# Patient Record
Sex: Male | Born: 1985 | Race: White | Hispanic: No | Marital: Married | Smoking: Never smoker
Health system: Southern US, Community
[De-identification: ages and names within clinical notes are randomized; demographics above are authoritative.]

## PROBLEM LIST (undated history)

## (undated) DIAGNOSIS — F32A Depression, unspecified: Secondary | ICD-10-CM

## (undated) DIAGNOSIS — B019 Varicella without complication: Secondary | ICD-10-CM

## (undated) DIAGNOSIS — E11319 Type 2 diabetes mellitus with unspecified diabetic retinopathy without macular edema: Secondary | ICD-10-CM

## (undated) DIAGNOSIS — E119 Type 2 diabetes mellitus without complications: Secondary | ICD-10-CM

## (undated) DIAGNOSIS — F419 Anxiety disorder, unspecified: Secondary | ICD-10-CM

## (undated) DIAGNOSIS — F329 Major depressive disorder, single episode, unspecified: Secondary | ICD-10-CM

## (undated) DIAGNOSIS — F509 Eating disorder, unspecified: Secondary | ICD-10-CM

## (undated) HISTORY — PX: LASIK: SHX215

## (undated) HISTORY — DX: Major depressive disorder, single episode, unspecified: F32.9

## (undated) HISTORY — DX: Varicella without complication: B01.9

## (undated) HISTORY — DX: Anxiety disorder, unspecified: F41.9

## (undated) HISTORY — PX: WISDOM TOOTH EXTRACTION: SHX21

## (undated) HISTORY — DX: Type 2 diabetes mellitus with unspecified diabetic retinopathy without macular edema: E11.319

## (undated) HISTORY — DX: Depression, unspecified: F32.A

## (undated) HISTORY — PX: APPENDECTOMY: SHX54

## (undated) HISTORY — DX: Eating disorder, unspecified: F50.9

---
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
## 2012-06-26 ENCOUNTER — Ambulatory Visit (INDEPENDENT_AMBULATORY_CARE_PROVIDER_SITE_OTHER): Payer: Self-pay | Admitting: Family Medicine

## 2012-06-26 VITALS — Wt 174.0 lb

## 2012-06-26 DIAGNOSIS — E119 Type 2 diabetes mellitus without complications: Secondary | ICD-10-CM

## 2012-06-26 NOTE — Progress Notes (Signed)
Patient presents today for 3 mo T1DM follow-up through the employer-sponsored [employer] program. Current DM regimen includes novolog via insulin pump. Patient continues using the Tandem T:slim pump, One Touch Verio meter, and Dexcom CGM. Patient recently began seeing a new endocrinologist, Dr. Sharl Ma. Initial visit was this past January, pt will return to endo today for follow-up. MD is requesting to see pt more often while making insulin adjustments and while getting acquainted with new office and staff. No new medications at this time. No med issues.   Diabetes Review: Highest CBG low 300s; Lowest CBG 60; Type of Diabetes: Type 1; Most recent A1c - 6.4 via Dr. Daune Perch office in mid January.   Other Diabetes History: Pt did not bring his meter in with him today, but reports the above highs and lows. Patient attributes his recent hyperglycemia to illness this past January. He also notes nighttime hypoglycemia in the 60s, which sometimes leads to spiking after correction. Patient continues testing 10-12 times daily, unless wearing the CGM, in this case he requires testing only 4-5 times daily. Dr. Sharl Ma is working with patient to adjust basal rates for tighter control and elimination of night time hypoglycemia. After adjusting basal rates, MD and pt will assess need for CHO ratio and ISF adjustments.   Patient does report one pump malfunction over the past 3 months. Pump motor discontinue working and had to be replaced. Approximately 5 days lapsed before patient was able to obtain a new pump. During this time pt transitioned to Lantus and Novolog injections.   Patient did mention that he will request to have his thyroid checked at todays at follow-up given his family hx of hypothyroidism (mother recently diagnosed) and increased risk due to being Type 1 diabetic.  Lifestyle Review: Exercise - no changes to exercise regimen, continues exercising ~5 days per week, biking and strength training.  Diet -  No changes, continues to maintain a healthy diet and continues carb counting.  Assessment:  Pt remains under great control with A1c of 6.4. Goal A1c per Dr. Sharl Ma is <7.0. Patient continues to manage his DM with tight control, maintain excellent exercise routine and diet. 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    Teaching Goals:   1) Continue testing regularly 2) Continue to maintain healthy diet and exercise 3)  Maintain follow-up with new endocrinologist 4) Follow-up in 3 months

## 2012-07-16 NOTE — Progress Notes (Signed)
ATTENDING PHYSICIAN NOTE: I have reviewed the chart and agree with the plan as detailed above. Sara Neal MD Pager [phone]  

## 2012-09-21 ENCOUNTER — Encounter (HOSPITAL_COMMUNITY): Payer: Self-pay

## 2012-09-21 ENCOUNTER — Emergency Department (HOSPITAL_COMMUNITY)
Admission: EM | Admit: 2012-09-21 | Discharge: 2012-09-21 | Disposition: A | Discharge status: Home / Self Care | Payer: 59 | Attending: Emergency Medicine | Admitting: Emergency Medicine

## 2012-09-21 DIAGNOSIS — R42 Dizziness and giddiness: Secondary | ICD-10-CM | POA: Insufficient documentation

## 2012-09-21 DIAGNOSIS — Z79899 Other long term (current) drug therapy: Secondary | ICD-10-CM | POA: Insufficient documentation

## 2012-09-21 DIAGNOSIS — R110 Nausea: Secondary | ICD-10-CM | POA: Insufficient documentation

## 2012-09-21 DIAGNOSIS — Z794 Long term (current) use of insulin: Secondary | ICD-10-CM | POA: Insufficient documentation

## 2012-09-21 DIAGNOSIS — R259 Unspecified abnormal involuntary movements: Secondary | ICD-10-CM | POA: Insufficient documentation

## 2012-09-21 DIAGNOSIS — E1069 Type 1 diabetes mellitus with other specified complication: Secondary | ICD-10-CM | POA: Insufficient documentation

## 2012-09-21 DIAGNOSIS — R61 Generalized hyperhidrosis: Secondary | ICD-10-CM | POA: Insufficient documentation

## 2012-09-21 DIAGNOSIS — R5381 Other malaise: Secondary | ICD-10-CM | POA: Insufficient documentation

## 2012-09-21 HISTORY — DX: Type 2 diabetes mellitus without complications: E11.9

## 2012-09-21 MED ORDER — DEXTROSE-NACL 5-0.9 % IV SOLN
Freq: Once | INTRAVENOUS | Status: AC
  Administered 2012-09-21: 14:00:00 via INTRAVENOUS

## 2012-09-21 NOTE — ED Provider Notes (Signed)
History     CSN: [id number]  Arrival date & time 09/21/12  1332   None     Chief Complaint  Patient presents with  . Hypoglycemia    (Consider location/radiation/quality/duration/timing/severity/associated sxs/prior treatment) HPI  27 year old male presents emergency department for hypoglycemia.  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  He has a history of type I insulin-dependent diabetes mellitus and uses an insulin pump.  The patient recently had his pump replaced after it was destroyed in radiology.  Patient states that he was reprogramming it to give him his basal dose this morning which is usually 0.9 units.  Patient states that his blood sugar kept dropping and when he took it off to go to radiology he noticed that it was set to 9 units per hour.  She did become diaphoretic and nauseated.  He was also very shaky.  Patient drinks several Coca-Cola as and came to the emergency department for monitoring.  He denies any other symptoms at this time.  No past medical history on file.  No past surgical history on file.  No family history on file.  History  Substance Use Topics  . Smoking status: Not on file  . Smokeless tobacco: Not on file  . Alcohol Use: Not on file      Review of Systems  Constitutional: Positive for diaphoresis. Negative for fever and chills.  Respiratory: Negative for cough and shortness of breath.   Cardiovascular: Negative for chest pain and palpitations.  Gastrointestinal: Positive for nausea. Negative for vomiting, abdominal pain, diarrhea and constipation.  Genitourinary: Negative for dysuria, urgency and frequency.  Musculoskeletal: Negative for myalgias and arthralgias.  Skin: Negative for rash.  Neurological: Positive for tremors, weakness and light-headedness. Negative for headaches.    Allergies  Review of patient's allergies indicates not on file.  Home Medications   Current Outpatient Rx  Name  Route  Sig   Dispense  Refill  . insulin aspart (NOVOLOG) 100 UNIT/ML injection   Subcutaneous   Inject into the skin. Use as directed via insulin pump         . sertraline (ZOLOFT) 50 MG tablet   Oral   Take 50 mg by mouth daily.           There were no vitals taken for this visit.  Physical Exam  Nursing note and vitals reviewed. Constitutional: He is oriented to person, place, and time. He appears well-developed and well-nourished. No distress.  Fit, well-appearing  HENT:  Head: Normocephalic and atraumatic.  Eyes: Conjunctivae are normal. No scleral icterus.  Neck: Normal range of motion. Neck supple.  Cardiovascular: Normal rate, regular rhythm and normal heart sounds.   Pulmonary/Chest: Effort normal and breath sounds normal. No respiratory distress.  Abdominal: Soft. There is no tenderness.  Musculoskeletal: He exhibits no edema.  Neurological: He is alert and oriented to person, place, and time.  Tremulous  Skin: Skin is warm and dry. He is not diaphoretic.  Psychiatric: His behavior is normal.    ED Course  Procedures (including critical care time)  Labs Reviewed - No data to display No results found.   No diagnosis found.    MDM  2:33 PM Filed Vitals:   09/21/12 1400  BP: 112/58  Pulse: 70  Temp:   Resp:    Patient here for hypoglycemia 2 to insulin overdose from Mis-programming of his insulin pump.  Patient has removed his insulin pump and is receiving D50 infusion.  He is currently eating his 1 chest well.  He is on blood glucose monitoring.  Last check was 79 mg per decaliter.  We'll continue to monitor the patient's blood sugar.   CBG (last 3)   Recent Labs  09/21/12 1343 09/21/12 1452 09/21/12 1626  GLUCAP 79 206* 294*    Patient blood glucose appears to be stabilizing.He has restarted his basal insulin. No tremors, N/V. Will d.c home and f/u with PCP. The patient appears reasonably screened and/or stabilized for discharge and I doubt any other  medical condition or other [facility] requiring further screening, evaluation, or treatment in the ED at this time prior to discharge.     Arthor Captain, PA-C 09/21/12 1643

## 2012-09-21 NOTE — ED Notes (Signed)
Pt states he set his insulin pump too high today by accident. C/o diaphoresis, general malaise. Pt drank several cokes today to keep up blood sugar.

## 2012-09-21 NOTE — ED Notes (Addendum)
Pt states his cbg is trending down on his pump. Resting comfortably, eating lunch. D5% set to bolus per abigail, PA

## 2012-09-22 NOTE — ED Provider Notes (Signed)
Medical screening examination/treatment/procedure(s) were performed by non-physician practitioner and as supervising physician I was immediately available for consultation/collaboration.   Staisha Winiarski L Trayshawn Durkin, MD 09/22/12 1247 

## 2012-10-10 ENCOUNTER — Ambulatory Visit (INDEPENDENT_AMBULATORY_CARE_PROVIDER_SITE_OTHER): Payer: Self-pay | Admitting: Family Medicine

## 2012-10-10 VITALS — BP 106/68 | Wt 164.0 lb

## 2012-10-10 DIAGNOSIS — E119 Type 2 diabetes mellitus without complications: Secondary | ICD-10-CM

## 2012-10-10 LAB — BASIC METABOLIC PANEL
BUN: 13 (ref 4–21)
Creatinine: 0.9 (ref 0.6–1.3)
GLUCOSE: 144
Sodium: 140 (ref 137–147)

## 2012-10-10 LAB — TSH: TSH: 1.98 (ref 0.41–5.90)

## 2012-10-10 NOTE — Progress Notes (Signed)
Patient presents today for 3 month T1DM follow-up as part of the employer-sponsored [employer] to Well[employer] program. Patient continues being followed by Dr. Sharl Ma, endo, and is pleased with his care thus far. He will follow-up with Dr. Sharl Ma today after this visit. No med changes at this time. Patient's thyroid function tests revealed hypothyroidism, but treatment has not yet been initiated. MD will continue to follow and will discuss treatment options with patient as this becomes necessary. Patient is asymptomatic at this time.   Diabetes Assessment: Diabetes: Year of diagnosis 3 yrs of age; checks feet daily; takes medications as prescribed; uses glucometer; does not take an aspirin a day; checks blood glucose more than 4 times a day 10-12 times per day. Checking less while wearing CGM.; hypoglycemia frequency infrequent; MD managing Diabetes Dr. Sharl Ma, endo; Type of Diabetes: Type 1; Highest CBG 260; Lowest CBG 60s; Sees Diabetes provider 3 times per year This varies, normally sees MD q4 mo, but sometimes more often, sometimes less often. Other Diabetes History: Current regimen includes novolog via pump. Patient has recently switched to Medtronic pump and is no longer using Tandem T:slim. Patient has experienced multiple malfunctions of the t-slim pump including motor failure, system shut-down causing loss of all basal rates, and most recently inadequate alerts for basal rate error. About 3 weeks ago, patient adjusted basal rate as instructed by MD to 0.9 units per hour, in error the rate was entered as 9 units per hour and pump did not provide an alert or auto-stop feature indicating this was an innaccurate rate. As a result patient began experiencing hypoglycemic episodes, disconnected pump for ~1 hour and began treating the hypoglycemia, glucose continued to drop after re-connecting pump, patient attempted to verify rate and at this time discovered the incorrect basal rate. He was excorted to the ED (patient is a  Insurance underwriter at [employer]) and was treated there with IV dextrose. Patient is not pleased with quality of t-slim pump and has been working with their reps and engineers to improve the build of this pump. As a result of this error, he has opted to switch to Medtronic which offers a pump with alerts and even auto-shut off for basal rates in excess of 2 units per hour. Thus far patient is pleased with this new pump. He also continues using Dexcom CGM. Glucose monitoring occurs 10-12 times daily when not using CGM and 4-6 times daily when wearing CGM. Patient did not bring meter in today but reports highest reading of 260 over previous month and lowest reading (other than due to pump error) of 60s. Patient is aware of how to appropriately correct hypoglycemia. MD, Dr. Sharl Ma continues working with patient to adjust basal rates, no changes to ISF or carb ratio at this time. Patient is due for yearly eye exam and will schedule an appt soon. He generally has eye exam yearly around his birthdate.   Lifestyle Factors: Exercise - no changes to exercise regimen, continues exercising ~5 days per week, biking and strength training.  Diet - No changes, continues to maintain a healthy diet and carb counting.  Assessment: Pt remains under great control and will have A1c checked today at follow-up with Dr. Sharl Ma. Goal A1c per Dr. Sharl Ma is <7.0. Patient continues to manage his DM with tight control, maintain excellent exercise routine and diet. 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   Plan: 1) Continue to maintain exercise regimen 2) Continue to maintain a healthy diet 3) Continue testing regularly  4) Schedule an eye exam 5) Follow-up in 3 months on Wednesday August 27th @ 9:00 am

## 2012-11-05 NOTE — Progress Notes (Signed)
Patient ID: Mark Barr, male   DOB: 12-11-1985, 27 y.o.   MRN: [id number] ATTENDING PHYSICIAN NOTE: I have reviewed the chart and agree with the plan as detailed above. Denny Levy MD Pager [phone]

## 2012-11-27 ENCOUNTER — Encounter: Payer: Self-pay | Admitting: *Deleted

## 2012-11-27 ENCOUNTER — Encounter: Payer: 59 | Attending: Internal Medicine | Admitting: *Deleted

## 2012-11-27 VITALS — Ht 69.0 in | Wt 166.8 lb

## 2012-11-27 DIAGNOSIS — E119 Type 2 diabetes mellitus without complications: Secondary | ICD-10-CM

## 2012-11-27 DIAGNOSIS — Z713 Dietary counseling and surveillance: Secondary | ICD-10-CM | POA: Insufficient documentation

## 2012-11-27 DIAGNOSIS — Z9641 Presence of insulin pump (external) (internal): Secondary | ICD-10-CM | POA: Insufficient documentation

## 2012-11-27 DIAGNOSIS — E109 Type 1 diabetes mellitus without complications: Secondary | ICD-10-CM | POA: Insufficient documentation

## 2012-11-27 DIAGNOSIS — F509 Eating disorder, unspecified: Secondary | ICD-10-CM

## 2012-11-27 NOTE — Progress Notes (Signed)
Patient was seen on 11/27/12 for nutrition counseling pertaining to disordered eating  1990 spencer was diagnosed with Type 1 diabetes.  Currently on Animus pump.  Most recent HbA1c is 6.7%.  Diabetes is well-controlled, although current A1c is higher than previous of 6.4%.    Primary care MD: none, Dr. Sharl Ma for endocrinology. 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  [name] went to [employer] for football.  His eating disorder began in college Previous treatments: Saw therapist regularly in college and saw RD around the same time.  These were positive experiences.  The RD initiated food challenges and intuitive eating.  [name] likes to eat more on a schedule, regardless of his hunger/satiety cues.  There has been about a 2 year gap since [place] received any type of treatment for his ED and lately things in his life have spiraled out of control. Since working with Noni Saupe, he has reduced his purging episodes and is open to receiving treatment for his ED.  He currently doesn't have an PCP and he is looking for one.  He also is thinking about switching endocrinologist to Dr. Elvera Lennox after he has paid for his new pump with Dr. Sharl Ma Goals for RD meetings: to fix all this.  He realizes it will take time to heal and restore weight.  He would like to know how much he needs to eat to provide fuel for his exercise  Weight history:  Highest weight: 200-205 lb when playing football  Lowest weight: 145 lb  Most consistent weight: 160-165    What would you like to weight:160 How has weight changed in the past year: gained muscle mass when he started weight training, then dropped 10 pounds in 3 months.  His lowest was 158.  His endocrinologist stated something to the effect that [name]'s weight of 175 (put on a lot of muscle mass from weight training)  was too much for his frame and his BMI was too high.  [name] then dropped 10 pound and Dr. Sharl Ma was pleased.  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  [name] states that he receives regular medical care from Dr. Sharl Ma.  He denies any adverse symptoms, but he has not had regular lab work.  He presents today with dry, cracked lips which could be indicative of malnutrition/dehydration Changes in hair, skin, nails since ED started: NA Sometimes has ulcer as result of purging and episodes of fatigue as a result of negative energy balance Chewing/swallowing difficulties: NA Relux or heartburn: acid reflux with heavy meal.  Not currently on medication.  nothing OTC Trouble with teeth: not due to ED, but does grind teeth.   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  With water.   2 pm: peanut butter sandwich or ham and cheese sandwich.  Cup of sunchips.  Apple or grapes.  Carrots and maybe fruit snacks Might have sprite  Or juice if hypoglycemic 8:30 pm: varies- roast beef with 1/2 cup rice;  Grilled chicken.  Maybe casserole or might grill meat  Might have pack of crackers if hypoglycemic  When not working: B: Bagel and eggs S: Some type of snacks: fruits, peanut butter cracker,  trail mix L: Sandwich and fruit S: Cliff bar or another bar while riding (3 hr on mountain bike 27 mi) D: Dinner varies S: fudgesicle   Dinner time is the hardest because it's a bigger meal and he might not have exercised.  He has less control over dinner.  He is more anxious when his wife cooks.  He will deliberatly undercook food so he can't eat much  Compensatory behaviors           Restricting (calories, fat, carbs)  SIV: 3-4 times in 2 weeks recent.  Used to purge BID  Diet pills: no  Laxatives: no  Diuretics: no  Alcohol or drugs: none  Exercise (what type):  Run, bike, lift weights  6-7 days.  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  As evidenced by dietary recall.  Intervention: Discussed outpatient ED treatment: regular visits to therapist, dietitian, and medical doctor.  Since [place] doesn't have a PCP currently, he will need to find one for monthly lab workup.  Discussed calorie needs vs what he is actually consuming and discussed needs for weight restoration.  Suggested adding calories in 150 kcal/day- 300 kcal/day intervals.  Gave food log for work days and off days to track binge/purge episodes.  Suggested protein snacks during the morning and afternoon.  Suggested cheese, nuts, bars, etc.  Nuri also agreed to cut back on his workout to walking in the morning, instead of running.    Monitoring and Evaluation: Patient will follow up in 1 weeks.

## 2012-11-27 NOTE — Patient Instructions (Addendum)
Ride with dad instead of group at slower pace. Walk for cardio on work days instead of running.  continue lifting as reported.    Continue snacking on almonds.  10-15 every couple of times Somehow try to get snack in around 4-5.  Include starch and protein  Keep food/exercise log for 2 work days and 2 off days

## 2012-12-05 ENCOUNTER — Encounter: Payer: 59 | Admitting: *Deleted

## 2012-12-05 DIAGNOSIS — F509 Eating disorder, unspecified: Secondary | ICD-10-CM

## 2012-12-05 NOTE — Patient Instructions (Addendum)
Add string cheese, sliced cheese, Baby Bell on crackers- your choice Kefir -beverage in the yogurt aisle Almonds Beef jerky Greek yogurt Peanut Butter crackers Keep riding with dad Keep weight lifting Consider cutting back on cardio on weight days Aim for 6-7 hours of sleep each night Continue to drink water throughout the day  Keep fruit as a snack, but add nuts or cheese.  Aim for 2-3 snacks.

## 2012-12-05 NOTE — Progress Notes (Signed)
Patient was seen on 12/05/12 for follow up nutrition counseling pertaining to disordered eating  1990 Mark Barr was diagnosed with Type 1 diabetes.  Currently on Animus pump.  Most recent HbA1c is 6.7%.  Diabetes is well-controlled, although current A1c is higher than previous of 6.4%.    Primary care MD: none, Dr. Sharl Ma for endocrinology. 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  Mark Barr went to [employer] for football.  His eating disorder began in college Previous treatments: Saw therapist regularly in college and saw RD around the same time.  These were positive experiences.  The RD initiated food challenges and intuitive eating.  Mark Barr likes to eat more on a schedule, regardless of his hunger/satiety cues.  There has been about a 2 year gap since Mark Barr received any type of treatment for his ED and lately things in his life have spiraled out of control. Since working with Noni Saupe, he has reduced his purging episodes and is open to receiving treatment for his ED.  He currently doesn't have an PCP and he is looking for one.  He also is thinking about switching endocrinologist to Dr. Elvera Lennox after he has paid for his new pump with Dr. Sharl Ma Goals for RD meetings: to fix all this.  He realizes it will take time to heal and restore weight.  He would like to know how much he needs to eat to provide fuel for his exercise  Weight history:  Highest weight: 200-205 lb when playing football  Lowest weight: 145 lb  Most consistent weight: 160-165    What would you like to weight:160 How has weight changed in the past year: gained muscle mass when he started weight training, then dropped 10 pounds in 3 months.  His lowest was 158.  His endocrinologist stated something to the effect that Mark Barr's weight of 175 (put on a lot of muscle mass from weight  training) was too much for his frame and his BMI was too high.  Mark Barr then dropped 10 pound and Dr. Sharl Ma was pleased.  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  Mark Barr states that he receives regular medical care from Dr. Sharl Ma.  He denies any adverse symptoms, but he has not had regular lab work.  He presents today with dry, cracked lips which could be indicative of malnutrition/dehydration Changes in hair, skin, nails since ED started: NA Sometimes has ulcer as result of purging and episodes of fatigue as a result of negative energy balance Chewing/swallowing difficulties: NA Relux or heartburn: acid reflux with heavy meal.  Not currently on medication.  nothing OTC Trouble with teeth: not due to ED, but does grind teeth.   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  With water.   2 pm: peanut butter sandwich or ham and cheese sandwich.  Cup of sunchips.  Apple or grapes.  Carrots and maybe fruit snacks Might have sprite  Or juice if hypoglycemic 8:30 pm: varies- roast beef with 1/2 cup rice;  Grilled chicken.  Maybe casserole or might grill meat  Might have pack of crackers if hypoglycemic  When not working: B: Bagel and eggs S: Some type of snacks: fruits, peanut butter  cracker, trail mix L: Sandwich and fruit S: Cliff bar or another bar while riding (3 hr on mountain bike 29 mi) D: Dinner varies S: fudgesicle   Dinner time is the hardest because it's a bigger meal and he might not have exercised.  He has less control over dinner.  He is more anxious when his wife cooks.  He will deliberatly undercook food so he can't eat much  Compensatory behaviors           Restricting (calories, fat, carbs)  SIV: 1 time this week.  Used to purge BID  Diet pills: no  Laxatives: no  Diuretics: no  Alcohol or drugs: none  Exercise (what type):  Run, bike, lift weights  6-7 days.  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  As evidenced by dietary recall.  Intervention: Praised Luigi for his progress.  He has cut back on his exercise and his purging.  He has made good progress.  He had several situations where he could have purged or restricted and he didn't.  He added the almonds as a snack like we discussed and he denies negative thoughts or ramifications from increasing his intake.  He's really come a long way in a week :-)  Since Mark Barr still doesn't have a PCP currently, he will need to find one soon for monthly lab workup.  I got labs from Dr. Sharl Ma, but there isn't a complete metabolic panel; Dr. Sharl Ma is understandably more interested in Mark Barr's diabetes and his endocrine-related labs.    Discussed calorie needs vs what he is actually consuming and discussed needs for weight restoration.  Suggested adding calories in 150 kcal/day- 300 kcal/day intervals.    Suggested protein snacks during the morning and afternoon.  Suggested cheese, nuts, bars, yogurt,  etc.  [name] also agreed to cut back on his workout to walking in the morning, instead of running.  Encouraged him to find time to relax; he likes to read.  He is also going on vacation next week.  Encouraged him to make the time to relax and enjoy himself and to limit running to 2 days  Monitoring and Evaluation: Patient will follow up in 2 weeks.

## 2012-12-18 ENCOUNTER — Ambulatory Visit: Payer: 59 | Admitting: *Deleted

## 2012-12-19 ENCOUNTER — Encounter: Payer: 59 | Attending: Internal Medicine | Admitting: *Deleted

## 2012-12-19 VITALS — Ht 69.0 in | Wt 170.0 lb

## 2012-12-19 DIAGNOSIS — Z9641 Presence of insulin pump (external) (internal): Secondary | ICD-10-CM | POA: Insufficient documentation

## 2012-12-19 DIAGNOSIS — F509 Eating disorder, unspecified: Secondary | ICD-10-CM

## 2012-12-19 DIAGNOSIS — E109 Type 1 diabetes mellitus without complications: Secondary | ICD-10-CM | POA: Insufficient documentation

## 2012-12-19 DIAGNOSIS — Z713 Dietary counseling and surveillance: Secondary | ICD-10-CM | POA: Insufficient documentation

## 2012-12-19 NOTE — Progress Notes (Signed)
Patient was seen on 12/19/12 for follow up nutrition counseling pertaining to disordered eating  1990 Mark Barr was diagnosed with Type 1 diabetes.  Currently on Animus pump.  Most recent HbA1c is 6.7%.  Diabetes is well-controlled, although current A1c is higher than previous of 6.4%.    Primary care MD: Denton Ar at [facility]- has appt to establish primary care on 01/07/13 Endocrinologist: Dr. Sharl Ma currently.  Plans to switch to Dr. Elvera Lennox Therapist: Noni Saupe  Any other medical team members: unknown psychiatrist   3 purges last week, calories moderation if not able to exercise.   Assessment  Eating history:  Length of time: disordered eating began in college.  [name] went to [employer] for football.  His eating disorder began in college Previous treatments: Saw therapist regularly in college and saw RD around the same time.  These were positive experiences.  The RD initiated food challenges and intuitive eating.  [name] likes to eat more on a schedule, regardless of his hunger/satiety cues.  There has been about a 2 year gap since [place] received any type of treatment for his ED and lately things in his life have spiraled out of control. Since working with Noni Saupe, he has reduced his purging episodes and is open to receiving treatment for his ED.  He currently doesn't have an PCP and he is looking for one.  He also is thinking about switching endocrinologist to Dr. Elvera Lennox after he has paid for his new pump with Dr. Sharl Ma Goals for RD meetings: to fix all this.  He realizes it will take time to heal and restore weight.  He would like to know how much he needs to eat to provide fuel for his exercise  Weight history:  Highest weight: 200-205 lb when playing football  Lowest weight: 145 lb  Most consistent weight: 160-165    What would you like to weigh:160 How has weight changed in the past year: gained muscle mass when he started weight training, then dropped 10 pounds in 3  months.  His lowest was 158.  His endocrinologist stated something to the effect that Mark Barr's weight of 175 (put on a lot of muscle mass from weight training) was too much for his frame and his BMI was too high.  Mark Barr then dropped 10 pound and Dr. Sharl Ma was pleased.  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  He has made an appointment to establish primary care.  [name] states that he receives regular medical care from Dr. Sharl Ma.  He denies any adverse symptoms, but he has not had regular lab work.  He presents today with dry, cracked lips which could be indicative of malnutrition/dehydration Changes in hair, skin, nails since ED started: NA Sometimes has ulcer as result of purging and episodes of fatigue as a result of negative energy balance Chewing/swallowing difficulties: NA Relux or heartburn: acid reflux with heavy meal.  Not currently on medication.  nothing OTC Trouble with teeth: not due to ED, but does grind teeth.   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  With water.   2 pm: peanut butter sandwich or ham and cheese sandwich.  Cup of sunchips.  Apple or grapes.  Carrots and maybe fruit snacks Might have sprite  Or juice if hypoglycemic 8:30 pm: varies- roast beef  with 1/2 cup rice;  Grilled chicken.  Maybe casserole or might grill meat  Might have pack of crackers if hypoglycemic  When not working: B: Bagel and eggs S: Some type of snacks: fruits, peanut butter cracker, trail mix L: Sandwich and fruit S: Cliff bar or another bar while riding (3 hr on mountain bike 8 mi) D: Dinner varies S: fudgesicle   Dinner time is the hardest because it's a bigger meal and he might not have exercised.  He has less control over dinner.  He is more anxious when his wife cooks.  He will deliberatly undercook food so he can't eat much  Compensatory behaviors           Restricting (calories, fat, carbs)  SIV: 3 time this week.  Used to purge BID, purging varies each week  Diet pills: no  Laxatives: no  Diuretics: no  Alcohol or drugs: none  Exercise (what type):  Run, bike, lift weights  6-7 days.  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  As evidenced by dietary recall.  Intervention: Praised [place] for his progress.  He has cut back on his exercise and his purging. He was not able to exericse as much thi spast week due to the weather, but he did not try and force an exercise.  He did eat less and admits to purging this past week.  He has made good progress.  He had 2 situations where he could have purged or restricted and he didn't.  He believes that it was sheer will-power that stopped him on those days.  He added the almonds as a snack like we discussed and he denies negative thoughts or ramifications from increasing his intake.  He has hypoglycemia maybe once a day, but he tries to snack often to prevent this.  He mainly restricts his intake at dinner if he feels  like he hasn't exercise enough or ate too much that day  Discussed calorie needs vs what he is actually consuming and discussed needs for weight restoration.  Assured Matan that he will not become obese by adding in the nutrition he needs to be healthy.  Discussed intuitive eating and what the ultimate goals are with his relationship with food.  This will take time and patience, but he will get there.  He is motivated and educated and is honestly trying to move towards health.    Discussed the difference between his own thoughts and his ED thoughts.  He has two voices fighting against each other.  We need to make his positive voice louder than his ED voice.  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  His homework for this week is to ask Lowella Bandy what she likes abotu his appearance and his personality.  If he has disparaging thoughts, he must think of 3 affirming things.  If he is unable to think of 3 things, he's not allowed to say anything disparaging. Instead, tell that ED voice to shut up.   Distinguish health thoughts from negative, wrong thoughts.  Make that distinction and make the positive voice as loud as the negative one   Monitoring and Evaluation: Patient will follow up in 1 weeks.

## 2012-12-20 ENCOUNTER — Encounter: Payer: Self-pay | Admitting: *Deleted

## 2012-12-26 ENCOUNTER — Ambulatory Visit: Payer: 59 | Admitting: *Deleted

## 2013-01-01 ENCOUNTER — Encounter: Payer: 59 | Admitting: *Deleted

## 2013-01-01 DIAGNOSIS — E119 Type 2 diabetes mellitus without complications: Secondary | ICD-10-CM

## 2013-01-01 DIAGNOSIS — F509 Eating disorder, unspecified: Secondary | ICD-10-CM

## 2013-01-01 NOTE — Progress Notes (Signed)
Patient was seen on 01/01/13 for follow up nutrition counseling pertaining to disordered eating  Mark Barr was diagnosed with Type 1 diabetes.  Currently on Animus pump.  Most recent HbA1c is 6.7%.  Diabetes is well-controlled, although current A1c is higher than previous of 6.4%.    Primary care MD: Denton Ar at [place] [place]- has appt to establish primary care on 01/07/13 Endocrinologist: Dr. Sharl Ma currently.  Plans to switch to Dr. Elvera Lennox Therapist: Noni Saupe  Any other medical team members: unknown psychiatrist   1-2 purges last week, calories moderation if not able to exercise.   Assessment  School all day Wednesdays starting tomorrow.  Has appointment with Noni Saupe on Thursday.  I hope he will be able to game-plan on coping mechanisms for the added stress of school.  He's been pretty busy lately and hasn't had as much time to exercise or purge!  There have been maybe  A few instances when he felt bad about his weight and wanted to do something, but he didn't.  He reports really trying to take control of his weight.  He understands the health ramifications and is trying to be healthy.  He has an appointment with Dr. Caryl Never next week.  He plans on making an appointment with Dr. Elvera Lennox, but he's been so busy with other appointments, he hasn't made the time.  His diabetes is well-controlled, and that's not a pressing issue right now.    Eating history:  Length of time: disordered eating began in college.  Mark Barr went to [employer] for football.  His eating disorder began in college Previous treatments: Saw therapist regularly in college and saw RD around the same time.  These were positive experiences.  The RD initiated food challenges and intuitive eating.  Mark Barr likes to eat more on a schedule, regardless of his hunger/satiety cues.  There has been about a 2 year gap since [place] received any type of treatment for his ED and lately things in his life have spiraled out of  control. Since working with Noni Saupe, he has reduced his purging episodes and is open to receiving treatment for his ED.  He currently doesn't have an PCP and he is looking for one.  He also is thinking about switching endocrinologist to Dr. Elvera Lennox after he has paid for his new pump with Dr. Sharl Ma Goals for RD meetings: to fix all this.  He realizes it will take time to heal and restore weight.  He would like to know how much he needs to eat to provide fuel for his exercise  Weight history:  Highest weight: 200-205 lb when playing football  Lowest weight: 145 lb  Most consistent weight: 160-165    What would you like to weigh:160 How has weight changed in the past year: gained muscle mass when he started weight training, then dropped 10 pounds in 3 months.  His lowest was 158.  His endocrinologist stated something to the effect that Barr's weight of 175 (put on a lot of muscle mass from weight training) was too much for his frame and his BMI was too high.  Barr then dropped 10 pound and Dr. Sharl Ma was pleased.  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  He has made an appointment to establish primary care.  Mark Barr  states that he receives regular medical care from Dr. Sharl Ma.  He denies any adverse symptoms, but he has not had regular lab work.  He presents today with dry, cracked lips which could be indicative of malnutrition/dehydration Changes in hair, skin, nails since ED started: NA Sometimes has ulcer as result of purging and episodes of fatigue as a result of negative energy balance Chewing/swallowing difficulties: NA Relux or heartburn: acid reflux with heavy meal.  Not currently on medication.  nothing OTC Trouble with teeth: not due to ED, but does grind teeth.   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  With water. Oatmeal cookie   2 pm: peanut butter and j sandwich .  Cup of sunchips.  Apple or grapes.  Carrots and maybe fruit snacks 8:30 pm: shrimp stir fry with rice and vegetables fudgesicle Coke with hypo  When not working: B: Bagel and eggs S: Some type of snacks: fruits, peanut butter cracker, trail mix L: Sandwich and fruit S: Cliff bar or another bar while riding (3 hr on mountain bike 35 mi) D: Dinner varies S: fudgesicle   Dinner time is the hardest because it's a bigger meal and he might not have exercised.  He has less control over dinner.  He is more anxious when his wife cooks.  He will deliberatly undercook food so he can't eat much  Compensatory behaviors           Restricting (calories, fat, carbs)  SIV: 0-2 time this week.  Used to purge BID, purging varies each week  Diet pills: no  Laxatives: no  Diuretics: no  Alcohol or drugs: none  Exercise (what type):  Run, bike, lift weights  6-7 days.  3 days run and lift (1.5 hr) 4 days rides for 3 hr and might lift another day;  Also  walks dog most days.  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  As evidenced by dietary recall.  Intervention: Praised [employer] for his progress.  He has cut back on  his exercise and his purging. He was not able to exericse as much this past week due to the weather and schedule, but he did not try and force an exercise.   He has made good progress.  He had 2 situations where he could have purged or restricted and he didn't.  He believes that it was sheer will-power that stopped him on those days.   He mainly restricts his intake at dinner if he feels like he hasn't exercise enough or ate too much that day.  Suggested taking dogs for walk after dinner to ease anxiety.  Do no restrict food intake, but rather go for leisurely walk.  Discussed calorie needs vs what he is actually consuming and discussed needs for weight restoration.  Assured Mark Barr that he will not become obese by adding in the nutrition he needs to be healthy.  Discussed intuitive eating and what the ultimate goals are with his relationship with food.  This will take time and patience, but he will get there.  He is motivated and educated and is honestly trying to move towards health.    Discussed the difference between his own thoughts and his ED thoughts.  He has two voices fighting against each other.  We need to make his positive voice louder than his ED voice.  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  His homework for this week is to ask Mark Barr what she likes about his appearance and his personality.  If he has disparaging thoughts, he must think of 3 affirming things.  If he is unable to think of 3 things, he's not allowed to say anything  disparaging. Instead, tell that ED voice to shut up.   Distinguish health thoughts from negative, wrong thoughts.  Make that distinction and make the positive voice as loud as the negative one.  Treat himself like one of his patients.  Fight against the disorder and take it seriously.  Look for volunteer opportunities for days off.  Reduce exercise time and add volunteer time for fulfillment.    Monitoring and Evaluation: Patient will follow up in 2 weeks.

## 2013-01-07 ENCOUNTER — Encounter: Payer: Self-pay | Admitting: Family Medicine

## 2013-01-07 ENCOUNTER — Ambulatory Visit (INDEPENDENT_AMBULATORY_CARE_PROVIDER_SITE_OTHER): Payer: 59 | Admitting: Family Medicine

## 2013-01-07 VITALS — BP 116/68 | HR 68 | Temp 97.7°F | Ht 69.0 in | Wt 171.0 lb

## 2013-01-07 DIAGNOSIS — F329 Major depressive disorder, single episode, unspecified: Secondary | ICD-10-CM

## 2013-01-07 DIAGNOSIS — F502 Bulimia nervosa, unspecified: Secondary | ICD-10-CM

## 2013-01-07 DIAGNOSIS — F32A Depression, unspecified: Secondary | ICD-10-CM | POA: Insufficient documentation

## 2013-01-07 DIAGNOSIS — F341 Dysthymic disorder: Secondary | ICD-10-CM

## 2013-01-07 DIAGNOSIS — E109 Type 1 diabetes mellitus without complications: Secondary | ICD-10-CM

## 2013-01-07 DIAGNOSIS — K219 Gastro-esophageal reflux disease without esophagitis: Secondary | ICD-10-CM

## 2013-01-07 MED ORDER — PANTOPRAZOLE SODIUM 40 MG PO TBEC: 40.0000 mg | DELAYED_RELEASE_TABLET | Freq: Every day | ORAL | Status: DC

## 2013-01-07 NOTE — Progress Notes (Signed)
  Subjective:    Patient ID: Mark Barr, male    DOB: 1985/11/06, 27 y.o.   MRN: [id number]  HPI Patient is new to establish care. He has history of type 1 diabetes which was diagnosed at age 89. He had exceptionally good control over several years. Followed by endocrinologist and has insulin pump. Most recent A1c 6.7%. Other medical problems include history of anxiety/depression, bulimia, reported Hashimoto thyroiditis reportedly euthyroid now, and history of GERD.    He is followed by nutritionist regarding his bulimia. He has about one episode per week. He had recent lab work per endocrinologist including electrolytes which were all normal. No history of anorexia nervosa. Exercises regularly sometimes cycling up to 3 hours per day. No recent hypoglycemia.  He's had some recent reflux-type symptoms. He has previously been protonix. Denies dysphagia or painful swallowing. Appetite and weight have been stable.  He is followed by psychiatry group regarding his depression and is currently on sertraline 100 mg daily and this has been stable  He had previous appendectomy in 2000. Family history significant for mother with hyperlipidemia ,otherwise negative    Review of Systems  Constitutional: Negative for fever, chills, appetite change and unexpected weight change.  HENT: Negative for sore throat, trouble swallowing and voice change.   Respiratory: Negative for cough and shortness of breath.   Cardiovascular: Negative for chest pain.  Gastrointestinal: Negative for abdominal pain.  Endocrine: Negative for polydipsia and polyuria.  Neurological: Negative for dizziness.       Objective:   Physical Exam  Constitutional: He appears well-developed and well-nourished.  HENT:  Mouth/Throat: Oropharynx is clear and moist.  Neck: Neck supple. No thyromegaly present.  Cardiovascular: Normal rate and regular rhythm.   Pulmonary/Chest: Effort normal and breath sounds normal. No  respiratory distress. He has no wheezes. He has no rales.  Abdominal: Soft. Bowel sounds are normal. He exhibits no distension and no mass. There is no tenderness. There is no rebound and no guarding.  Musculoskeletal: He exhibits no edema.  Skin: No rash noted.          Assessment & Plan:  #1 type 1 diabetes. Followed by endocrinology. History of excellent control #2 history of anxiety/depression. Currently stable on sertraline 100 mg daily. Followed by psychiatry #3 GERD. Recent increased symptomatology. Get back on Protonix 40 mg once daily and be in touch in 2 weeks if symptoms not improving #4 history of bulimia. He's had counseling in the past and again is followed by nutrition and psychiatry. Recent electrolytes were normal. No hx of hypokalemia or significant dental erosion.

## 2013-01-21 ENCOUNTER — Encounter: Payer: 59 | Attending: Internal Medicine | Admitting: *Deleted

## 2013-01-21 DIAGNOSIS — Z713 Dietary counseling and surveillance: Secondary | ICD-10-CM | POA: Insufficient documentation

## 2013-01-21 DIAGNOSIS — Z9641 Presence of insulin pump (external) (internal): Secondary | ICD-10-CM | POA: Insufficient documentation

## 2013-01-21 DIAGNOSIS — F502 Bulimia nervosa: Secondary | ICD-10-CM

## 2013-01-21 DIAGNOSIS — E109 Type 1 diabetes mellitus without complications: Secondary | ICD-10-CM | POA: Insufficient documentation

## 2013-01-21 NOTE — Progress Notes (Signed)
Patient was seen on 01/21/13 for follow up nutrition counseling pertaining to disordered eating  1990 spencer was diagnosed with Type 1 diabetes.  Currently on Animus pump.  Most recent HbA1c is 6.7%.  Diabetes is well-controlled, although current A1c is higher than previous of 6.4%.    Primary care MD: Denton Ar at [facility] Endocrinologist: Plans to switch to Dr. Elvera Lennox- has appointment scheduled Therapist: Noni Saupe  Any other medical team members: unknown psychiatrist    Assessment  School all day Wednesdays.  He had a bad week 2 weeks ago when school started back and his schedule didn't allow much time for exercise.  This past week has been better and he feels "back on track" with his eating/exercise.  He is concerned with his HbA1c and TSH levels.  His A1c will be checked at his appointment with Dr. Elvera Lennox.  I recommend he also have his thyroid hormes checked given his history of Hashimoto's Disease.  i'd also like his testosterone levels checked, as well as a complete metabolic panel, including electrolytes, and heart rhythm   Eating history:  Length of time: disordered eating began in college.  Schawn went to [employer] for football.  His eating disorder began in college Previous treatments: Saw therapist regularly in college and saw RD around the same time.  These were positive experiences.  The RD initiated food challenges and intuitive eating.  [name] likes to eat more on a schedule, regardless of his hunger/satiety cues.  There has been about a 2 year gap since Zemple received any type of treatment for his ED and lately things in his life have spiraled out of control. Since working with Noni Saupe, he has reduced his purging episodes and is open to receiving treatment for his ED.  He currently doesn't have an PCP and he is looking for one.  He also is thinking about switching endocrinologist to Dr. Elvera Lennox after he has paid for his new pump with Dr. Sharl Ma Goals for RD  meetings: to fix all this.  He realizes it will take time to heal and restore weight.  He would like to know how much he needs to eat to provide fuel for his exercise  Weight history:  Highest weight: 200-205 lb when playing football  Lowest weight: 145 lb  Most consistent weight: 160-165    What would you like to weigh:160 How has weight changed in the past year: gained muscle mass when he started weight training, then dropped 10 pounds in 3 months.  His lowest was 158.  His endocrinologist stated something to the effect that [name]'s weight of 175 (put on a lot of muscle mass from weight training) was too much for his frame and his BMI was too high.  [name] then dropped 10 pound and Dr. Sharl Ma was pleased.  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 Caryl Never.  No medical problems were identified at that visit, but lab data was evaulated.  [name] had electrolytes tested in May and Dr. Caryl Never found those normal and did not see a need for further data.  He presents today with dry, cracked lips which could be indicative of malnutrition/dehydration Changes in hair, skin, nails since ED started: NA Sometimes has ulcer as result of purging and episodes of fatigue as a result  of negative energy balance Chewing/swallowing difficulties: NA Relux or heartburn: acid reflux with heavy meal.  Not currently on medication.  nothing OTC Trouble with teeth: not due to ED, but does grind teeth.   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  With water. Oatmeal cookie   2 pm: peanut butter and j sandwich .  Cup of sunchips.  Apple or grapes.  Carrots and maybe fruit snacks 8:30 pm: shrimp stir fry with rice and vegetables fudgesicle Coke with hypo  When not working: B: 2 cup cereal S: fudgesicle L: ham and cheese S: cheese and crackers D: teryaki chicken with rice and salad.   S: fudgesicle  ride bike  Dinner time is the hardest because it's a bigger meal and he might not have exercised "enough".  He has less control over dinner.  He is more anxious when his wife cooks.    Compensatory behaviors           Restricting (calories, fat, carbs)  SIV: 0-2 time this week.  Used to purge BID, purging varies each week  Diet pills: no  Laxatives: no  Diuretics: no  Alcohol or drugs: none  Exercise (what type):  Run, bike, lift weights  6-7 days.  3 days run and lift (1.5 hr) 4 days rides for 3 hr and might lift another day;  Also  walks dog most days.  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  As evidenced by dietary recall.  Intervention:  Encouraged patient to reject traditional diet mentality of "good" vs "bad" foods.  There are no good and bad foods, but rather food is fuel that we needs for our bodies.  When we don't get enough fuel, our bodies suffer the metabolic consequences.  Encouraged patient to eat whatever foods will satisfy them, regardless of their nutritional value.  We will discuss nutritional values of foods at a subsequent appointment.  Encouraged patient to honor their  body's internal hunger and fullness cues.  Throughout the day, check in mentally and rate hunger.  Try not to eat when ravenous, but instead when slightly hungry.  Then choose food(s) that will be satisfying regardless of nutritional content.  Sit down to enjoy those foods.  Minimize distractions: turn off tv, put away books, work, Programmer, applications.  Make the meal last at least 20 minutes in order to give time to experience and register satiety.  Stop eating when full regardless of how much food is left on the plate.  Get more if still hungry.  The key is to honor fullness so throughout the meal, rate fullness factor and stop when comfortably full, but not stuffed.  Reminded patient that they can have any food they want, whenever they want, and however much they want.  Eventually the novelty  will wear out and each food will be equal in terms of its emotional appeal.  This will be a learning process and some days more food will be eaten, some days less.  The key is to honor hunger and fullness without any feelings of guilt.  Pay attention to what the internal cues are, rather than any external factors.  Keep up walking after dinner.  Keep log of eating/mood  Monitoring and Evaluation: Patient will follow up in 2 weeks.

## 2013-02-04 ENCOUNTER — Encounter: Payer: 59 | Admitting: *Deleted

## 2013-02-04 DIAGNOSIS — E119 Type 2 diabetes mellitus without complications: Secondary | ICD-10-CM

## 2013-02-04 DIAGNOSIS — F502 Bulimia nervosa: Secondary | ICD-10-CM

## 2013-02-04 NOTE — Progress Notes (Signed)
Patient was seen on 02/04/13 for follow up nutrition counseling pertaining to disordered eating  1990 Mark Barr was diagnosed with Type 1 diabetes.  Currently on Animus pump.  Most recent HbA1c is 6.7%.  Diabetes is well-controlled, although current A1c is higher than previous of 6.4%.    Primary care MD: Mark Barr at [facility] Endocrinologist: Plans to switch to Dr. Elvera Barr- has appointment scheduled Therapist: Noni Barr  Any other medical team members: unknown psychiatrist   Assessment  School all day Wednesdays.  Works 3-4 12 hour shifts and is very stressed.  He hasn't been able to exercise quite as much as we could like, but he hasn't purged quite as much either.  He purged 2 times last month.  His wife is acting as a barrier between him and purging.  He is eating 1400-1600 calories/day.  His goal is ultimately 2500-2500, depending on exercise level.  His weight continues to be stable around 170.  He is ok with Mark Barr acting as a barrier right now, but he predicts that he will get annoyed with her after a few weeks so we might need to find an alternative barrier in the near future.    Eating history:  Length of time: disordered eating began in college.  Mark Barr went to [employer] for football.  His eating disorder began in college Previous treatments: Saw therapist regularly in college and saw RD around the same time.  These were positive experiences.  The RD initiated food challenges and intuitive eating.  Mark Barr likes to eat more on a schedule, regardless of his hunger/satiety cues.  There has been about a 2 year gap since Mark Barr received any type of treatment for his ED and lately things in his life have spiraled out of control. Since working with Mark Barr, he has reduced his purging episodes and is open to receiving treatment for his ED.  He currently doesn't have an PCP and he is looking for one.  He also is thinking about switching endocrinologist to Dr. Elvera Barr after he has  paid for his new pump with Dr. Sharl Barr Goals for RD meetings: to fix all this.  He realizes it will take time to heal and restore weight.  He would like to know how much he needs to eat to provide fuel for his exercise  Weight history:  Highest weight: 200-205 lb when playing football  Lowest weight: 145 lb  Most consistent weight: 160-165    What would you like to weigh:160 How has weight changed in the past year: gained muscle mass when he started weight training, then dropped 10 pounds in 3 months.  His lowest was 158.  His endocrinologist stated something to the effect that Mark Barr's weight of 175 (put on a lot of muscle mass from weight training) was too much for his frame and his BMI was too high.  Mark Barr then dropped 10 pound and Dr. Sharl Barr was pleased.  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 Caryl Barr.  No medical problems were identified at that visit, but lab data was evaulated.  Mark Barr had electrolytes tested in May and Dr. Caryl Barr found those normal and did not see a need for further data.  He presents today with dry, cracked lips which could be indicative of malnutrition/dehydration Changes in hair, skin,  nails since ED started: NA Sometimes has ulcer as result of purging and episodes of fatigue as a result of negative energy balance Chewing/swallowing difficulties: NA Relux or heartburn: acid reflux with heavy meal.  Not currently on medication.  nothing OTC Trouble with teeth: not due to ED, but does grind teeth.   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, but he's not sure   Dietary assessment  A typical day consists of 3 meals and 2-3 snacks  Safe foods include: peanut butter and jelly; most fruits; eggs; jello;  popsicles; protein bars; bagel Avoided foods include:fast food; creamy, heavy foods; pizza was a challenge, but he's doing better  24 hour recall:   B: bagel with cream cheese S: cheese and ritz crackers L: ham and cheese sandwich, apple,grapes, goldfish S: almonds and pepsi (went low) D: roast with carrots  S: fudgesicle  Beverages: water, diet soda, and pepsi ride bike  Dinner time is the hardest because it's a bigger meal and he might not have exercised "enough".  He has less control over dinner.  He is more anxious when his wife cooks.    Compensatory behaviors           Restricting (calories, fat, carbs)  SIV: 0-2 time this week.  Used to purge BID, purging varies each week  Diet pills: no  Laxatives: no  Diuretics: no  Alcohol or drugs: none  Exercise (what type):  Run, bike, lift weights  6-7 days.  3 days run and lift (1.5 hr) 4 days rides for 3 hr and might lift another day;  Also  walks dog most days.  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  As evidenced by dietary recall.  Intervention:  Reminded Mark Barr of intuitive eating: Encouraged patient to reject traditional diet mentality of "good" vs "bad" foods.  There are no good and bad foods, but rather food is fuel that we needs for our bodies.  When we don't get enough fuel, our bodies suffer the metabolic consequences.  Encouraged patient to eat whatever foods will satisfy them, regardless of their nutritional value.  We will discuss nutritional values of foods at a subsequent appointment.  Encouraged patient to honor their body's internal hunger and fullness cues.  Throughout the day, check in mentally and rate hunger.  Try not to eat when ravenous, but instead when slightly hungry.   Then choose food(s) that will be satisfying regardless of nutritional content.  Sit down to enjoy those foods.  Minimize distractions: turn off tv, put away books, work, Programmer, applications.  Make the meal last at least 20 minutes in order to give time to experience and register satiety.  Stop eating when full regardless of how much food is left on the plate.  Get more if still hungry.  The key is to honor fullness so throughout the meal, rate fullness factor and stop when comfortably full, but not stuffed.  Reminded patient that they can have any food they want, whenever they want, and however much they want.  Eventually the novelty will wear out and each food will be equal in terms of its emotional appeal.  This will be a learning process and some days more  food will be eaten, some days less.  The key is to honor hunger and fullness without any feelings of guilt.  Pay attention to what the internal cues are, rather than any external factors.  Try walking after dinner to ease anxiety.  Try yoga for stress relief.  Add 50 calories to each meal/snack- try 15 almonds instead of 10; eat 8 crackers instead of 6; etc  Monitoring and Evaluation: Patient will follow up in 2 weeks.

## 2013-02-11 ENCOUNTER — Ambulatory Visit (INDEPENDENT_AMBULATORY_CARE_PROVIDER_SITE_OTHER): Payer: 59 | Admitting: Internal Medicine

## 2013-02-11 ENCOUNTER — Encounter: Payer: Self-pay | Admitting: Internal Medicine

## 2013-02-11 VITALS — BP 118/68 | HR 69 | Temp 98.2°F | Resp 10 | Ht 70.0 in | Wt 170.8 lb

## 2013-02-11 DIAGNOSIS — E109 Type 1 diabetes mellitus without complications: Secondary | ICD-10-CM

## 2013-02-11 DIAGNOSIS — F502 Bulimia nervosa: Secondary | ICD-10-CM

## 2013-02-11 LAB — CBC
HCT: 44.2 % (ref 39.0–52.0)
Hemoglobin: 14.9 g/dL (ref 13.0–17.0)
MCHC: 33.8 g/dL (ref 30.0–36.0)
MCV: 92.2 fl (ref 78.0–100.0)
Platelets: 213 10*3/uL (ref 150.0–400.0)
RDW: 12.5 % (ref 11.5–14.6)

## 2013-02-11 LAB — COMPREHENSIVE METABOLIC PANEL
ALT: 16 U/L (ref 0–53)
Albumin: 4.4 g/dL (ref 3.5–5.2)
CO2: 31 mEq/L (ref 19–32)
Calcium: 9.4 mg/dL (ref 8.4–10.5)
Chloride: 104 mEq/L (ref 96–112)
GFR: 99.74 mL/min (ref >60.00)
Glucose, Bld: 177 mg/dL — ABNORMAL HIGH (ref 70–99)
Potassium: 4.3 mEq/L (ref 3.5–5.1)
Sodium: 139 mEq/L (ref 135–145)
Total Bilirubin: 1.2 mg/dL (ref 0.3–1.2)
Total Protein: 7.1 g/dL (ref 6.0–8.3)

## 2013-02-11 LAB — LIPASE: Lipase: 16 U/L (ref 11.0–59.0)

## 2013-02-11 LAB — LIPID PANEL
LDL Cholesterol: 99 mg/dL (ref 0–99)
Total CHOL/HDL Ratio: 3

## 2013-02-11 NOTE — Patient Instructions (Addendum)
Please return in 3 months. One thing that you can change to avoid insulin stacking is increasing the insulin-on-board time to 5 hours. Also, increasing the target CBG to 115 or 120 will help. Please stop at the lab. Please join MyChart, I will send you the results through there. Keep up the great work!     Basic Rules for Patients with Type I Diabetes Mellitus  1. The American Diabetes Association (ADA) recommended targets: - fasting sugar <130 - after meal sugar <180 - HbA1C <7%  2. Engage in ?150 min moderate exercise per week  3. Make sure you have ?8h of sleep every night as this helps both blood sugars and your weight.  4. Always keep a sugar log (not only record in your meter) and bring it to all appointments with [place].  5. If you are on a pump, know how to access the settings and to modify the parameters.  6.  Remember, you can always call the number on the back of the pump for emergencies related to the pump.  7. "15-15 rule" for hypoglycemia: if sugars are low, take 15 g of carbs** ("fast sugar" - e.g. 4 glucose tablets, 4 oz orange juice), wait 15 min, then check sugars again. If still <80, repeat. Continue  until your sugars >80, then eat a normal meal.   8. Teach family members and coworkers to inject glucagon. Have a glucagon set at home and one at work. They should call 911 after using the set.  9. If you are on a pump, set "insulin on board" time for 5 hours (if your sugars tend to be higher, can use 4 hours).   10. If you are on a pump, use the "dual wave bolus" setting for high fat foods (e.g. pizza). Start with a setting of 50%-50% (50% instant bolus and 50% prolonged bolus over 3h, for e.g.).    11. If you are on a pump, make sure the basal daily insulin dose is approximately equal (not larger) to the daily insulin you get from boluses, otherwise you are at risk for hypoglycemia.  12. Check sugar before driving. If <100, correct, and only start driving if sugars  rise ?409. Check sugar every hour when on a long drive.  13. Check sugar before exercising. If <100, correct, and only start exercising if sugars rise ?100. Check sugar every hour when on a long exercise routine and 1h after you finished exercising.   If >250, check urine for ketones. If you have moderate-large ketones in urine, do not start exercise. Hydrate yourself with clear liquids and correct the high sugar. Recheck sugars and ketones before attempting to exercise.  Be aware that you might need less insulin when exercising.  *intense, short, exercise bursts can increase your sugars, but  *less intense, longer (>1h), exercise routines can decrease your sugars.  If you are on a pump, you might need to decrease your basal rate by 10% or more (or even disconnect your pump) while you exercise to prevent low sugars. Do not disconnect your pump by more than 3 hours at a time! You also might need to decrease your insulin bolus for the meal prior to your exercise time by 20% or more.  14. Make sure you have a MedAlert bracelet or pendant mentioning "Type I Diabetes Mellitus". If you have a prior episode of severe hypoglycemia or hypoglycemia unawareness, it should also mention this.  15. Please do not walk barefoot. Inspect your feet for sores/cuts and  let us know if you have them.  16. Please call [facility] with any questions and concerns [phone]).  **E.g. of "fast carbs":   first choice (15 g):  1 tube glucose gel, GlucoPouch 15, 2 oz glucose liquid   second choice (15-16 g):  3 or 4 glucose tablets (best taken  with water), 15 Dextrose Bits chewable   third choice (15-20 g):   cup fruit juice,  cup regular soda, 1 cup skim milk,  1 cup sports drink   fourth choice (15-20 g):  1 small tube [place] gel (not frosting), 2 tbsp raisins, 1 tbsp table sugar,  candy, jelly beans, gum drops - check package for carb amount   (adapted from: Juluis Rainier. "Insulin therapy  and hypoglycemia" Endocrinol Metab Clin N Am 2012, 41: 57-87)

## 2013-02-11 NOTE — Progress Notes (Signed)
Patient ID: Mark Barr, male   DOB: 30-May-1985, 27 y.o.   MRN: [id number]  HPI: Mark Barr is a 27 y.o.-year-old male, referred by Mark Barr, for management of DM1, controlled, without complications. He was previously seen by Mark. Debara Barr, and previously by Mark. Adrian Barr, previously Mark Barr. No DKA or hypoglycemia admissions (once with T-slim functioning).  Patient has been diagnosed with diabetes in 12 (at 28 y/o); he started on an insulin pump in 2000. He used several pumps in the past >> likes the Medtronic [place], now on a 530 G loaner. He had the [facility] for last 4 years, loves it. 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   Last hemoglobin A1c was: 6.7% (10/2012), previously 6.4%.  Pt checks his sugars 5-6 x when DexCom (80-180 alarms) on, 10-12 x when off, a day and they are: - 4:30 am: 80-130 - exercises - could disconnect the pump or decrease the rate to 20-305 - b'fast 5:30 am - 8:30 am: 100-140 - snack 10:30: 100-150 - lunchtime 1 pm: varies more 100-220 - 4 pm: lower: 80-100 - before dinner 7:30-8 pm: 100-150 - bedtime 9-10 pm: 120-130  He can change the ICR based on Dexcom trend.   Has lows, but not frequently. Lowest sugar was 60s, maybe once a week, if delays a meal; he has hypoglycemia awareness at 7s. Highest sugar was 320, very rarely.  Pump settings: - Basal rates:  12 am: 0.475 3 am: 0.825 1 pm: 0.85 8 pm: 0.8 - Insulin to carb ratio: 1:10 - Insulin sensitivity factor: 1:40 - target CBG: 110 - Active insulin time: 4h - bolus wizard: On - Changes infusion site: q3 days  Pt's meals are: - Breakfast:  2 eggs + bagel + coffee - Lunch: sandwich, fruit (apple), veggies (carrots) and, goldfish - Dinner: chicken, rice, and veggies - Snacks: 2 snacks: almonds, fruit, or cheese crackers  He has a significant past medical history of bulimia, for which he is seeing Mark Barr with nutrition. We did not discuss much about this  today, but per her notes, he is not binging/purging as much, and not exercising as much as before. In the past, he was exercising on 0.5-3 hours daily 6-7 out of 7 days.He is eating 1400-1600 calories a day. His weight is stable at approximately 170 pounds, however per his report, his target is 160. He confirms today that he is doing better from the [facility] point of view, and has no hypoglycemia from this. 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  - no CKD per labs in June 2014 - but not available for review - last set of lipids: normal in June 2014 - last eye exam was in 03/2013. Had Lasik last year. No Mark.  - no numbness and tingling in his feet.  I reviewed his chart and he also has a history of Hashimoto thyroiditis - not on Levothyroxine as TFTs have been normal, GERD, anxiety and depression.  No FH of DM.  ROS: Constitutional: no weight gain/loss, no fatigue, no subjective hyperthermia/hypothermia Eyes: no blurry vision, no xerophthalmia ENT: no sore throat, no nodules palpated in throat, no dysphagia/odynophagia, no hoarseness Cardiovascular: no CP/SOB/palpitations/leg swelling Respiratory: no cough/SOB Gastrointestinal: no N/V/D/C Musculoskeletal: no muscle/joint aches Skin: no rashes Neurological: no tremors/numbness/tingling/dizziness Psychiatric: no depression/+ anxiety  Past Medical History  Diagnosis Date  . Diabetes mellitus without  complication   . Chicken pox   . Depression   . Eating disorder    Past Surgical History  Procedure Laterality Date  . Appendectomy     History   Social History  . Marital Status: Married    Spouse Name: N/A    Number of Children: 0   Occupational History  . 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 Smoking status: Never Smoker   . Smokeless tobacco: Not on file  . Alcohol Use: No  . Drug Use:  No  . Sexual Activity: Yes   Social History Narrative   Regular exercise: yes, biking, run, weight lifting   Caffeine use: daily   Current Outpatient Prescriptions on File Prior to Visit  Medication Sig Dispense Refill  . Insulin Human (INSULIN PUMP) 100 unit/ml SOLN Inject into the skin continuous. Novolog      . MELATONIN PO Take 1 tablet by mouth at bedtime as needed (for sleep).      . pantoprazole (PROTONIX) 40 MG tablet Take 1 tablet (40 mg total) by mouth daily.  30 tablet  6  . sertraline (ZOLOFT) 50 MG tablet Take 50 mg by mouth daily.       No current facility-administered medications on file prior to visit.   Allergies  Allergen Reactions  . Penicillins Rash   Family History  Problem Relation Age of Onset  . Thyroid disease Mother   . Hyperlipidemia Mother   . Alcohol abuse Maternal Grandmother   . Cancer Maternal Grandfather     colon and prostate   PE: BP 118/68  Pulse 69  Temp(Src) 98.2 F (36.8 C) (Oral)  Resp 10  Ht 5\' 10"  (1.778 m)  Wt 170 lb 12.8 oz (77.474 kg)  BMI 24.51 kg/m2  SpO2 98% Wt Readings from Last 3 Encounters:  02/11/13 170 lb 12.8 oz (77.474 kg)  01/07/13 171 lb (77.565 kg)  12/20/12 170 lb (77.111 kg)   Constitutional: normal weight, well-built, in NAD Eyes: PERRLA, EOMI, no exophthalmos ENT: moist mucous membranes, no thyromegaly, no cervical lymphadenopathy Cardiovascular: RRR, No MRG Respiratory: CTA B Gastrointestinal: abdomen soft, NT, ND, BS+ Musculoskeletal: no deformities, strength intact in all 4 Skin: moist, warm, no rashes Neurological: mild tremor with outstretched hands, DTR normal in all 4  ASSESSMENT: 1. DM1, controlled, without complications  2. Bulimia nervosa (BN) - Appears better controlled lately - Stable weight  PLAN:  1. Patient with long-standing, controlled DM1 - patient is doing a great job managing his diabetes, he is very knowledgeable about type 1 diabetes, and I do not see any major problems in  his insulin regimen. The only problem is that he might back his insulin, from overbolusing when he sees his sugars trending up on his CGM, however he can see the trend of his decreasing sugars, and drink some juice to avoid hypoglycemia. This happens maybe once a week. I advised him that he can increase his activities and time from 4 to 5 hours and he can also increase his target CBG from 110 to 115 or even 120 to reduce the risk of insulin stacking and subsequent hypoglycemia. However, I let him be the judge of this, especially since he is making changes to his settings at regular intervals if these become inadequate. - he is doing a great job taking his sugars very frequently and also wearing his Dexcom CGM 100% of the time. The  alarms on the CGM are adequate (80 and 180). - he has a glucagon kit at home, he would check if this is still good, and if not, he will let me know if he needs a prescription - given foot care handout and explained the principles  - given instructions for hypoglycemia management "15-15 rule"  - advice about exercise and driving with type 1 diabetes - he is up-to-date with yearly eye exams - next coming up in 03/2013.  2. BN - doing better - not binging as much and exercising a little less - we'll check several labs today - some are to investigate his bulimia, per request from nutrition: Orders Placed This Encounter  Procedures  . Urine Microalbumin w/creat. ratio  . Hemoglobin A1c  . Comp Met (CMET)  . Lipase  . Amylase  . TSH  . T4, free  . CBC  . T3, free  . Lipid Profile  - Return to clinic in 3 months with sugar log

## 2013-02-12 ENCOUNTER — Encounter: Payer: Self-pay | Admitting: Internal Medicine

## 2013-02-12 ENCOUNTER — Ambulatory Visit (INDEPENDENT_AMBULATORY_CARE_PROVIDER_SITE_OTHER): Payer: Self-pay | Admitting: Family Medicine

## 2013-02-12 ENCOUNTER — Ambulatory Visit: Payer: 59 | Admitting: Nutrition

## 2013-02-12 VITALS — BP 108/60 | Wt 168.0 lb

## 2013-02-12 DIAGNOSIS — E119 Type 2 diabetes mellitus without complications: Secondary | ICD-10-CM

## 2013-02-12 NOTE — Progress Notes (Signed)
Office Visit on 02/11/2013  Component Date Value Range Status  . Hemoglobin A1C 02/11/2013 6.9* 4.6 - 6.5 % Final   Glycemic Control Guidelines for People with Diabetes:Non Diabetic:  <6%Goal of Therapy: <7%Additional Action Suggested:  >8%   . Sodium 02/11/2013 139  135 - 145 mEq/L Final  . Potassium 02/11/2013 4.3  3.5 - 5.1 mEq/L Final  . Chloride 02/11/2013 104  96 - 112 mEq/L Final  . CO2 02/11/2013 31  19 - 32 mEq/L Final  . Glucose, Bld 02/11/2013 177* 70 - 99 mg/dL Final  . BUN 16/02/9603 10  6 - 23 mg/dL Final  . Creatinine, Ser 02/11/2013 1.0  0.4 - 1.5 mg/dL Final  . Total Bilirubin 02/11/2013 1.2  0.3 - 1.2 mg/dL Final  . Alkaline Phosphatase 02/11/2013 62  39 - 117 U/L Final  . AST 02/11/2013 22  0 - 37 U/L Final  . ALT 02/11/2013 16  0 - 53 U/L Final  . Total Protein 02/11/2013 7.1  6.0 - 8.3 g/dL Final  . Albumin 54/01/8118 4.4  3.5 - 5.2 g/dL Final  . Calcium 14/78/2956 9.4  8.4 - 10.5 mg/dL Final  . GFR 21/30/8657 99.74  >60.00 mL/min Final  . Lipase 02/11/2013 16.0  11.0 - 59.0 U/L Final  . Amylase 02/11/2013 77  27 - 131 U/L Final  . TSH 02/11/2013 2.35  0.35 - 5.50 uIU/mL Final  . Free T4 02/11/2013 0.79  0.60 - 1.60 ng/dL Final  . WBC 84/69/6295 5.9  4.5 - 10.5 K/uL Final  . RBC 02/11/2013 4.79  4.22 - 5.81 Mil/uL Final  . Platelets 02/11/2013 213.0  150.0 - 400.0 K/uL Final  . Hemoglobin 02/11/2013 14.9  13.0 - 17.0 g/dL Final  . HCT 28/41/3244 44.2  39.0 - 52.0 % Final  . MCV 02/11/2013 92.2  78.0 - 100.0 fl Final  . MCHC 02/11/2013 33.8  30.0 - 36.0 g/dL Final  . RDW 05/18/7251 12.5  11.5 - 14.6 % Final  . T3, Free 02/11/2013 2.7  2.3 - 4.2 pg/mL Final  . Cholesterol 02/11/2013 173  0 - 200 mg/dL Final   ATP III Classification       Desirable:  < 200 mg/dL               Borderline High:  200 - 239 mg/dL          High:  > = 664 mg/dL  . Triglycerides 02/11/2013 65.0  0.0 - 149.0 mg/dL Final   Normal:  <403 mg/dLBorderline High:  150 - 199 mg/dL  . HDL  02/11/2013 60.70  >39.00 mg/dL Final  . VLDL 47/42/5956 13.0  0.0 - 40.0 mg/dL Final  . LDL Cholesterol 02/11/2013 99  0 - 99 mg/dL Final  . Total CHOL/HDL Ratio 02/11/2013 3   Final                  Men          Women1/2 Average Risk     3.4          3.3Average Risk          5.0          4.42X Average Risk          9.6          7.13X Average Risk          15.0          11.0  Labs at goal. Will let pt know.  I d/w Denny Levy about pt >> he is having lows every day per his report to her.  I will download his CGM/pump at next visit. Will refer to Cristy Folks (DM education). She has worked with him in the past.

## 2013-02-13 NOTE — Progress Notes (Signed)
Patient presents today for 3 month diabetes follow-up as part of the employer-sponsored Link to Wellness program. Current diabetes regimen includes Novolog via pump. Most recent MD follow-up was yesterday with new endocrinologist, Dr. Elvera Lennox. No med changes or major health changes at this time. A1c at yesterday's appt was 6.9.   Diabetes Assessment: Diabetes: Year of diagnosis 3 yrs of age; checks feet daily; takes medications as prescribed; uses glucometer; does not take an aspirin a day; checks blood glucose 10-12 times per day (checking less while wearing CGM); hypoglycemia frequency infrequent; MD managing Diabetes Dr. Elvera Lennox, endo; Type of Diabetes: Type 1; Lowest CBG 60s; Highest CBG 315; A1c 6.9, 02/11/13 Other Diabetes History:  Current regimen includes novolog via pump. Patient continues using Medtronic pump with no issues. Thus far patient is pleased with this new pump. He also continues using Dexcom CGM. Glucose monitoring occurs 10-12 times daily when not using CGM and 4-6 times daily when wearing CGM. Patient did not bring meter in today but reports highest reading of >300 over previous month and lowest reading (other than due to pump error) of 60s. Patient is aware of how to appropriately correct hypoglycemia. Most readings, accoridng to patient report, are 140-150s. He will send CGM records to MD for review and adjustment of basal rates if needed. Patient is now seeing Dr. Elvera Lennox endocrinologist. Patient is due for yearly eye exam in November and will schedule an appt soon.   Lifestyle Factors: Exercise - no changes to exercise regimen, continues exercising ~5 days per week, biking and strength training.  Diet - No changes, continues to attempt to maintain a healthy diet and continues carb counting. Patient is being seen at [facility] for eating disorder. This is not something pt has been open about in our visits as it is a very sensitive subject. Notes are available in Epic and patient is being  followed closely by a dietician.  Assessment: Pt remains under great control but has experienced an A1c increase from 6.0 to 6.9. He was very unhappy with this, but I have encouraged him that he is still under good control. Patient continues to manage his DM with tight control, maintain excellent exercise routine and attempts to maintain a healthy diet, and is now undergoing treatment for an eating disorder.  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 He does admit this is very stressful since he is also working full time as an Char[employer] fundraiser, but he is managing. Will follow-up in 3 months.

## 2013-02-16 ENCOUNTER — Encounter: Payer: Self-pay | Admitting: Family Medicine

## 2013-02-22 ENCOUNTER — Ambulatory Visit: Payer: 59 | Admitting: *Deleted

## 2013-03-01 ENCOUNTER — Encounter: Payer: 59 | Attending: Internal Medicine | Admitting: *Deleted

## 2013-03-01 VITALS — Ht 69.0 in | Wt 172.0 lb

## 2013-03-01 DIAGNOSIS — Z9641 Presence of insulin pump (external) (internal): Secondary | ICD-10-CM | POA: Insufficient documentation

## 2013-03-01 DIAGNOSIS — Z713 Dietary counseling and surveillance: Secondary | ICD-10-CM | POA: Insufficient documentation

## 2013-03-01 DIAGNOSIS — F502 Bulimia nervosa: Secondary | ICD-10-CM

## 2013-03-01 DIAGNOSIS — E109 Type 1 diabetes mellitus without complications: Secondary | ICD-10-CM | POA: Insufficient documentation

## 2013-03-01 LAB — MICROALBUMIN / CREATININE URINE RATIO
Microalb Creat Ratio: 0.3 mg/g (ref 0.0–30.0)
Microalb, Ur: 0.2 mg/dL (ref 0.0–1.9)

## 2013-03-01 NOTE — Progress Notes (Signed)
Patient was seen on 03/01/13 for follow up nutrition counseling pertaining to disordered eating  1990 Mark Barr was diagnosed with Type 1 diabetes.  Currently on Animus pump.  Most recent HbA1c is 6.9%.  Diabetes is well-controlled, although current A1c is higher than previous of 6.7%.    Primary care MD: Denton Ar at [facility] Endocrinologist:  Dr. Elvera Lennox Therapist: Noni Saupe  Any other medical team members: unknown psychiatrist   Assessment  His weight continues to be stable ~170lb  School all day Wednesdays.  Works 3-4 12 hour shifts and is very stressed.  He hasn't been able to exercise quite as much as we could like, He purged 2 times last week.  His wife is acting as a barrier between him and purging.  He is eating 1400-1600 calories/day.  His goal is ultimately 2500-2500, depending on exercise level.  His weight continues to be stable around 170.  He is ok with Lowella Bandy acting as a barrier right now, but he predicts that he will get annoyed with her after a few weeks so we might need to find an alternative barrier in the near future.    He is very stressed right now with school and work.  His most recent HbA1c was 6.9%, which is high for him.  His CGM reveals good control and we aren't sure why his A1c is so high.  I will review the data with Jenita Seashore, RD, CDE and/or Dr. Elvera Lennox.  I miself have done extensive research on the role of ED on glycemic control.  ED-DMT1 is a very real issue and disordered eating does affect blood glucose control.  I told Robel that most likely his glucose is high because of the mental and physical stress he's under right now.  He states he will adjust his insulin, not his food which is good news.  He hasn't been to see Noni Saupe recently but he will make an appointment ASAP  Eating history:  Length of time: disordered eating began in college.  Drystan went to [employer] for football.  His eating disorder began in college Previous treatments: Saw  therapist regularly in college and saw RD around the same time.  These were positive experiences.  The RD initiated food challenges and intuitive eating.  [name] likes to eat more on a schedule, regardless of his hunger/satiety cues.  There has been about a 2 year gap since [place] received any type of treatment for his ED and lately things in his life have spiraled out of control. Since working with Noni Saupe, he has reduced his purging episodes and is open to receiving treatment for his ED.  He currently doesn't have an PCP and he is looking for one.  He also is thinking about switching endocrinologist to Dr. Elvera Lennox after he has paid for his new pump with Dr. Sharl Ma Goals for RD meetings: to fix all this.  He realizes it will take time to heal and restore weight.  He would like to know how much he needs to eat to provide fuel for his exercise  Weight history:  Highest weight: 200-205 lb when playing football  Lowest weight: 145 lb  Most consistent weight: 160-165    What would you like to weigh:160 How has weight changed in the past year: gained muscle mass when he started weight training, then dropped 10 pounds in 3 months.  His lowest was 158.  His endocrinologist stated something to the effect that Mark Barr's weight of 175 (put on a lot of  muscle mass from weight training) was too much for his frame and his BMI was too high.  Mark Barr then dropped 10 pound and Dr. Sharl Ma was pleased.  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 Caryl Never.  No medical problems were identified at that visit, but lab data was evaulated.  [name] had electrolytes tested in May and Dr. Caryl Never found those normal and did not see a need for further data.  He presents today with dry, cracked lips which could be indicative of  malnutrition/dehydration Changes in hair, skin, nails since ED started: NA Sometimes has ulcer as result of purging and episodes of fatigue as a result of negative energy balance Chewing/swallowing difficulties: NA Relux or heartburn: acid reflux with heavy meal.  Not currently on medication.  nothing OTC Trouble with teeth: not due to ED, but does grind teeth.   Constipation, diarrhea:  none  Mental health diagnosis: [name] thinks Dr. Evlyn Kanner diagnosed him with BN, but he's not sure   Dietary assessment  A typical day consists of 3 meals and 2-3 snacks  Safe foods include: peanut butter and jelly; most fruits; eggs; jello; popsicles; protein bars; bagel Avoided foods include:fast food; creamy, heavy foods; pizza was a challenge, but he's doing better  24 hour recall:   B: bagel with cream cheese S: cheese and ritz crackers L: ham and cheese sandwich, apple,grapes, goldfish S: almonds and pepsi (went low) D: roast with carrots  S: fudgesicle  Beverages: water, diet soda, and pepsi ride bike  Dinner time is the hardest because it's a bigger meal and he might not have exercised "enough".  He has less control over dinner.  He is more anxious when his wife cooks.  Dinner with his parents is also tough and it often triggers him to binge.    Compensatory behaviors           Restricting (calories, fat, carbs)  SIV: 23 time this week.  Used to purge BID, purging varies each week  Diet pills: no  Laxatives: no  Diuretics: no  Alcohol or drugs: none  Exercise (what type):  Run, bike, lift weights  4 days.  3 days run and lift (1.5 hr) 4 days rides for 3 hr and might lift another day;  Also  walks dog most days.  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  As evidenced by dietary recall.  Intervention:  Arles agreed to leave his parents' house shortly after dinner to reduce his anxiety.  He does not want me talking with his mom about his ED because she worries.  He will lean on his wife for support after dinner for these situations.  He agreed to post picture of his kids form camp in his bathroom to act as a barrier to purging.  I have encouraged him to stay calorie-consistent, even on the days he doesn't exercise.  We had a discussed about me not notifying him of his A1c.  I wanted to tell him in person, and he got quite upset when  I wouldn't tell him.  He almost cancelled appointments with me.  Lowella Bandy convinced him to come back.  I asked him if he wanted to space out our appointments, but he said 2 weeks was good for him  Monitoring and Evaluation: Patient will follow up in 2 weeks.

## 2013-03-11 ENCOUNTER — Ambulatory Visit: Payer: 59 | Admitting: *Deleted

## 2013-03-21 ENCOUNTER — Other Ambulatory Visit: Payer: Self-pay

## 2013-04-04 ENCOUNTER — Encounter: Payer: Self-pay | Admitting: Internal Medicine

## 2013-05-13 ENCOUNTER — Encounter: Payer: Self-pay | Admitting: Internal Medicine

## 2013-05-13 ENCOUNTER — Ambulatory Visit (INDEPENDENT_AMBULATORY_CARE_PROVIDER_SITE_OTHER): Payer: 59 | Admitting: Internal Medicine

## 2013-05-13 VITALS — BP 118/60 | HR 65 | Temp 98.4°F | Ht 69.0 in | Wt 172.5 lb

## 2013-05-13 DIAGNOSIS — E109 Type 1 diabetes mellitus without complications: Secondary | ICD-10-CM

## 2013-05-13 LAB — HEMOGLOBIN A1C: Hgb A1c MFr Bld: 6.6 % — ABNORMAL HIGH (ref 4.6–6.5)

## 2013-05-13 NOTE — Patient Instructions (Signed)
Please stop at the lab. Continue current pump settings. Please return in 3 months.

## 2013-05-13 NOTE — Progress Notes (Signed)
Patient ID: Mark Barr, male   DOB: June 21, 1985, 27 y.o.   MRN: [id number]  HPI: Mark Barr is a 27 y.o.-year-old male, returning for f/u for DM1, dx 41 (at 27 y/o), controlled, w/o complications, on insulin pump since 2000. Last visit 3 mo ago.  Pt is now on Medtronic Revel 530 G loaner. He had the [facility] for last 4 years, loves it.   Last hemoglobin A1c was higher than previous:  Lab Results  Component Value Date   HGBA1C 6.9* 02/11/2013  Prev. 6.7% (10/2012), previously 6.4%.  Pt checks his sugars 5-6 x when DexCom (80-180 alarms) on, 10-12 x when off, a day and they are (per download of his CGM - 25-75 quartiles): - 4:30 am: 80-130 >> 68-217 (104-188) - exercises  - b'fast 5:30 am - 8:30 am: 100-140 >> 100-189 - snack 10:30: 100-150 >> 113-167 - lunchtime 1 pm: varies more 100-220 >> 93-139 - 4 pm: lower: 80-100 >> 103-126 - before dinner 7:30-8 pm: 100-150 >> 91-244 - bedtime 9-10 pm: 120-130 >> 147-167 He can change the ICR based on Dexcom trend.   Average sugars 145.  Has lows, but not frequently. Lowest sugar: 40s - seldom - usually after exercise, if delays a meal; he has hypoglycemia awareness at 70s. Highest sugar was HI x1 since last visit (pump site pb).  Pump settings: - Basal rates: - TDD from basal ~20 12 am: 0.475 >> 0.525 (changed it 3 days ago) 3 am: 0.825 8 am: 0.9 (changed after last visit) 1 pm: 0.85 8 pm: 0.8 - Insulin to carb ratio: 1:10 - TDD from boluses ~25  - Insulin sensitivity factor: 1:40 - target CBG: 110 - Active insulin time: 4h - bolus wizard: On - Changes infusion site: q3 days - CGM alarms: 80 and 180  He has a temporal basal rate starting 1h before exercising. 75% for 1h, then decrease to 50%. He also has a snack (carbs) before exercise.  Pt's meals are: - Breakfast:  2 eggs + bagel + coffee - Lunch: sandwich, fruit (apple), veggies (carrots) and, goldfish - Dinner: chicken, rice, and veggies - Snacks: 2 snacks:  almonds, fruit, or cheese crackers  He has a significant past medical history of bulimia, for which he is seeing Denny Levy with nutrition. We did not discuss much about this today.  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  - no CKD: Lab Results  Component Value Date   BUN 10 02/11/2013   CREATININE 1.0 02/11/2013   - last set of lipids:  Lab Results  Component Value Date   CHOL 173 02/11/2013   HDL 60.70 02/11/2013   LDLCALC 99 02/11/2013   TRIG 65.0 02/11/2013   CHOLHDL 3 02/11/2013   - last eye exam was in 03/2013. Had Lasik last year. No DR.  - no numbness and tingling in his feet.  He also has a history of Hashimoto thyroiditis - not on Levothyroxine as TFTs have been normal.  Lab Results  Component Value Date   TSH 2.35 02/11/2013   Also has GERD, anxiety and depression.  I reviewed pt's medications, allergies, PMH, social hx, family hx and no changes required, except as mentioned above.  ROS: Constitutional: no weight gain/loss, no fatigue, no subjective hyperthermia/hypothermia Eyes: no blurry vision, no xerophthalmia ENT: no sore throat, no nodules palpated in throat, no dysphagia/odynophagia, no hoarseness Cardiovascular: no CP/SOB/palpitations/leg swelling Respiratory: no  cough/SOB Gastrointestinal: no N/V/D/C Musculoskeletal: no muscle/joint aches Skin: no rashes Neurological: no tremors/numbness/tingling/dizziness  PE: BP 118/60  Pulse 65  Temp(Src) 98.4 F (36.9 C) (Oral)  Ht 5\' 9"  (1.753 m)  Wt 172 lb 8 oz (78.245 kg)  BMI 25.46 kg/m2  SpO2 97% Wt Readings from Last 3 Encounters:  05/13/13 172 lb 8 oz (78.245 kg)  03/01/13 172 lb (78.019 kg)  02/13/13 168 lb (76.204 kg)   Constitutional: normal weight, well-built, in NAD Eyes: PERRLA, EOMI, no exophthalmos ENT: moist mucous membranes, no thyromegaly, no cervical lymphadenopathy Cardiovascular: RRR, No MRG Respiratory: CTA  B Gastrointestinal: abdomen soft, NT, ND, BS+ Musculoskeletal: no deformities, strength intact in all 4 Skin: moist, warm, no rashes  ASSESSMENT: 1. DM1, controlled, without complications  2. Bulimia nervosa (BN) - Appears better controlled lately - Stable weight  PLAN:  1. Patient with long-standing, controlled DM1 - patient is doing a great job managing his diabetes, he is very knowledgeable about type 1 diabetes, and I do not see any major problems in his insulin regimen. The only problem is that he might back his insulin, from overbolusing when he sees his sugars trending up on his CGM. He developed a good system of preventing lows after exercise, decreasing the basal rate 1h before starting to exercise to 75%, then decreasing to 50% when starts exercising. - he is doing a great job taking his sugars very frequently and also wearing his Dexcom CGM 100% of the time. The alarms on the CGM are adequate (80 and 180). - No reason to change his pump settings at this visit - check HbA1c today - he has a glucagon kit at home - he is up-to-date with yearly eye exams - last 03/2013. - Return to clinic in 3 months  Office Visit on 05/13/2013  Component Date Value Range Status  . Hemoglobin A1C 05/13/2013 6.6* 4.6 - 6.5 % Final   Glycemic Control Guidelines for People with Diabetes:Non Diabetic:  <6%Goal of Therapy: <7%Additional Action Suggested:  >8%    Excellent!

## 2013-06-25 ENCOUNTER — Ambulatory Visit (INDEPENDENT_AMBULATORY_CARE_PROVIDER_SITE_OTHER): Payer: Self-pay | Admitting: Family Medicine

## 2013-06-25 VITALS — BP 98/60 | Wt 169.0 lb

## 2013-06-25 DIAGNOSIS — E119 Type 2 diabetes mellitus without complications: Secondary | ICD-10-CM

## 2013-06-25 NOTE — Progress Notes (Signed)
Patient presents today for 3 month diabetes follow-up as part of the employer-sponsored Link to Wellness program. Current diabetes regimen includes Novolog via pump. ASA, ACEi, and statin is not indicated at this time. Most recent MD follow-up was Dec 2014. Patient has a pending appt for end of april (every 4 months). No med changes or major health changes at this time.   Diabetes Assessment: Type of Diabetes: Type 1;Sees Diabetes provider 3 times per year; MD managing Diabetes Dr. Wyonia HoughGerghe, endo; checks blood glucose more than 4 times a day 10-12 times per day. Checking less while wearing CGM.; A1c 6.6 (prev 6.9) Other Diabetes History: Current regimen includes novolog via pump. Patient continues using Medtronic pump. He also continues using Dexcom CGM. Glucose monitoring occurs 10-12 times daily when not using CGM and 4-6 times daily when wearing CGM. Patient did not bring meter in today but reports readings of 140-150s. He has noticed more frequent hyperglycemia that he anticipates is stress-related due to school and hectic work schedule. No hypoglycemia, except that expected after exercise. Lowest reading of 45, patient is aware of the signs and symptoms and reports hypoglycemia is easily corrected. Patient is due for yearly eye exam and will schedule an appt soon. A1c 6.6 (prev 6.9) at most recent MD appt.   Lifestyle Factors: Exercise - Patient attempts to maintain a strong exercise regimen but has not been able to exercise as much recently due to lack of time. He attempts to exercise 5 days per week for 1 hour each time, but admits that some weeks he is unable to exercise at all. At this time he is working or going to school ~90 hours a week.  Diet - No changes, continues to attempt to maintain a healthy diet and continues carb counting. [facility]. This is not something pt has been open about in our visits as it is a very sensitive subject. Notes are available in  epic and patient is being followed closely by RD.   Assessment: Pt remains under great control and has experienced an A1c decrease from 6.9 to 6.6. Patient continues to manage his DM with tight control, maintaining exercise as able despite lack of time and attempts to maintain a healthy diet. 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 He does admit this is very stressful since he is also working full time but he is managing. Will follow-up in 3 months.  Plan: 1) Continue making healthy dietary choices 2) Continue exercising as often as able due to work/school schedule 3) Continue testing regularly 4) Follow-up in 3 months

## 2013-09-05 NOTE — Progress Notes (Signed)
Patient ID: Mark Barr, male   DOB: 05/24/1985, 27 y.o.   MRN: [id number] ATTENDING PHYSICIAN NOTE: I have reviewed the chart and agree with the plan as detailed above. Sara Neal MD Pager [phone]  

## 2013-09-09 NOTE — Progress Notes (Signed)
Patient ID: Mark Barr, male   DOB: 1986/02/21, 28 y.o.   MRN: [id number] ATTENDING PHYSICIAN NOTE: I have reviewed the chart and agree with the plan as detailed above. Denny LevySara Selestino Nila MD Pager [phone]

## 2013-09-16 ENCOUNTER — Ambulatory Visit (INDEPENDENT_AMBULATORY_CARE_PROVIDER_SITE_OTHER): Payer: 59 | Admitting: Internal Medicine

## 2013-09-16 ENCOUNTER — Encounter: Payer: Self-pay | Admitting: Internal Medicine

## 2013-09-16 VITALS — BP 98/64 | HR 80 | Temp 98.3°F | Resp 12 | Wt 169.0 lb

## 2013-09-16 DIAGNOSIS — E109 Type 1 diabetes mellitus without complications: Secondary | ICD-10-CM

## 2013-09-16 LAB — HEMOGLOBIN A1C: HEMOGLOBIN A1C: 6.1 % (ref 4.6–6.5)

## 2013-09-16 NOTE — Progress Notes (Signed)
Patient ID: Mark Barr, male   DOB: 1985/10/03, 28 y.o.   MRN: [id number]  HPI: Mark Barr is a 28 y.o.-year-old male, returning for f/u for DM1, dx 18 (at 28 y/o), controlled, w/o complications, on insulin pump since 2000. Last visit 4 mo ago.  Pt is now on Medtronic Revel 530 G loaner. He had the [facility] for last 4 years, loves it.   He had a period of ~ 1 mo in 07/2013 >> sugars higher >> he increased the basal rates >> now adjusting them back down.   Last hemoglobin A1c:  Lab Results  Component Value Date   HGBA1C 6.6* 05/13/2013   HGBA1C 6.9* 02/11/2013  Prev. 6.7% (10/2012), previously 6.4%.  Pt checks his sugars 5-6 x when DexCom (80-180 alarms) on, 10-12 x when off, a day and they are (per download of his CGM - 25-75 quartiles): - 4:30 am: 80-130 >> 68-217 (104-188)  - exercises  - b'fast 5:30 am - 8:30 am: 100-140 >> 100-189 - snack 10:30: 100-150 >> 113-167 - lunchtime 1 pm: varies more 100-220 >> 93-139 - 4 pm: lower: 80-100 >> 103-126 - before dinner 7:30-8 pm: 100-150 >> 91-244 - bedtime 9-10 pm: 120-130 >> 147-167 He can change the ICR based on Dexcom trend.   Has lows. Lowest sugar: 50-60s; he has hypoglycemia awareness at 70s. Highest sugar was 360s since last visit  Pump settings: - Basal rates: - TDD from basal ~20 12 am: 0.475 >> 0.525 >> 0.500 3 am: 0.825 >> 0.875 8 am: 0.9  1:30 pm: 0.85 >> 0.925 8 pm: 0.8 >> 0.850 - Insulin to carb ratio: 1:10  - Insulin sensitivity factor: 1:40 >> 1:45 - target CBG: 100-115 - Active insulin time: 4h - bolus wizard: On - Changes infusion site: q3 days - CGM alarms: 80 and 180  He has a temporal basal rate starting 1h before exercising. 75% for 1h, then decrease to 50%. He also has a snack (carbs) before exercise.  Pt's meals are: - Breakfast:  2 eggs + bagel + coffee, sometimes cereals >> sugars spike after this - Lunch: sandwich, fruit (apple), veggies (carrots) and, goldfish - Dinner: chicken, rice,  and veggies - Snacks: 2 snacks: almonds, fruit, or cheese crackers  He has a significant past medical history of bulimia, for which he is seeing Denny Levy with nutrition.  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  - no CKD: Lab Results  Component Value Date   BUN 10 02/11/2013   CREATININE 1.0 02/11/2013   - last set of lipids:  Lab Results  Component Value Date   CHOL 173 02/11/2013   HDL 60.70 02/11/2013   LDLCALC 99 02/11/2013   TRIG 65.0 02/11/2013   CHOLHDL 3 02/11/2013   - last eye exam was in 03/2013. Had Lasik last year. No DR.  - no numbness and tingling in his feet.  He also has a history of Hashimoto thyroiditis - not on Levothyroxine as TFTs have been normal.  Lab Results  Component Value Date   TSH 2.35 02/11/2013   Also has GERD, anxiety and depression.  I reviewed pt's medications, allergies, PMH, social hx, family hx and no changes required, except as mentioned above. He stopped Zoloft and started Prozac.  ROS: Constitutional: no weight gain/loss, no fatigue, no subjective hyperthermia/hypothermia Eyes: no blurry vision, no xerophthalmia ENT: no sore throat, no nodules palpated in throat, no dysphagia/odynophagia, no hoarseness  Cardiovascular: no CP/SOB/palpitations/leg swelling Respiratory: no cough/SOB Gastrointestinal: no N/V/D/C Musculoskeletal: no muscle/joint aches Skin: no rashes Neurological: no tremors/numbness/tingling/dizziness  PE: BP 98/64  Pulse 80  Temp(Src) 98.3 F (36.8 C) (Oral)  Resp 12  Wt 169 lb (76.658 kg)  SpO2 98% Wt Readings from Last 3 Encounters:  09/16/13 169 lb (76.658 kg)  06/25/13 169 lb (76.658 kg)  05/13/13 172 lb 8 oz (78.245 kg)   Constitutional: normal weight, well-built, in NAD Eyes: PERRLA, EOMI, no exophthalmos ENT: moist mucous membranes, no thyromegaly, no cervical lymphadenopathy Cardiovascular: RRR, No MRG Respiratory: CTA B Gastrointestinal: abdomen soft, NT, ND,  BS+ Musculoskeletal: no deformities, strength intact in all 4 Skin: moist, warm, no rashes  ASSESSMENT: 1. DM1, controlled, without complications - he is decreasing the basal rate 1h before starting to exercise to 75%, then decreasing to 50% when starts exercising. - he is doing a great job taking his sugars very frequently and also wearing his Dexcom CGM 100% of the time. The alarms on the CGM are adequate (80 and 180).  2. Bulimia nervosa (BN) - Appears better controlled  - Stable weight  PLAN:  1. Patient with long-standing, controlled DM1 - patient is doing a great job managing his diabetes, he is very knowledgeable about type 1 diabetes. He has higher sugars after b'fast especially with cereals. Also, some of his basal rates (the am one esppecially) are too high >> advised him to decrease the am basal rate by ~10% since he did a basal rate validation this am: skipped b'fast and sugars trending down.  Patient Instructions  Please continue current insulin regimen, except for the following changes: - Basal rates:  12 am: 0.475 >> 0.525 >> 0.500 3 am: 0.825 >> 0.875 8 am: 0.9 >> 0.825 1:30 pm: 0.85 >> 0.925 8 pm: 0.8 >> 0.850 - Insulin to carb ratio: 1:10  - Insulin sensitivity factor: 1:40 >> 1:45, except for 5-10 am: 1:40 (with cereals) - target CBG: 100-115 - Active insulin time: 4h Please come back for a follow-up appointment in 3 months. Please stop at the lab. - check HbA1c today - he has a glucagon kit at home - he is up-to-date with yearly eye exams - last 03/2013. - Return to clinic in 3 months  Office Visit on 09/16/2013  Component Date Value Ref Range Status  . Hemoglobin A1C 09/16/2013 6.1  4.6 - 6.5 % Final   Glycemic Control Guidelines for People with Diabetes:Non Diabetic:  <6%Goal of Therapy: <7%Additional Action Suggested:  >8%    Great HbA1c!

## 2013-09-16 NOTE — Patient Instructions (Addendum)
Please continue current insulin regimen, except for the following changes: - Basal rates:  12 am: 0.475 >> 0.525 >> 0.500 3 am: 0.825 >> 0.875 8 am: 0.9 >> 0.825 1:30 pm: 0.85 >> 0.925 8 pm: 0.8 >> 0.850 - Insulin to carb ratio: 1:10  - Insulin sensitivity factor: 1:40 >> 1:45, except for 5-10 am: 1:40 - target CBG: 100-115 - Active insulin time: 4h  Please come back for a follow-up appointment in 3 months.  Please stop at the lab.

## 2013-10-21 ENCOUNTER — Other Ambulatory Visit: Payer: Self-pay | Admitting: Family Medicine

## 2013-10-22 ENCOUNTER — Other Ambulatory Visit: Payer: Self-pay | Admitting: *Deleted

## 2013-10-22 MED ORDER — GLUCOSE BLOOD VI STRP: ORAL_STRIP | Status: DC

## 2013-10-22 MED ORDER — INSULIN ASPART 100 UNIT/ML ~~LOC~~ SOLN: 45.0000 [IU] | Freq: Every day | SUBCUTANEOUS | Status: DC

## 2013-11-06 ENCOUNTER — Ambulatory Visit (INDEPENDENT_AMBULATORY_CARE_PROVIDER_SITE_OTHER): Payer: Self-pay | Admitting: Family Medicine

## 2013-11-06 VITALS — BP 96/64 | Wt 169.0 lb

## 2013-11-06 DIAGNOSIS — E109 Type 1 diabetes mellitus without complications: Secondary | ICD-10-CM

## 2013-11-08 NOTE — Progress Notes (Signed)
Patient presents today for 3 month diabetes follow-up as part of the employer-sponsored Link to Wellness program. Current diabetes regimen includes Novolog via pump. ASA, ACEi, and statin is not indicated at this time. Most recent MD follow-up was May 2015. Patient has a pending appt for August (every 3 months). Patient d/c sertraline and started fluoxetine, and is tolerating this well. No major health changes at this time.  Diabetes Assessment: Diabetes: Type 1; Year of diagnosis 3 yrs of age; checks feet daily; takes medications as prescribed; uses glucometer; does not take an aspirin a day; hypoglycemia frequency infrequent; Type of Diabetes: Type 1; Lowest CBG 60s; Sees Diabetes provider 3 times per year This varies, normally sees MD q4 mo, but sometimes more often, sometimes less often.; Highest CBG 315; MD managing Diabetes Dr. Wyonia HoughGerghe, endo; checks blood glucose more than 4 times a day 10-12 times per day. Checking less while wearing CGM Other Diabetes History: Current regimen includes novolog via pump. Patient continues using Medtronic pump. He also continues using Dexcom CGM. Glucose monitoring occurs 10-12 times daily when not using CGM and 4-6 times daily when wearing CGM. Patient did not bring meter in today but reports readings of 140-150s. MD made recent basal rate change suggestions and patient is managing these on his own with her recommendations. No hypoglycemia, except that expected after exercise. Lowest reading of 45, patient is aware of the signs and symptoms and reports hypoglycemia is easily corrected. Patient is due for yearly eye exam and will schedule an appt. A1c 6.6 (prev 6.9) at most recent MD appt.   Lifestyle Factors: Exercise - continues exercising 5-6 days per week, but for shorter time intervals due to busy schedule. Exercises include mtn biking and weight training.  Diet - No changes, continues to attempt to maintain a healthy diet and continues carb counting. [facility]  [facility]. This is not something pt has been open about in our visits as it is a very sensitive subject. Notes are available in epic and patient is being followed closely by RD.   Assessment: Pt remains under great control with A1c remaining at 6.6. Patient continues to manage his DM with tight control, maintaining exercise as able despite lack of time and attempts to maintain a healthy diet. 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 He does admit this is very stressful since he is also working full time but he is managing. Will follow-up in 3 months.  Plan: 1) Continue making healthy dietary choices 2) Continue exercising regularly 3) Follow-up in 3 months

## 2013-12-20 ENCOUNTER — Ambulatory Visit: Payer: 59 | Admitting: Internal Medicine

## 2013-12-24 ENCOUNTER — Encounter: Payer: Self-pay | Admitting: Internal Medicine

## 2013-12-24 ENCOUNTER — Ambulatory Visit (INDEPENDENT_AMBULATORY_CARE_PROVIDER_SITE_OTHER): Payer: 59 | Admitting: Internal Medicine

## 2013-12-24 VITALS — BP 104/68 | HR 72 | Temp 98.1°F | Resp 12 | Wt 167.8 lb

## 2013-12-24 DIAGNOSIS — E109 Type 1 diabetes mellitus without complications: Secondary | ICD-10-CM

## 2013-12-24 LAB — COMPREHENSIVE METABOLIC PANEL
ALT: 15 U/L (ref 0–53)
AST: 24 U/L (ref 0–37)
Albumin: 4.5 g/dL (ref 3.5–5.2)
Alkaline Phosphatase: 62 U/L (ref 39–117)
BILIRUBIN TOTAL: 1.2 mg/dL (ref 0.2–1.2)
BUN: 14 mg/dL (ref 6–23)
CO2: 25 meq/L (ref 19–32)
Calcium: 9.3 mg/dL (ref 8.4–10.5)
Chloride: 99 mEq/L (ref 96–112)
Creatinine, Ser: 1.1 mg/dL (ref 0.4–1.5)
GFR: 84.7 mL/min (ref >60.00)
Glucose, Bld: 137 mg/dL — ABNORMAL HIGH (ref 70–99)
Potassium: 3.7 mEq/L (ref 3.5–5.1)
SODIUM: 137 meq/L (ref 135–145)
TOTAL PROTEIN: 7.4 g/dL (ref 6.0–8.3)

## 2013-12-24 LAB — HEMOGLOBIN A1C: Hgb A1c MFr Bld: 6.9 % — ABNORMAL HIGH (ref 4.6–6.5)

## 2013-12-24 LAB — LIPID PANEL
CHOL/HDL RATIO: 3
Cholesterol: 171 mg/dL (ref 0–200)
HDL: 63.2 mg/dL (ref >39.00)
LDL Cholesterol: 96 mg/dL (ref 0–99)
NonHDL: 107.8
TRIGLYCERIDES: 60 mg/dL (ref 0.0–149.0)
VLDL: 12 mg/dL (ref 0.0–40.0)

## 2013-12-24 LAB — MICROALBUMIN / CREATININE URINE RATIO
CREATININE, U: 233.1 mg/dL
MICROALB/CREAT RATIO: 0.5 mg/g (ref 0.0–30.0)
Microalb, Ur: 1.2 mg/dL (ref 0.0–1.9)

## 2013-12-24 LAB — TSH: TSH: 1.34 u[IU]/mL (ref 0.35–4.50)

## 2013-12-24 NOTE — Progress Notes (Signed)
Patient ID: Mark Barr, male   DOB: 1985-09-02, 28 y.o.   MRN: [id number]  HPI: Mark Barr is a 28 y.o.-year-old male, returning for f/u for DM1, dx 89 (at 28 y/o), controlled, w/o complications, on insulin pump since 2000. Last visit 3 mo ago.  Pt is now on [place]. He had the [facility] for last ~4 years, loves it.   Last hemoglobin A1c:  Lab Results  Component Value Date   HGBA1C 6.1 09/16/2013   HGBA1C 6.6* 05/13/2013   HGBA1C 6.9* 02/11/2013  Prev. 6.7% (10/2012), previously 6.4%.  Pt checks his sugars 5-6 x when DexCom (80-180 alarms) on, 10-12 x when off, a day and they are (per download of his CGM - 25-75 quartiles): - 4:30 am: 80-130 >> 68-217 (104-188) >> 61-187 - exercises  - b'fast 5:30 am: 60-345 - 8:30 am: 100-140 >> 100-189 >> 78-302 - snack 10:30: 100-150 >> 113-167 >> 93-248 - lunchtime 1 pm: varies more 100-220 >> 93-139 >> 67-231 - 4 pm: lower: 80-100 >> 103-126 >> 69-203 - before dinner 7:30-8 pm: 100-150 >> 91-244 >> 44-261 - bedtime 9-10 pm: 120-130 >> 147-167 >> 94-280 Has lows. Lowest sugar: 44; he has hypoglycemia awareness at 70s. Highest sugar was 350 since last visit  Pump settings:  Basal rates: TDD 43% 12 am: 0.475 >> 0.525 >> 0.500 >> 0.550 3 am: 0.825 >> 0.87 >> 0.925 8 am: 0.9 >> 0.825 >> 0.850 1:30 pm: 0.85 >> 0.925 >> 1pm: 0.950 8 pm: 0.8 >> 0.850 - Insulin to carb ratio: 1:10  - Insulin sensitivity factor: 1:40 >> 1:45, except for 5-10 am: 1:40 (with cereals) >> all 40 - target CBG: 100-115 TDD bolus 57% - Active insulin time: 4h - bolus wizard: On - Changes infusion site: q3 days - CGM alarms: 80 and 180  He has a temporal basal rate starting 1h before exercising. 75% for 1h, then decrease to 50%. He also has a snack (carbs) before exercise.  Pt's meals are: - Breakfast:  2 eggs + bagel + coffee, sometimes cereals >> sugars spike after this - Lunch: sandwich, fruit (apple), veggies (carrots) and,  goldfish - Dinner: chicken, rice, and veggies - Snacks: 2 snacks: almonds, fruit, or cheese crackers  He has a significant past medical history of bulimia, for which he is seeing Ozzie Hoyle with nutrition.  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  - no CKD: Lab Results  Component Value Date   BUN 10 02/11/2013   CREATININE 1.0 02/11/2013   - last set of lipids:  Lab Results  Component Value Date   CHOL 173 02/11/2013   HDL 60.70 02/11/2013   LDLCALC 99 02/11/2013   TRIG 65.0 02/11/2013   CHOLHDL 3 02/11/2013   - last eye exam was in 03/2013. No DR.  - no numbness and tingling in his feet.  He also has a history of Hashimoto thyroiditis - not on Levothyroxine as TFTs have been normal.  Lab Results  Component Value Date   TSH 2.35 02/11/2013   Also has GERD, anxiety and depression.  I reviewed pt's medications, allergies, PMH, social hx, family hx and no changes required, except as mentioned above.  ROS: Constitutional: no weight gain/loss, no fatigue, no subjective hyperthermia/hypothermia Eyes: no blurry vision, no xerophthalmia ENT: no sore throat, no nodules palpated in throat, no dysphagia/odynophagia, no hoarseness Cardiovascular: no CP/SOB/palpitations/leg swelling Respiratory: no cough/SOB  Gastrointestinal: no N/V/D/C Musculoskeletal: no muscle/joint aches Skin: no rashes Neurological: no tremors/numbness/tingling/dizziness  PE: BP 104/68  Pulse 72  Temp(Src) 98.1 F (36.7 C) (Oral)  Resp 12  Wt 167 lb 12.8 oz (76.114 kg)  SpO2 98% Wt Readings from Last 3 Encounters:  12/24/13 167 lb 12.8 oz (76.114 kg)  11/08/13 169 lb (76.658 kg)  09/16/13 169 lb (76.658 kg)   Constitutional: normal weight, well-built, in NAD Eyes: PERRLA, EOMI, no exophthalmos ENT: moist mucous membranes, no thyromegaly, no cervical lymphadenopathy Cardiovascular: RRR, No MRG Respiratory: CTA B Gastrointestinal: abdomen soft, NT, ND,  BS+ Musculoskeletal: no deformities, strength intact in all 4 Skin: moist, warm, no rashes  ASSESSMENT: 1. DM1, controlled, without complications - he is decreasing the basal rate 1h before starting to exercise to 75%, then decreasing to 50% when starts exercising. - he is doing a great job taking his sugars very frequently and also wearing his Dexcom CGM 100% of the time. The alarms on the CGM are adequate (80 and 180).  2. Bulimia nervosa (BN) - Appears better controlled  - Stable weight - not addressed at this visit  PLAN:  1. Patient with long-standing, controlled DM1 - reviewed both pump download and CGM download (will scan) with the pt - patient is doing a great job managing his diabetes, he is very knowledgeable about type 1 diabetes. He has higher sugars after b'fast especially with cereals. We will decrease the ICR with that meal. Also, some of his basal rates (the am one esppecially) are too high (low sugars b/w 12-3 am >> advised him to decrease the am basal rate.  Patient Instructions  Please change the pump settings as follows: - Basal rates:  12 am: 0.550 >> temporary basal rate 0.500 if you exercise before dinner (try to enter as pattern A) 3 am: 0.925  8 am: 0.850  1pm: 0.950  8 pm: 0.850  - Insulin to carb ratio:  12 am: 1:10 6 am: 1:8 11:30 am: 1:10 5 pm: 1:8 Try to use the extended bolus: 70%-30% or 60%-40% and extend the infusion interval to 2-3h (try it first during the day) - Insulin sensitivity factor: 40  - target CBG: 100-115  - Active insulin time: 4h Please come back for a follow-up appointment in 3 months Please stop at the lab. - check HbA1c, CMP, ACR, TSH, Lipids today - he has a glucagon kit at home - he is up-to-date with yearly eye exams - last 03/2013. - Return to clinic in 3 months  Office Visit on 12/24/2013  Component Date Value Ref Range Status  . Sodium 12/24/2013 137  135 - 145 mEq/L Final  . Potassium 12/24/2013 3.7  3.5 - 5.1  mEq/L Final  . Chloride 12/24/2013 99  96 - 112 mEq/L Final  . CO2 12/24/2013 25  19 - 32 mEq/L Final  . Glucose, Bld 12/24/2013 137* 70 - 99 mg/dL Final  . BUN 12/24/2013 14  6 - 23 mg/dL Final  . Creatinine, Ser 12/24/2013 1.1  0.4 - 1.5 mg/dL Final  . Total Bilirubin 12/24/2013 1.2  0.2 - 1.2 mg/dL Final  . Alkaline Phosphatase 12/24/2013 62  39 - 117 U/L Final  . AST 12/24/2013 24  0 - 37 U/L Final  . ALT 12/24/2013 15  0 - 53 U/L Final  . Total Protein 12/24/2013 7.4  6.0 - 8.3 g/dL Final  . Albumin 12/24/2013 4.5  3.5 - 5.2 g/dL Final  . Calcium 12/24/2013 9.3  8.4 -  10.5 mg/dL Final  . GFR 12/24/2013 84.70  >60.00 mL/min Final  . Hemoglobin A1C 12/24/2013 6.9* 4.6 - 6.5 % Final   Glycemic Control Guidelines for People with Diabetes:Non Diabetic:  <6%Goal of Therapy: <7%Additional Action Suggested:  >8%   . TSH 12/24/2013 1.34  0.35 - 4.50 uIU/mL Final  . Microalb, Ur 12/24/2013 1.2  0.0 - 1.9 mg/dL Final  . Creatinine,U 12/24/2013 233.1   Final  . Microalb Creat Ratio 12/24/2013 0.5  0.0 - 30.0 mg/g Final  . Cholesterol 12/24/2013 171  0 - 200 mg/dL Final   ATP III Classification       Desirable:  < 200 mg/dL               Borderline High:  200 - 239 mg/dL          High:  > = 240 mg/dL  . Triglycerides 12/24/2013 60.0  0.0 - 149.0 mg/dL Final   Normal:  <150 mg/dLBorderline High:  150 - 199 mg/dL  . HDL 12/24/2013 63.20  >39.00 mg/dL Final  . VLDL 12/24/2013 12.0  0.0 - 40.0 mg/dL Final  . LDL Cholesterol 12/24/2013 96  0 - 99 mg/dL Final  . Total CHOL/HDL Ratio 12/24/2013 3   Final                  Men          Women1/2 Average Risk     3.4          3.3Average Risk          5.0          4.42X Average Risk          9.6          7.13X Average Risk          15.0          11.0                      . NonHDL 12/24/2013 107.80   Final   NOTE:  Non-HDL goal should be 30 mg/dL higher than patient's LDL goal (i.e. LDL goal of < 70 mg/dL, would have non-HDL goal of < 100 mg/dL)   Msg  sent: Dear Hedda Slade, HbA1c is higher as we suspected. The rest of the labs are great! Sincerely, Philemon Kingdom MD  - time spent with the patient: 40 min, of which >50% was spent in reviewing pump and CGM download and troubleshooting his CBG readings, adjusting his pump settings and developing a plan to avoid future highs and low CBGs. We also discussed about dietary changes.

## 2013-12-24 NOTE — Patient Instructions (Signed)
Please change the pump settings as follows: - Basal rates:  12 am: 0.550 >> temporary basal rate 0.500 if you exercise before dinner (try to enter as pattern A) 3 am: 0.925  8 am: 0.850  1pm: 0.950  8 pm: 0.850  - Insulin to carb ratio:  12 am: 1:10 6 am: 1:8 11:30 am: 1:10 5 pm: 1:8 Try to use the extended bolus: 70%-30% or 60%-40% and extend the infusion interval to 2-3h (try it first during the day) - Insulin sensitivity factor: 40  - target CBG: 100-115  - Active insulin time: 4h  Please come back for a follow-up appointment in 3 months  Please stop at the lab.

## 2013-12-31 ENCOUNTER — Encounter: Payer: Self-pay | Admitting: Family Medicine

## 2013-12-31 NOTE — Progress Notes (Signed)
Patient ID: Mark ChimesSpenser R Cullens, male   DOB: 06-04-1985, 28 y.o.   MRN: [id number] Reviewed: Agree with the documentation and management of our pharmacologist.

## 2014-01-10 ENCOUNTER — Telehealth: Payer: Self-pay | Admitting: Internal Medicine

## 2014-01-10 NOTE — Telephone Encounter (Signed)
Sonya asked if you received paper work for patient Glucose monitor.  Please advise Call sonya [phone]

## 2014-01-10 NOTE — Telephone Encounter (Signed)
Returned call to [place] at [facility]. She had not received the paper work for pt's glucose monitor. Refaxed to [phone].

## 2014-01-22 ENCOUNTER — Other Ambulatory Visit: Payer: Self-pay | Admitting: *Deleted

## 2014-01-22 MED ORDER — INSULIN ASPART 100 UNIT/ML ~~LOC~~ SOLN: 45.0000 [IU] | Freq: Every day | SUBCUTANEOUS | Status: DC

## 2014-02-05 ENCOUNTER — Ambulatory Visit (INDEPENDENT_AMBULATORY_CARE_PROVIDER_SITE_OTHER): Payer: Self-pay | Admitting: Family Medicine

## 2014-02-05 VITALS — BP 116/69 | Wt 166.0 lb

## 2014-02-05 DIAGNOSIS — E109 Type 1 diabetes mellitus without complications: Secondary | ICD-10-CM

## 2014-02-05 NOTE — Progress Notes (Signed)
Patient presents today for 3 month diabetes follow-up as part of the employer-sponsored Link to Wellness program. Current diabetes regimen includes Novolog via pump. ASA, ACEi, and statin is not indicated at this time. Most recent MD follow-up was Aug 2015. Patient has a pending appt for November (every 3 months). No major health changes at this time.  Diabetes Assessment: Diabetes: Year of diagnosis 3 yrs of age; checks feet daily; takes medications as prescribed; uses glucometer; does not take an aspirin a day; hypoglycemia frequency infrequent; Type of Diabetes: Type 1; MD managing Diabetes Dr. Wyonia Hough, endo; checks blood glucose more than 4 times a day 10-12 times per day. Checking less while wearing CGM.; Highest CBG 250s; Lowest CBG 60s; Sees Diabetes provider 4 or more times per year This varies, normally sees MD q4 mo, but sometimes more often, sometimes less often.; A1c 6.9 (12/24/13) Other Diabetes History: Current regimen includes novolog via pump. Patient continues using Medtronic pump. He also continues using Dexcom CGM. Recently upgraded to G-force Share and will next upgrade to G-5 CGM which communicates readings to cell phone or other device. Glucose monitoring occurs 10-12 times daily when not using CGM and 4-6 times daily when wearing CGM. Patient did not bring meter in today but reports readings of 140-150s. Upper range reaches mid 200s if spikes after a meal and lower range drops to 60s on occasion. MD made recent basal rate change suggestions and patient is managing these on his own with her recommendations. MD recommended adjusting breakfast carb ratio to 1:8 (vs 1:10), pt did so and experienced hypoglycemia and is now back to using initial carb ratio of 1:10. No hypoglycemia, except that expected after exercise. Patient is due for yearly eye exam and will schedule an appt. A1c 6.9 (prev 6.6) at most recent MD appt.  Lifestyle Factors: Exercise - continues exercising 5-6 days per week, but  for shorter time intervals due to busy schedule. Exercises include mtn biking and weight training.  Diet - No changes, continues to attempt to maintain a healthy diet and continues carb counting. Patient is being seen at [facility] for eating disorder. This is not something pt has been open about in our visits as it is a very sensitive subject. Notes are available in epic and patient is being followed closely by RD.   Assessment: Patient remains under great DM control with slightly increased A1c of 6.9 (prev 6.6).  He continues with an appropriate diet and exercise regimen.  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  Given level of knowledge and level of control we will pursue 6 month follow-up going forward.    Plan: 1) Continue making healthy dietary choices 2) Continue exercising regularly 3) Follow-up in 6 months

## 2014-02-11 ENCOUNTER — Encounter: Payer: Self-pay | Admitting: Internal Medicine

## 2014-02-13 ENCOUNTER — Other Ambulatory Visit: Payer: Self-pay | Admitting: Internal Medicine

## 2014-02-13 MED ORDER — INSULIN DETEMIR 100 UNIT/ML FLEXPEN: 20.0000 [IU] | PEN_INJECTOR | Freq: Every day | SUBCUTANEOUS | Status: DC

## 2014-02-13 MED ORDER — INSULIN PEN NEEDLE 32G X 4 MM MISC: Status: AC

## 2014-02-13 MED ORDER — INSULIN ASPART 100 UNIT/ML FLEXPEN: PEN_INJECTOR | SUBCUTANEOUS | Status: DC

## 2014-03-11 ENCOUNTER — Other Ambulatory Visit: Payer: Self-pay

## 2014-03-11 MED ORDER — PANTOPRAZOLE SODIUM 40 MG PO TBEC: DELAYED_RELEASE_TABLET | ORAL | Status: DC

## 2014-03-11 NOTE — Telephone Encounter (Signed)
Rx sent to pharmacy. Pt aware.  

## 2014-03-26 ENCOUNTER — Ambulatory Visit: Payer: 59 | Admitting: Internal Medicine

## 2014-04-02 NOTE — Progress Notes (Signed)
Patient ID: Mark Barr, male   DOB: 07-22-1985, 28 y.o.   MRN: [id number] Reviewed: Agree with the documentation and management of our [facility].

## 2014-04-28 ENCOUNTER — Encounter: Payer: Self-pay | Admitting: Internal Medicine

## 2014-04-28 ENCOUNTER — Ambulatory Visit (INDEPENDENT_AMBULATORY_CARE_PROVIDER_SITE_OTHER): Payer: 59 | Admitting: Internal Medicine

## 2014-04-28 VITALS — BP 108/68 | HR 69 | Temp 98.0°F | Resp 12 | Wt 171.0 lb

## 2014-04-28 DIAGNOSIS — E109 Type 1 diabetes mellitus without complications: Secondary | ICD-10-CM

## 2014-04-28 LAB — HEMOGLOBIN A1C: Hgb A1c MFr Bld: 7.2 % — ABNORMAL HIGH (ref 4.6–6.5)

## 2014-04-28 MED ORDER — GLUCOSE BLOOD VI STRP: ORAL_STRIP | Status: DC

## 2014-04-28 NOTE — Patient Instructions (Signed)
Please continue the current pump settings: - Basal rates:  12 am: 0.40 3 am: 0.85 8 am: 0.850  1pm: 0.90 8 pm: 0.850  - Insulin to carb ratio: 1:10 - Insulin sensitivity factor: 40  - target CBG: 105-115  - Active insulin time: 4h  Please stop at the lab.  Please come back for a follow-up appointment in 3 months

## 2014-04-28 NOTE — Progress Notes (Signed)
Patient ID: Mark Barr, male   DOB: 1985-11-30, 28 y.o.   MRN: [id number]  HPI: Mark Barr is a 28 y.o.-year-old male, returning for f/u for DM1, dx 70 (at 28 y/o), controlled, w/o complications, on insulin pump since 2000. Last visit 4 mo ago.  Pt was on Medtronic Revel 530 G loaner >> now on a Omnipod since 04/19/2014. He loves it. He had the [facility] for last ~4 years, loves it.   His sugars were a little higher in 02/2014 >> URI.  Last hemoglobin A1c:  Lab Results  Component Value Date   HGBA1C 6.9* 12/24/2013   HGBA1C 6.1 09/16/2013   HGBA1C 6.6* 05/13/2013  Prev. 6.7% (10/2012), previously 6.4%.  Pt checks his sugars 5-8 x when DexCom (80-180 alarms) on, 10-12 x when off, a day and they are better: - 4:30 am: 80-130 >> 68-217 (104-188) >> 61-187 >> 87-140 - exercises  - b'fast 5:30 am: 60-345 >> 110 - 8:30 am: 100-140 >> 100-189 >> 78-302 >> 110-115 - snack 10:30: 100-150 >> 113-167 >> 93-248 >> n/c - lunchtime 1 pm: varies more 100-220 >> 93-139 >> 67-231 >> 107-139, 208 - 4 pm: lower: 80-100 >> 103-126 >> 69-203 >> 107 - before dinner 7:30-8 pm: 100-150 >> 91-244 >> 44-261 >> 62-150, 250x1 - bedtime 9-10 pm: 120-130 >> 147-167 >> 94-280 >> 106-243, 275 Has lows. Lowest sugar: 31!!! At night, after a whole day of moving; he has hypoglycemia awareness at 70s. Highest sugar was 300s since last visit  Pump settings: He had to decrease the insulin as he had lows after changing to the Omnipod:  - Basal rates:  12 am: 0.550 >> 0.40 3 am: 0.925 >> 0.85 8 am: 0.850  1pm: 0.950 >> 0.90 8 pm: 0.850  - Insulin to carb ratio:  12 am: 1:10 6 am: 1:8 >> 1:10 11:30 am: 1:10 5 pm: 1:8 >> 1:10 - Insulin sensitivity factor: 40  - target CBG: 105-115  - Active insulin time: 4h - bolus wizard: On - Changes infusion site: q3 days - CGM alarms: 80 and 180  Pt's meals are: - Breakfast:  2 eggs + bagel + coffee, sometimes cereals >> sugars spike after this - Lunch:  sandwich, fruit (apple), veggies (carrots) and, goldfish - Dinner: chicken, rice, and veggies - Snacks: 2 snacks: almonds, fruit, or cheese crackers  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  - no CKD: Lab Results  Component Value Date   BUN 14 12/24/2013   CREATININE 1.1 12/24/2013   - last set of lipids:  Lab Results  Component Value Date   CHOL 171 12/24/2013   HDL 63.20 12/24/2013   LDLCALC 96 12/24/2013   TRIG 60.0 12/24/2013   CHOLHDL 3 12/24/2013   - last eye exam was in 03/2013. No DR.  - no numbness and tingling in his feet.  He also has a history of Hashimoto thyroiditis - not on Levothyroxine as TFTs have been normal.  Lab Results  Component Value Date   TSH 1.34 12/24/2013   Also has GERD, anxiety and depression.  I reviewed pt's medications, allergies, PMH, social hx, family hx and no changes required, except as mentioned above.  ROS: Constitutional: no weight gain/loss, no fatigue, no subjective hyperthermia/hypothermia Eyes: no blurry vision, no xerophthalmia ENT: no sore throat, no nodules palpated in throat, no dysphagia/odynophagia, no hoarseness Cardiovascular: no CP/SOB/palpitations/leg swelling Respiratory: + cough/no SOB Gastrointestinal:  no N/V/D/C Musculoskeletal: no muscle/joint aches Skin: no rashes Neurological: no tremors/numbness/tingling/dizziness  PE: BP 108/68 mmHg  Pulse 69  Temp(Src) 98 F (36.7 C) (Oral)  Resp 12  Wt 171 lb (77.565 kg)  SpO2 96% Wt Readings from Last 3 Encounters:  04/28/14 171 lb (77.565 kg)  02/05/14 166 lb (75.297 kg)  12/24/13 167 lb 12.8 oz (76.114 kg)   Constitutional: normal weight, well-built, in NAD Eyes: PERRLA, EOMI, no exophthalmos ENT: moist mucous membranes, no thyromegaly, no cervical lymphadenopathy Cardiovascular: RRR, No MRG Respiratory: CTA B Gastrointestinal: abdomen soft, NT, ND, BS+ Musculoskeletal: no deformities, strength intact in  all 4 Skin: moist, warm, no rashes  ASSESSMENT: 1. DM1, controlled, without complications - he is decreasing the basal rate 1h before starting to exercise to 75%, then decreasing to 50% when starts exercising. - he is doing a great job taking his sugars very frequently and also wearing his Dexcom CGM 100% of the time. The alarms on the CGM are adequate (80 and 180).  2. Bulimia nervosa (BN) - Appears better controlled  - Stable weight - not addressed at this visit  PLAN:  1. Patient with long-standing, controlled DM1 - reviewed both pump download with the pt - he only got the pump attached 1 week ago. - patient is doing a great job managing his diabetes, he is very knowledgeable about type 1 diabetes. He had a severe low (31) at night after a day of increased activity >> he needs to start temp basal rates after days like this. Overall, sugars better, though!  Patient Instructions  Please continue the current pump settings: - Basal rates:  12 am: 0.40 3 am: 0.85 8 am: 0.850  1pm: 0.90 8 pm: 0.850  - Insulin to carb ratio: 1:10 - Insulin sensitivity factor: 40  - target CBG: 105-115  - Active insulin time: 4h  Please stop at the lab.  Please come back for a follow-up appointment in 3 months  - check HbA1c today - he has a glucagon kit at home - he is up-to-date with yearly eye exams - last 03/2013. - Return to clinic in 3 months  - time spent with the patient: 25 min, of which >50% was spent in reviewing pump and download and troubleshooting his CBG readings, reviewing his pump settings and developing a plan to avoid future highs and low CBGs.  Office Visit on 04/28/2014  Component Date Value Ref Range Status  . Hgb A1c MFr Bld 04/28/2014 7.2* 4.6 - 6.5 % Final   Glycemic Control Guidelines for People with Diabetes:Non Diabetic:  <6%Goal of Therapy: <7%Additional Action Suggested:  >8%   Hemoglobin A1c higher - he attributes this to multiple stressors including moving and  soon having a baby; will see how Omnipod works.

## 2014-05-30 ENCOUNTER — Ambulatory Visit (INDEPENDENT_AMBULATORY_CARE_PROVIDER_SITE_OTHER)
Admission: RE | Admit: 2014-05-30 | Discharge: 2014-05-30 | Disposition: A | Discharge status: Home / Self Care | Payer: 59 | Source: Ambulatory Visit | Attending: Family | Admitting: Family

## 2014-05-30 ENCOUNTER — Other Ambulatory Visit: Payer: Self-pay | Admitting: Family

## 2014-05-30 DIAGNOSIS — M25512 Pain in left shoulder: Secondary | ICD-10-CM

## 2014-07-02 ENCOUNTER — Other Ambulatory Visit: Payer: Self-pay | Admitting: *Deleted

## 2014-07-02 MED ORDER — INSULIN ASPART 100 UNIT/ML ~~LOC~~ SOLN: 45.0000 [IU] | Freq: Every day | SUBCUTANEOUS | Status: DC

## 2014-07-15 ENCOUNTER — Encounter: Payer: Self-pay | Admitting: Internal Medicine

## 2014-07-28 ENCOUNTER — Ambulatory Visit: Payer: 59 | Admitting: Internal Medicine

## 2014-08-05 ENCOUNTER — Other Ambulatory Visit: Payer: Self-pay | Admitting: *Deleted

## 2014-08-05 ENCOUNTER — Encounter: Payer: Self-pay | Admitting: Internal Medicine

## 2014-08-05 MED ORDER — INSULIN ASPART 100 UNIT/ML ~~LOC~~ SOLN: SUBCUTANEOUS | Status: DC

## 2014-08-06 ENCOUNTER — Other Ambulatory Visit: Payer: Self-pay | Admitting: *Deleted

## 2014-08-06 MED ORDER — INSULIN ASPART 100 UNIT/ML ~~LOC~~ SOLN: SUBCUTANEOUS | Status: DC

## 2014-08-06 NOTE — Telephone Encounter (Signed)
Pt called back with how many units he's using daily in his pump. Up to 75 units daily.

## 2014-08-07 ENCOUNTER — Ambulatory Visit: Payer: Self-pay | Admitting: Pharmacist

## 2014-08-08 ENCOUNTER — Ambulatory Visit: Payer: 59 | Admitting: Internal Medicine

## 2014-08-25 ENCOUNTER — Encounter: Payer: Self-pay | Admitting: Internal Medicine

## 2014-08-25 ENCOUNTER — Ambulatory Visit (INDEPENDENT_AMBULATORY_CARE_PROVIDER_SITE_OTHER): Payer: 59 | Admitting: Internal Medicine

## 2014-08-25 VITALS — BP 100/58 | HR 78 | Temp 98.0°F | Resp 14 | Wt 174.0 lb

## 2014-08-25 DIAGNOSIS — E109 Type 1 diabetes mellitus without complications: Secondary | ICD-10-CM

## 2014-08-25 LAB — HEMOGLOBIN A1C: Hgb A1c MFr Bld: 6.5 % (ref 4.6–6.5)

## 2014-08-25 NOTE — Patient Instructions (Signed)
Please change the current pump settings: - Basal rates:  12 am: 0.50 3 am: 0.850 >> 0.90 6 am: 0.850 >> 0.90 1 pm: 0.90 >> 0.95 3 pm: 0.850 8 pm: 0.850  - Insulin to carb ratio: 1:10 (increase to 12 for breakfast if increasing the ISF does not reduce the lows after meals) - Insulin sensitivity factor: 40 >> 45 - target CBG: 100-115  - Active insulin time: 4h  Please come back for a follow-up appointment in 3 months  Please stop at the lab.  Please come back for a follow-up appointment in 3 months.

## 2014-08-25 NOTE — Progress Notes (Signed)
Patient ID: Mark Barr, male   DOB: Dec 23, 1985, 29 y.o.   MRN: [id number]  HPI: Mark Barr is a 29 y.o.-year-old male, returning for f/u for DM1, dx 83 (at 29 y/o), controlled, w/o complications, on insulin pump since 2000. Last visit 4 mo ago.  Pt was on Medtronic Revel 530 G loaner >> now on a Omnipod since 04/19/2014. He loves it. He had the Dexcom for last ~4 years, loved it, however, he developed a blistering rash at the attaching site >> stopped >> switched back to Medtronic.  Continues Dexcom CGM. He boluses based on the CGM read! He calibrates the CGM twice a day  Last hemoglobin A1c:  Lab Results  Component Value Date   HGBA1C 7.2* 04/28/2014   HGBA1C 6.9* 12/24/2013   HGBA1C 6.1 09/16/2013  Prev. 6.7% (10/2012), previously 6.4%.  Pt checks his sugars 5-8 x when DexCom (80-180 alarms) on, 10-12 x when off, a day and they are   Better (however, he only enters his higher sugars in the pump, and I was only able to review this and the CGM download):    - 2-4:30 am: 80-130 >> 68-217 (104-188) >> 61-187 >> 87-140 >> 54 at 2 am x1, 208, 209 - exercises  - b'fast 5:30 am: 60-345 >> 110 >> 85-185 - 8:30 am: 100-140 >> 100-189 >> 78-302 >> 110-115 >> 98-152 - snack 10:30: 100-150 >> 113-167 >> 93-248 >> n/c - lunchtime 1 pm: varies more 100-220 >> 93-139 >> 67-231 >> 107-139, 208 >> 68-207 - 4 pm: lower: 80-100 >> 103-126 >> 69-203 >> 107 >> 88, 216 - before dinner 7:30-8 pm: 100-150 >> 91-244 >> 44-261 >> 62-150, 250x1 >> 71, 81 - bedtime 9-10 pm: 120-130 >> 147-167 >> 94-280 >> 106-243, 275 >> 81-221, 253 Has lows >> he does not always check them so they are not easily noticeable on his pump download ; he has hypoglycemia awareness at 70s. Highest sugar was 280x1 since last visit  Pump settings: He had to decrease the insulin as he had lows after changing to the Omnipod >> now adjusting back to Minimed:  - Basal rates:  12 am: 0.50 3 am: 0.850 6 am: 0.850  1pm:  0.90   8 pm: 0.850   - bolus wizard: On - Insulin to carb ratio: 1:10 - Insulin sensitivity factor: 40 - target CBG: 100-115  - Active insulin time: 4h - Changes infusion site: q3 days - CGM alarms: 80 and 180Pt's meals are: - Breakfast:  2 eggs + bagel + coffee, sometimes cereals >> sugars spike after this - Lunch: sandwich, fruit (apple), veggies (carrots) and, goldfish - Dinner: chicken, rice, and veggies - Snacks: 2 snacks: almonds, fruit, or cheese crackers  Total daily dose from basal: 42% (18.6 units)  Total daily dose from bolus: 58% (26 units)  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  He does crossfi, 1-1.5 weightlifting + biking. Snack before and after (~20g  carbs)  - no CKD: Lab Results  Component Value Date   BUN 14 12/24/2013   CREATININE 1.1 12/24/2013   - last set of lipids:  Lab Results  Component Value Date   CHOL 171 12/24/2013   HDL 63.20 12/24/2013   LDLCALC 96 12/24/2013   TRIG 60.0 12/24/2013   CHOLHDL 3 12/24/2013   - last eye exam was in 03/2013. No DR.  - no numbness and tingling in  his feet.  He also has a history of Hashimoto thyroiditis - not on Levothyroxine as TFTs have been normal.  Lab Results  Component Value Date   TSH 1.34 12/24/2013   Also has GERD, anxiety and depression.  I reviewed pt's medications, allergies, PMH, social hx, family hx, and changes were documented in the history of present illness. Otherwise, unchanged from my initial visit note.  ROS: Constitutional: no weight gain/loss, no fatigue, no subjective hyperthermia/hypothermia Eyes: no blurry vision, no xerophthalmia ENT: no sore throat, no nodules palpated in throat, no dysphagia/odynophagia, no hoarseness Cardiovascular: no CP/SOB/palpitations/leg swelling Respiratory: no cough/no SOB Gastrointestinal: no N/V/D/C Musculoskeletal: no muscle/joint aches Skin: no rashes Neurological: no  tremors/numbness/tingling/dizziness  PE: BP 100/58 mmHg  Pulse 78  Temp(Src) 98 F (36.7 C) (Oral)  Resp 14  Wt 174 lb (78.926 kg)  SpO2 98% Wt Readings from Last 3 Encounters:  08/25/14 174 lb (78.926 kg)  04/28/14 171 lb (77.565 kg)  02/05/14 166 lb (75.297 kg)   Constitutional: normal weight, well-built, in NAD Eyes: PERRLA, EOMI, no exophthalmos ENT: moist mucous membranes, no thyromegaly, no cervical lymphadenopathy Cardiovascular: RRR, No MRG Respiratory: CTA B Gastrointestinal: abdomen soft, NT, ND, BS+ Musculoskeletal: no deformities, strength intact in all 4 Skin: moist, warm, no rashes  ASSESSMENT: 1. DM1, controlled, without complications - he is doing a great job taking his sugars very frequently and also wearing his Dexcom CGM 100% of the time. The alarms on the CGM are adequate (80 and 180). - Was happy with the Omni pod, however he could not tolerate it due to skin rash. He is determined to retry using it in the near future - patient is doing a great job managing his diabetes, he is very knowledgeable about type 1 diabetes.   PLAN:  1. Patient with long-standing, controlled DM1 - reviewed both pump download with the pt - sugars appearing on the download are his highs (!) and sometimes his lowsas he does not enter his normal sugars in the pump. Advised him to try to enter all of them. - Per review of his CGM report, he has higher sugars from 3 AM to 3 PM after which he drops likely from a combination of exercise (he exercises in late afternoon) or possibly over correction with lunch. He also can drop his sugars after breakfast so he needs to get something sweet to correct, which causes him to have high sugars before lunch >> we will increase his basal rates from 3 AM to 3 PM and will also increase his insulin sensitivity factor to avoid postmeal hypoglycemia. If this does not prevent it, he was advised to increase his breakfast ICR to 1:12. - I advised him not to bolus  based on CGM reads, as the CGM systems are not yet approved for insulin dosage.   Patient Instructions  Please change the current pump settings: - Basal rates:  12 am: 0.50 3 am: 0.850 >> 0.90 6 am: 0.850 >> 0.90 1 pm: 0.90 >> 0.95 3 pm: 0.850  8 pm: 0.850  - Insulin to carb ratio: 1:10 (increase to 12 for breakfast if increasing the ISF does not reduce the lows after meals) - Insulin sensitivity factor: 40 >> 45 - target CBG: 100-115  - Active insulin time: 4h  Please come back for a follow-up appointment in 3 months  Please stop at the lab.  - check HbA1c today - he is due for yearly eye exams - last 03/2013 >> advised to schedule -  Return to clinic in 3 months  - time spent with the patient: 40 min, of which >50% was spent in reviewing pump and CGM downloads and troubleshooting his CBG readings, reviewing his pump settings and developing a plan to avoid future highs and low CBGs.   Office Visit on 08/25/2014  Component Date Value Ref Range Status  . Hgb A1c MFr Bld 08/25/2014 6.5  4.6 - 6.5 % Final   Glycemic Control Guidelines for People with Diabetes:Non Diabetic:  <6%Goal of Therapy: <7%Additional Action Suggested:  >8%   HbA1c is improved!

## 2014-09-02 ENCOUNTER — Ambulatory Visit (INDEPENDENT_AMBULATORY_CARE_PROVIDER_SITE_OTHER): Payer: Self-pay | Admitting: Family Medicine

## 2014-09-02 ENCOUNTER — Ambulatory Visit: Payer: 59 | Admitting: Pharmacist

## 2014-09-02 VITALS — BP 104/68 | Ht 69.0 in | Wt 170.0 lb

## 2014-09-02 DIAGNOSIS — E109 Type 1 diabetes mellitus without complications: Secondary | ICD-10-CM

## 2014-09-02 NOTE — Progress Notes (Signed)
Subjective:  Patient presents today for 6 month diabetes follow-up as part of the employer-sponsored Link to Wellness program.  Current diabetes regimen includes Novolog via pump.  ASA, ACE Inhibitor and statin are not indicated at this time.  Most recent MD follow-up was  April 2016.  Patient has a pending appt for July 2016.  No med changes or major health changes at this time.    Assessment/Plan:  Patient is a 29 yo male with DM type 1.  Most recent A1C was 6.5% which is at goal of less than 7%. Weight is stable since last visit.  Patient is a well managed diabetic with excellent self-monitoring habits, testing 3-4 times daily when using CGM and 10-12 times daily otherwise.  He is seen regularly by endo and makes adjustments to basal rates as needed.  He has no complaints today and is pleased with improved A1c as evidenced at last endo appt.  Eye and dental exams are up to date.  No signs of neuropathy.     Lifestyle:  Physical Activity- Biking, running, lifting wt, 5 days per week  Nutrition-  No changes at this time, patient does not wish to set any specific goals and feels diet remains under good control.  He continues carb counting prior to each meal.    Goals for Next Visit:  1)  Continue to make healthy dietary choices 2)  Continue to exercise at least 5 days per week 3)  Great job with continued glucose monitoring 4)  Follow-up in 6 months on Wednesday Oct 19th @ 8:30 am  Great to see you today!    Orlin HildingElizabeth Gerilynn Mccullars, PharmD Link to [facility]

## 2014-09-02 NOTE — Patient Instructions (Addendum)
1)  Continue to make healthy dietary choices 2)  Continue to exercise at least 5 days per week 3)  Great job with continued glucose monitoring 4)  Follow-up in 6 months on Wednesday Oct 19th @ 8:30 am  Great to see you today!

## 2014-09-09 NOTE — Progress Notes (Signed)
Patient ID: Mark Barr, male   DOB: 01-03-1986, 29 y.o.   MRN: [id number] ATTENDING PHYSICIAN NOTE: I have reviewed the chart and agree with the plan as detailed above. Denny LevySara Neal MD Pager [phone]

## 2014-10-21 ENCOUNTER — Encounter: Payer: Self-pay | Admitting: Pharmacist

## 2014-11-10 ENCOUNTER — Other Ambulatory Visit: Payer: Self-pay

## 2014-11-12 ENCOUNTER — Other Ambulatory Visit: Payer: Self-pay | Admitting: *Deleted

## 2014-11-12 MED ORDER — GLUCOSE BLOOD VI STRP: ORAL_STRIP | Status: DC

## 2014-11-25 ENCOUNTER — Ambulatory Visit: Payer: 59 | Admitting: Internal Medicine

## 2014-12-14 ENCOUNTER — Other Ambulatory Visit: Payer: Self-pay | Admitting: Family

## 2014-12-14 MED ORDER — AZITHROMYCIN 250 MG PO TABS: 250.0000 mg | ORAL_TABLET | Freq: Every day | ORAL | Status: DC

## 2015-01-23 ENCOUNTER — Other Ambulatory Visit: Payer: Self-pay | Admitting: *Deleted

## 2015-01-23 MED ORDER — INSULIN ASPART 100 UNIT/ML ~~LOC~~ SOLN: SUBCUTANEOUS | Status: DC

## 2015-02-19 ENCOUNTER — Encounter: Payer: Self-pay | Admitting: Internal Medicine

## 2015-02-19 ENCOUNTER — Ambulatory Visit (INDEPENDENT_AMBULATORY_CARE_PROVIDER_SITE_OTHER): Payer: 59 | Admitting: Internal Medicine

## 2015-02-19 VITALS — BP 122/64 | HR 77 | Temp 97.9°F | Resp 12 | Wt 175.6 lb

## 2015-02-19 DIAGNOSIS — E109 Type 1 diabetes mellitus without complications: Secondary | ICD-10-CM | POA: Diagnosis not present

## 2015-02-19 NOTE — Patient Instructions (Addendum)
Please continue: - Basal rates:  12 am: 0.60 3 am: 0.9 8 pm: 0.85 - Insulin to carb ratio: 1:10  - Insulin sensitivity factor: 40 - target CBG: 100-115  - Active insulin time: 4h  - CGM alarms: 80 and 180  Please come back for labs.  Please schedule a new eye appt.  Please come back for a follow-up appointment in 3 months.

## 2015-02-19 NOTE — Progress Notes (Signed)
Patient ID: Mark Barr, male   DOB: 1986-04-06, 29 y.o.   MRN: [id number]  HPI: Mark Barr is a 29 y.o.-year-old male, returning for f/u for DM1, dx 65 (at 29 y/o), controlled, w/o complications, on insulin pump since 2000. Last visit 6 mo ago.  Pt was on Medtronic Revel [id number] G loaner, then on a Omnipod since12/09/2013, loved it, however, he developed a blistering rash at the attaching site >> stopped >> switched back to Medtronic >> now on Omnipod again, and sprays Flonase before attaching the pump - no inflammation at the site.  He had the Dexcom CGM for last ~4.5 years. He boluses based on the CGM read. He calibrates the CGM twice a day. He tried the [facility] sensor and found that this was very inaccurate for him.  Last hemoglobin A1c:  Lab Results  Component Value Date   HGBA1C 6.5 08/25/2014   HGBA1C 7.2* 04/28/2014   HGBA1C 6.9* 12/24/2013  Prev. 6.7% (10/2012), previously 6.4%.  Pt checks his sugars 5-8 x when on DexCom (80-180 alarms) on, 10-12 x when off (however, he only enters his higher sugars in the pump, and I was only able to review this and the CGM traces: - 2-4:30 am: 80-130 >> 68-217 (104-188) >> 61-187 >> 87-140 >> 54 at 2 am x1, 208, 209 >> 39 x1, 100-120, 176 - exercises (crossfit) >> sugars in the upper 200s:95-196, 270 - b'fast 5:30 am: 60-345 >> 110 >> 85-185 >> 108-129, 297 - snack 10:30: 100-150 >> 113-167 >> 93-248 >> n/c - lunchtime 1 pm: varies more 100-220 >> 93-139 >> 67-231 >> 107-139, 208 >> 68-207 >> 90-144, 160 - 4 pm: lower: 80-100 >> 103-126 >> 69-203 >> 107 >> 88, 216 >> 86-148 - before dinner 7:30-8 pm: 100-150 >> 91-244 >> 44-261 >> 62-150, 250x1 >> 71, 81 >> 108-126 - bedtime 9-10 pm: 120-130 >> 147-167 >> 94-280 >> 106-243, 275 >> 81-221, 253 >> 129-200, 217 Has lows >> not entering them in the pump despite repeated advice to do so; he has hypoglycemia awareness at 70s. Highest sugar was 297x1 since last visit  Pump settings: He had to  decrease the insulin as he had lows after changing to the Omnipod >> now adjusting back to Minimed:  - Basal rates:  12 am: 0.50 >> 0.60 3 am: 0.9 8 pm: 0.850  - Insulin to carb ratio: 1:10  - Insulin sensitivity factor: 40 - target CBG: 100-115  - Active insulin time: 4h  - CGM alarms: 80 and 180  Pt's meals are: - Breakfast:  2 eggs + bagel + coffee, sometimes cereals >> sugars spike after this - Lunch: sandwich, fruit (apple), veggies (carrots) and, goldfish - Dinner: chicken, rice, and veggies - Snacks: 2 snacks: almonds, fruit, or cheese crackers Total daily dose from basal: 35-49% ave 44% Total daily dose from bolus: 29-61% ave 56% Up to 60 units a day.  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  He does crossfi, 1-1.5 weightlifting + biking. Snack before and after (~20g  carbs)  - no CKD: Lab Results  Component Value Date   BUN 14 12/24/2013   CREATININE 1.1 12/24/2013   - last set of lipids:  Lab Results  Component Value Date   CHOL 171 12/24/2013   HDL 63.20 12/24/2013   LDLCALC 96 12/24/2013   TRIG 60.0 12/24/2013   CHOLHDL  3 12/24/2013   - last eye exam was in 03/2013. No DR.  - no numbness and tingling in his feet.  He also has a history of Hashimoto thyroiditis - not on Levothyroxine as TFTs have been normal.  Lab Results  Component Value Date   TSH 1.34 12/24/2013   I reviewed pt's medications, allergies, PMH, social hx, family hx, and changes were documented in the history of present illness. Otherwise, unchanged from my initial visit note.  ROS: Constitutional: no weight gain/loss, no fatigue, no subjective hyperthermia/hypothermia Eyes: no blurry vision, no xerophthalmia ENT: no sore throat, no nodules palpated in throat, no dysphagia/odynophagia, no hoarseness Cardiovascular: no CP/SOB/palpitations/leg swelling Respiratory: no cough/no SOB Gastrointestinal: no  N/V/D/C Musculoskeletal: no muscle/joint aches Skin: no rashes Neurological: no tremors/numbness/tingling/dizziness  PE: BP 122/64 mmHg  Pulse 77  Temp(Src) 97.9 F (36.6 C) (Oral)  Resp 12  Wt 175 lb 9.6 oz (79.652 kg)  SpO2 98% Body mass index is 25.92 kg/(m^2). Wt Readings from Last 3 Encounters:  02/19/15 175 lb 9.6 oz (79.652 kg)  09/02/14 170 lb (77.111 kg)  08/25/14 174 lb (78.926 kg)   Constitutional: normal weight, well-built, in NAD Eyes: PERRLA, EOMI, no exophthalmos ENT: moist mucous membranes, no thyromegaly, no cervical lymphadenopathy Cardiovascular: RRR, No MRG Respiratory: CTA B Gastrointestinal: abdomen soft, NT, ND, BS+ Musculoskeletal: no deformities, strength intact in all 4 Skin: moist, warm, no rashes  ASSESSMENT: 1. DM1, controlled, without complications - he is doing a great job taking his sugars very frequently and also wearing his Dexcom CGM 100% of the time. The alarms on the CGM are adequate (80 and 180).   PLAN:  1. Patient with long-standing, controlled DM1 - reviewed both pump download and CGM traces with the pt - sugars appearing on the download are his highs (!) and sometimes his lows as he does not enter his normal sugars in the pump. Again dvised him to try to enter all of them. - Per review of his CGM report, he has higher sugars after exercise in a.m., and he is still adjusting his basal rates for this. Also, he gets slightly higher after lunch and dinner, but usually lower than 180. I did not recommend to decrease his insulin to carb ratio since he is at risk for lows especially in the days that he exercises. - patient is very knowledgeable about type 1 diabetes management, and he continues to adjust his pump settings depending on his readings - continue to bolus based on CGM reads, as the Dexcom CGM system is now approved for insulin dosage.   Patient Instructions   Please continue: - Basal rates:  12 am: 0.60 3 am: 0.9 8 pm: 0.85 -  Insulin to carb ratio: 1:10  - Insulin sensitivity factor: 40 - target CBG: 100-115  - Active insulin time: 4h  - CGM alarms: 80 and 180  Please come back for labs.  Please schedule a new eye appt.  Please come back for a follow-up appointment in 3 months.  - check HbA1c today >> 6.8% - We'll also check ACR, CMP, Lipids, TSH - he is due for yearly eye exams - last 03/2013 >> advised to schedule! - Return to clinic in 3 months  - time spent with the patient: 40 min, of which >50% was spent in reviewing pump and CGM downloads and troubleshooting his CBG readings, reviewing his pump settings and developing a plan to avoid future highs and low CBGs.  Component  Latest Ref Rng 02/23/2015  Sodium     135 - 145 mEq/L 137  Potassium     3.5 - 5.1 mEq/L 4.1  Chloride     96 - 112 mEq/L 100  CO2     19 - 32 mEq/L 30  Glucose     70 - 99 mg/dL 161 (H)  BUN     6 - 23 mg/dL 9  Creatinine     0.96 - 1.50 mg/dL 0.45  Total Bilirubin     0.2 - 1.2 mg/dL 0.7  Alkaline Phosphatase     39 - 117 U/L 84  AST     0 - 37 U/L 18  ALT     0 - 53 U/L 10  Total Protein     6.0 - 8.3 g/dL 6.9  Albumin     3.5 - 5.2 g/dL 4.1  Calcium     8.4 - 10.5 mg/dL 9.5  GFR     >40.98 mL/min 82.27  Cholesterol     0 - 200 mg/dL 119  Triglycerides     0.0 - 149.0 mg/dL 14.7  HDL Cholesterol     >39.00 mg/dL 82.95  VLDL     0.0 - 62.1 mg/dL 30.8  LDL (calc)     0 - 99 mg/dL 81  Total CHOL/HDL Ratio      3  NonHDL      93.93  Microalb, Ur     0.0 - 1.9 mg/dL 5.9 (H)  Creatinine,U      242.8  MICROALB/CREAT RATIO     0.0 - 30.0 mg/g 2.4  TSH     0.35 - 4.50 uIU/mL 4.50   Msg sent: Dear Ovidio Kin,  Labs are normal, execept a high Glucose. Also, the TSH is at the upper limit of normal, I think I will repeat this when you come back.  Sincerely,  Silvestre Mesi

## 2015-02-23 ENCOUNTER — Other Ambulatory Visit (INDEPENDENT_AMBULATORY_CARE_PROVIDER_SITE_OTHER): Payer: 59

## 2015-02-23 ENCOUNTER — Other Ambulatory Visit: Payer: Self-pay | Admitting: *Deleted

## 2015-02-23 DIAGNOSIS — E109 Type 1 diabetes mellitus without complications: Secondary | ICD-10-CM

## 2015-02-23 LAB — LIPID PANEL
CHOLESTEROL: 148 mg/dL (ref 0–200)
HDL: 53.6 mg/dL (ref >39.00)
LDL Cholesterol: 81 mg/dL (ref 0–99)
NONHDL: 93.93
Total CHOL/HDL Ratio: 3
Triglycerides: 66 mg/dL (ref 0.0–149.0)
VLDL: 13.2 mg/dL (ref 0.0–40.0)

## 2015-02-23 LAB — COMPREHENSIVE METABOLIC PANEL
ALBUMIN: 4.1 g/dL (ref 3.5–5.2)
ALK PHOS: 84 U/L (ref 39–117)
ALT: 10 U/L (ref 0–53)
AST: 18 U/L (ref 0–37)
BILIRUBIN TOTAL: 0.7 mg/dL (ref 0.2–1.2)
BUN: 9 mg/dL (ref 6–23)
CO2: 30 mEq/L (ref 19–32)
Calcium: 9.5 mg/dL (ref 8.4–10.5)
Chloride: 100 mEq/L (ref 96–112)
Creatinine, Ser: 1.12 mg/dL (ref 0.40–1.50)
GFR: 82.27 mL/min (ref >60.00)
GLUCOSE: 206 mg/dL — AB (ref 70–99)
POTASSIUM: 4.1 meq/L (ref 3.5–5.1)
Sodium: 137 mEq/L (ref 135–145)
TOTAL PROTEIN: 6.9 g/dL (ref 6.0–8.3)

## 2015-02-23 LAB — MICROALBUMIN / CREATININE URINE RATIO
Creatinine,U: 242.8 mg/dL
Microalb Creat Ratio: 2.4 mg/g (ref 0.0–30.0)
Microalb, Ur: 5.9 mg/dL — ABNORMAL HIGH (ref 0.0–1.9)

## 2015-02-23 LAB — TSH: TSH: 4.5 u[IU]/mL (ref 0.35–4.50)

## 2015-02-24 ENCOUNTER — Other Ambulatory Visit (INDEPENDENT_AMBULATORY_CARE_PROVIDER_SITE_OTHER): Payer: 59 | Admitting: *Deleted

## 2015-02-24 ENCOUNTER — Encounter: Payer: Self-pay | Admitting: Internal Medicine

## 2015-02-24 ENCOUNTER — Other Ambulatory Visit: Payer: Self-pay | Admitting: Internal Medicine

## 2015-02-24 DIAGNOSIS — E109 Type 1 diabetes mellitus without complications: Secondary | ICD-10-CM

## 2015-02-24 DIAGNOSIS — R7989 Other specified abnormal findings of blood chemistry: Secondary | ICD-10-CM

## 2015-02-24 LAB — POCT GLYCOSYLATED HEMOGLOBIN (HGB A1C): HEMOGLOBIN A1C: 6.8

## 2015-03-04 ENCOUNTER — Ambulatory Visit: Payer: 59 | Admitting: Pharmacist

## 2015-05-21 ENCOUNTER — Ambulatory Visit (INDEPENDENT_AMBULATORY_CARE_PROVIDER_SITE_OTHER): Payer: Self-pay | Admitting: Family Medicine

## 2015-05-21 DIAGNOSIS — E109 Type 1 diabetes mellitus without complications: Secondary | ICD-10-CM

## 2015-05-21 MED FILL — NovoLOG 100 UNIT/ML SOLN: 100 | 40 days supply | Qty: 30 | Fill #2

## 2015-05-21 NOTE — Patient Instructions (Signed)
1)  Continue to make healthy dietary choices 2)  Continue to exercise at least 5 days per week 3)  Great job with continued glucose monitoring 4)  Follow-up in 6 months on Thursday July 6th @ 2:00 pm  Great to see you today!

## 2015-05-21 NOTE — Progress Notes (Signed)
Subjective:  Patient presents today for 6 month diabetes follow-up as part of the employer-sponsored Link to Wellness program.  Current diabetes regimen includes Novolog via pump.  ASA, ACE Inhibitor and statin are not indicated at this time.  Most recent MD follow-up was October 2016.  Patient has a pending appt for Jan 2016.  No med changes or major health changes at this time.   Now using Omnipod pump with freestyle test strips. Uses flonase on surface of skin prior to attaching omnipod to reduce rash/irritation.   Assessment/Plan:  Patient is a 30 yo male with DM type 1.  Most recent A1C was 6.8% which is at goal of less than 7%, but slightly increased vs last reading of 6.5%. Weight is stable since last visit.  Patient is a well managed diabetic with excellent glucose self-monitoring, testing 6-8 times daily when using CGM and 10-12 times daily otherwise.  Dexcom CGM is now approved for use in bolusing which will hopefully further reduce need for testing in this patient.  He is seen regularly by endo and makes adjustments to basal rates as needed.  He has no complaints today.  Dental exam is up to date.  Patient is due for eye exam and will schedule appt soon.  No signs of neuropathy.      Lifestyle:  Physical Activity- 1-1.5 hours at least 5 days per week including crossfit, mountain biking, weights, etc.   Nutrition-  No changes at this time, patient does not wish to set any specific goals and feels diet remains under good control.  He continues carb counting prior to each meal.    Goals for Next Visit:  1)  Continue to make healthy dietary choices 2)  Continue to exercise at least 5 days per week 3)  Great job with continued glucose monitoring 4)  Follow-up in 6 months on Thursday July 6th @ 2:00 pm  Great to see you today!    Orlin HildingElizabeth Ellyanna Holton, PharmD Link to [facility]  [phone]

## 2015-05-28 ENCOUNTER — Ambulatory Visit: Payer: 59 | Admitting: Internal Medicine

## 2015-05-28 NOTE — Progress Notes (Signed)
I have reviewed this pharmacist's note and agree  

## 2015-06-03 MED FILL — FREESTYLE TEST STRIPS: 30 days supply | Qty: 300 | Fill #0

## 2015-06-03 MED FILL — CLINDAMYCIN HCL 300 MG CAPS: 300 | 7 days supply | Qty: 21 | Fill #0

## 2015-06-03 MED FILL — CHLORHEXIDINE 0.12% RINSE: 0.12 | 15 days supply | Qty: 473 | Fill #0

## 2015-06-03 MED FILL — HYDROCODON-APAP 10-325: 10-325 | 5 days supply | Qty: 30 | Fill #0

## 2015-06-05 MED FILL — PROMETHAZINE 12.5 MG TABLET: 12.5 | 3 days supply | Qty: 12 | Fill #0

## 2015-06-25 ENCOUNTER — Ambulatory Visit (INDEPENDENT_AMBULATORY_CARE_PROVIDER_SITE_OTHER): Payer: 59 | Admitting: Internal Medicine

## 2015-06-25 ENCOUNTER — Encounter: Payer: Self-pay | Admitting: Internal Medicine

## 2015-06-25 VITALS — BP 108/60 | HR 60 | Temp 98.3°F | Resp 12 | Wt 169.0 lb

## 2015-06-25 DIAGNOSIS — E109 Type 1 diabetes mellitus without complications: Secondary | ICD-10-CM

## 2015-06-25 DIAGNOSIS — E063 Autoimmune thyroiditis: Secondary | ICD-10-CM | POA: Diagnosis not present

## 2015-06-25 DIAGNOSIS — R7989 Other specified abnormal findings of blood chemistry: Secondary | ICD-10-CM

## 2015-06-25 DIAGNOSIS — R946 Abnormal results of thyroid function studies: Secondary | ICD-10-CM | POA: Diagnosis not present

## 2015-06-25 LAB — T3, FREE: T3 FREE: 3.5 pg/mL (ref 2.3–4.2)

## 2015-06-25 LAB — TSH: TSH: 2.27 u[IU]/mL (ref 0.35–4.50)

## 2015-06-25 LAB — HEMOGLOBIN A1C: Hgb A1c MFr Bld: 7.1 % — ABNORMAL HIGH (ref 4.6–6.5)

## 2015-06-25 LAB — T4, FREE: FREE T4: 0.86 ng/dL (ref 0.60–1.60)

## 2015-06-25 NOTE — Patient Instructions (Signed)
Please stop at the lab.  Please continue: - Basal rates:  12 am: 0.50  3 am: 0.875 5 pm: 0.825 - Insulin to carb ratio: 1:10  - Insulin sensitivity factor: 30 - target CBG: 100-115  - Active insulin time: 3h  - CGM alarms: 80 and 180

## 2015-06-25 NOTE — Progress Notes (Signed)
Patient ID: Mark Barr, male   DOB: 1985/10/11, 30 y.o.   MRN: [id number]  HPI: Mark Barr is a 30 y.o.-year-old male, returning for f/u for DM1, dx 55 (at 30 y/o), controlled, w/o complications, on insulin pump since 2000. Last visit 4 mo ago.  Pt was on Medtronic Revel [id number] G loaner, then on a Omnipod since12/09/2013, loved it, however, he developed a blistering rash at the attaching site (was using Flonase before attaching the pump >> helped) >> now back on Medtronic 723.   He had the Dexcom CGM for last ~5 years. He boluses based on the CGM read. He calibrates the CGM twice a day. He tried the [facility] sensor and found that this was very inaccurate for him.  Last hemoglobin A1c:  Lab Results  Component Value Date   HGBA1C 6.8 02/19/2015   HGBA1C 6.5 08/25/2014   HGBA1C 7.2* 04/28/2014  Prev. 6.7% (10/2012), previously 6.4%.  Pt checks his sugars 5-8 x when on DexCom (80-180 alarms) on, 10-12 x when off - ave 153 +/- 48: - 2-4:30 am: 61-187 >> 87-140 >> 54 at 2 am x1, 208, 209 >> 39 x1, 100-120, 176 >> 103-170, 211 - b'fast 5:30 am: 60-345 >> 110 >> 85-185 >> 108-129, 297 >> see above - 2h after b'fast: 113-259 - lunchtime 1 pm: 93-139 >> 67-231 >> 107-139, 208 >> 68-207 >> 90-144, 160 >> 94-154 - 4 pm: lower: 80-100 >> 103-126 >> 69-203 >> 107 >> 88, 216 >> 86-148 >> 176, 323 - before dinner 7:30-8 pm: 91-244 >> 44-261 >> 62-150, 250x1 >> 71, 81 >> 108-126 >> 108-148. 187 - bedtime 9-10 pm: 147-167 >> 94-280 >> 106-243, 275 >> 81-221, 253 >> 129-200, 217 >> 111-177, 204 Has lows >> 51, 54 after overcorrection for dinner; he has hypoglycemia awareness at 70s.  Highest sugar was 297x1>> 323 (after cereal) since last visit.  Pump settings:   - Basal rates:  12 am: 0.50  3 am: 0.875 5 pm: 0.825 - Insulin to carb ratio: 1:10  - Insulin sensitivity factor: 40 >> 30 - target CBG: 100-115  - Active insulin time: 4h >> 3h  - CGM alarms: 80 and 180 - occasional dual wave  boluses Total daily dose from basal: 46% (18 units) Total daily dose from bolus: 54% (21 units) Uses up to 60 units a day.  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  He does crossfit, weightlifting + biking. Snacks before and after exercise (~20g  Carbs).  - no CKD: Lab Results  Component Value Date   BUN 9 02/23/2015   CREATININE 1.12 02/23/2015  Latest ACR (02/2015): Microalb, Ur     0.0 - 1.9 mg/dL 5.9 (H)  Creatinine,U      242.8  MICROALB/CREAT RATIO     0.0 - 30.0 mg/g 2.4   - last set of lipids:  Lab Results  Component Value Date   CHOL 148 02/23/2015   HDL 53.60 02/23/2015   LDLCALC 81 02/23/2015   TRIG 66.0 02/23/2015   CHOLHDL 3 02/23/2015   - last eye exam was  in 03/2013. No DR. Will see [facility] - no numbness and tingling in his feet.  He also has a history of Hashimoto thyroiditis - not on Levothyroxine as TFTs have been normal.  Lab Results  Component Value Date   TSH 4.50 02/23/2015   I reviewed pt's medications, allergies, PMH, social hx, family hx, and changes were documented in the history of present illness. Otherwise, unchanged from my initial visit note.  ROS: Constitutional: no weight gain/loss, no fatigue, no subjective hyperthermia/hypothermia Eyes: no blurry vision, no xerophthalmia ENT: no sore throat, no nodules palpated in throat, no dysphagia/odynophagia, no hoarseness Cardiovascular: no CP/SOB/palpitations/leg swelling Respiratory: no cough/no SOB Gastrointestinal: no N/V/D/C Musculoskeletal: no muscle/joint aches Skin: no rashes Neurological:  no tremors/numbness/tingling/dizziness  PE: BP 108/60 mmHg  Pulse 60  Temp(Src) 98.3 F (36.8 C) (Oral)  Resp 12  Wt 169 lb (76.658 kg)  SpO2 98% Body mass index is 24.95 kg/(m^2). Wt Readings from Last 3 Encounters:  06/25/15 169 lb (76.658 kg)  02/19/15 175 lb 9.6 oz (79.652 kg)  09/02/14 170 lb (77.111 kg)   Constitutional: normal weight, well-built, in NAD Eyes: PERRLA, EOMI, no exophthalmos ENT: moist mucous membranes, no thyromegaly, no cervical lymphadenopathy Cardiovascular: RRR, No MRG Respiratory: CTA B Gastrointestinal: abdomen soft, NT, ND, BS+ Musculoskeletal: no deformities, strength intact in all 4 Skin: moist, warm, no rashes  ASSESSMENT: 1. DM1, controlled, without complications - he is doing a great job taking his sugars very frequently and also wearing his Dexcom CGM 100% of the time. The alarms on the CGM are adequate (80 and 180).  2. Elevated TSH (in the normal range)  PLAN:  1. Patient with long-standing, controlled DM1 - reviewed both pump download and CGM traces with the pt - sugars have very little variability in the last month. He had episodes of URI over last 2 months. He also has slightly higher sugars when he exercises (crossfit), however, he does not have as many lows anymore (only has 2, in the 50s in the last mo) and he is very happy about this!  - Per review of his CGM report, he has higher sugars after exercise in a.m., and he has recently decreased his ISF from 40>> 30. Will maintain this. He also decreased his active insulin time from 4 >> 3h. As he does not have lows >> will maintain this. - patient is very knowledgeable about type 1 diabetes management, and he continues to adjust his pump settings depending on his readings - continue to bolus based on CGM reads, as the Dexcom CGM system is now approved for insulin dosage.   Patient Instructions  Please stop at the lab.  Please continue: - Basal rates:  12 am: 0.50  3 am: 0.875 5 pm:  0.825 - Insulin to carb ratio: 1:10  - Insulin sensitivity factor: 30 - target CBG: 100-115  - Active insulin time: 3h  - CGM alarms: 80 and 180  - check HbA1c today  - he is due for yearly eye exams - last 03/2013 >> advised to schedule! - Return to clinic in 3 months  2. Slightly elevated TSH (in the normal range) - he had a TSH at the [facility] at last visit: 4.5 - will repeat today along with thyroid AB levels  - time spent with the patient: 40 min, of which >50% was spent in reviewing pump and CGM downloads and troubleshooting his CBG readings, reviewing his pump settings and developing a plan to avoid future highs  and low CBGs.   Office Visit on 06/25/2015  Component Date Value Ref Range Status  . Hgb A1c MFr Bld 06/25/2015 7.1* 4.6 - 6.5 % Final   Glycemic Control Guidelines for People with Diabetes:Non Diabetic:  <6%Goal of Therapy: <7%Additional Action Suggested:  >8%   . T3, Free 06/25/2015 3.5  2.3 - 4.2 pg/mL Final  . Free T4 06/25/2015 0.86  0.60 - 1.60 ng/dL Final  . TSH 54/01/8118 2.27  0.35 - 4.50 uIU/mL Final  . Thyroperoxidase Ab SerPl-aCnc 06/25/2015 75* <9 IU/mL Final  . Thyroglobulin Ab 06/25/2015 4* <2 IU/mL Final   HbA1c slightly higher than target. We did discuss at the time of the visit that this is preferable to a low HbA1c obtained in the setting of low CBGs.  The thyroid antibodies are positive, confirming Hashimoto's thyroiditis. Nothing to do for now, since the thyroid function tests are normal, but we have to keep a close eye on his thyroid tests.

## 2015-06-26 DIAGNOSIS — E063 Autoimmune thyroiditis: Secondary | ICD-10-CM | POA: Insufficient documentation

## 2015-06-26 LAB — THYROGLOBULIN ANTIBODY: THYROGLOBULIN AB: 4 [IU]/mL — AB (ref <2)

## 2015-06-26 LAB — THYROID PEROXIDASE ANTIBODY: Thyroperoxidase Ab SerPl-aCnc: 75 IU/mL — ABNORMAL HIGH (ref <9)

## 2015-07-08 DIAGNOSIS — E109 Type 1 diabetes mellitus without complications: Secondary | ICD-10-CM | POA: Diagnosis not present

## 2015-07-15 MED FILL — NovoLOG 100 UNIT/ML SOLN: 100 | 40 days supply | Qty: 30 | Fill #3

## 2015-07-15 MED FILL — CONTOUR NEXT STRIPS: 30 days supply | Qty: 300 | Fill #3

## 2015-08-06 ENCOUNTER — Other Ambulatory Visit: Payer: Self-pay | Admitting: Sports Medicine

## 2015-08-06 NOTE — Progress Notes (Signed)
  Mark Barr - 30 y.o. male MRN [id number]  Date of birth: 1985-11-04  SUBJECTIVE: CC: Cough & Congestion     HPI: 2 weeks of worsening cough congestion, and increased work of breathing. Pt seen outside of office. NAD however somewhat ill appearing. Non toxic. No increased work of breathing     ROS:    HISTORY:  Past Medical, Surgical, Social, and Family History reviewed & updated per EMR.  Pertinent Historical Findings include: Type 1 Diabetes   ASSESSMENT & PLAN: See problem based charting & AVS for additional documentation Problem List Items Addressed This Visit    None     Acute URI. [facility]

## 2015-08-07 MED FILL — AZITHROMYCIN 250 MG TABLET: 250 | 5 days supply | Qty: 6 | Fill #0

## 2015-08-13 DIAGNOSIS — F411 Generalized anxiety disorder: Secondary | ICD-10-CM | POA: Diagnosis not present

## 2015-08-18 ENCOUNTER — Other Ambulatory Visit: Payer: Self-pay | Admitting: Internal Medicine

## 2015-08-18 MED FILL — FLUoxetine HCL 40 MG CAPS: 40 | 90 days supply | Qty: 90 | Fill #0

## 2015-08-18 MED FILL — NovoLOG 100 UNIT/ML SOLN: 100 | 53 days supply | Qty: 40 | Fill #0

## 2015-08-31 MED FILL — CONTOUR NEXT STRIPS: 30 days supply | Qty: 300 | Fill #4

## 2015-09-01 DIAGNOSIS — E109 Type 1 diabetes mellitus without complications: Secondary | ICD-10-CM | POA: Diagnosis not present

## 2015-09-11 ENCOUNTER — Encounter: Payer: Self-pay | Admitting: Internal Medicine

## 2015-09-14 DIAGNOSIS — E109 Type 1 diabetes mellitus without complications: Secondary | ICD-10-CM | POA: Diagnosis not present

## 2015-10-01 ENCOUNTER — Encounter: Payer: Self-pay | Admitting: Internal Medicine

## 2015-10-01 ENCOUNTER — Ambulatory Visit (INDEPENDENT_AMBULATORY_CARE_PROVIDER_SITE_OTHER): Payer: 59 | Admitting: Internal Medicine

## 2015-10-01 VITALS — BP 122/80 | HR 62 | Wt 174.0 lb

## 2015-10-01 DIAGNOSIS — E109 Type 1 diabetes mellitus without complications: Secondary | ICD-10-CM | POA: Diagnosis not present

## 2015-10-01 DIAGNOSIS — E063 Autoimmune thyroiditis: Secondary | ICD-10-CM

## 2015-10-01 LAB — POCT GLYCOSYLATED HEMOGLOBIN (HGB A1C): HEMOGLOBIN A1C: 6.5

## 2015-10-01 NOTE — Patient Instructions (Signed)
Please continue: - Basal rates:  12 am: 0.50  3 am: 0.875 5:30: 0.900 5 pm: 0.825 7 pm: 0.800 - Insulin to carb ratio: 1:10  - Insulin sensitivity factor: 30 - target CBG: 100-115  - Active insulin time: 3h  - CGM alarms: 80 and 180  Please return in 3 months with your sugar log.

## 2015-10-01 NOTE — Progress Notes (Signed)
Patient ID: Mark Barr, male   DOB: 06-23-1985, 30 y.o.   MRN: [id number]  HPI: Mark Barr is a 30 y.o.-year-old male, returning for f/u for DM1, dx 30 (at 30 y/o), controlled, w/o complications, on insulin pump since 2000. Last visit 4 mo ago.  Pt was on Medtronic Revel [id number] G loaner, then on a Omnipod since12/09/2013, loved it, however, he developed a blistering rash at the attaching site (was using Flonase before attaching the pump >> helped) >> now back on Medtronic 723.   He has a Dexcom CGM for last ~5.5 years. He boluses based on the CGM read. He calibrates the CGM twice a day. He tried the [facility] sensor and found that this was very inaccurate for him.  He recently tried a new Medtronic pump, 670 G and he loves it. He only had it for 1 week and he is trying to obtaining through his insurance, however, this has proven to be very difficult.  Last hemoglobin A1c:  Lab Results  Component Value Date   HGBA1C 6.5 10/01/2015   HGBA1C 7.1* 06/25/2015   HGBA1C 6.8 02/19/2015  Prev. 6.7% (10/2012), previously 6.4%.  Pt checks his sugars 5-8 x when on DexCom (80-180 alarms) on, 10-12 x when off - ave 153 +/- 48 >> 157 +/- 60: - 2-4:30 am: 61-187 >> 87-140 >> 54 at 2 am x1, 208, 209 >> 39 x1, 100-120, 176 >> 103-170, 211 >> 80-191 - b'fast 5:30 am: 60-345 >> 110 >> 85-185 >> 108-129, 297 >> see above - 2h after b'fast: 113-259 >> 100-151, 201 - lunchtime 1 pm: 93-139 >> 67-231 >> 107-139, 208 >> 68-207 >> 90-144, 160 >> 94-154 >> 100-124, 186 - 4 pm: lower: 80-100 >> 103-126 >> 69-203 >> 107 >> 88, 216 >> 86-148 >> 176, 323 >> 210, 220 - before dinner 7:30-8 pm: 91-244 >> 44-261 >> 62-150, 250x1 >> 71, 81 >> 108-126 >> 108-148. 187 >> 78-125 - bedtime 9-10 pm: 94-280 >> 106-243, 275 >> 81-221, 253 >> 129-200, 217 >> 111-177, 204 >> 107-193, 232 Has lows >> 51, 54 >> 40s at night; no hypoglycemia awareness at 70s.  Highest sugar was 297x1>> 323 (after cereal) >> 309x1 since last  visit.  Pump settings:   - Basal rates:  12 am: 0.50  3 am: 0.875 5:30: 0.900 5 pm: 0.825 7 pm: 0.800 - Insulin to carb ratio: 1:10  - Insulin sensitivity factor: 30 - target CBG: 100-115  - Active insulin time: 3h  - CGM alarms: 80 and 180 - occasional dual wave boluses Total daily dose from basal: 43% (18.5 units) Total daily dose from bolus: 57% (24.6 units) Uses up to 60 units a day.  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  He does crossfit, weightlifting + biking. Snacks before and after exercise (~20g  Carbs).  - no CKD: Lab Results  Component Value Date   BUN 9 02/23/2015   CREATININE 1.12 02/23/2015  Latest ACR (02/2015): Microalb, Ur     0.0 - 1.9 mg/dL 5.9 (H)  Creatinine,U      242.8  MICROALB/CREAT RATIO     0.0 - 30.0 mg/g 2.4   - last set of lipids:  Lab Results  Component Value Date   CHOL 148 02/23/2015   HDL 53.60 02/23/2015   LDLCALC 81 02/23/2015   TRIG 66.0 02/23/2015   CHOLHDL 3 02/23/2015   - last eye exam was in 03/2013. No DR. Will see [facility] - no numbness and tingling in his feet.  He also has a history of Hashimoto thyroiditis - not on Levothyroxine as TFTs have been normal.  Lab Results  Component Value Date   TSH 2.27 06/25/2015   I reviewed pt's medications, allergies, PMH, social hx, family hx, and changes were documented in the history of present illness. Otherwise, unchanged from my initial visit note.  His wife is pregnant and they would have baby #2 in November.  ROS: Constitutional: no weight gain/loss, no fatigue, no subjective  hyperthermia/hypothermia Eyes: no blurry vision, no xerophthalmia ENT: no sore throat, no nodules palpated in throat, no dysphagia/odynophagia, no hoarseness Cardiovascular: no CP/SOB/palpitations/leg swelling Respiratory: no cough/no SOB Gastrointestinal: no N/V/D/C Musculoskeletal: no muscle/joint aches Skin: no rashes Neurological: no tremors/numbness/tingling/dizziness  PE: BP 122/80 mmHg  Pulse 62  Wt 174 lb (78.926 kg)  SpO2 98% Body mass index is 25.68 kg/(m^2). Wt Readings from Last 3 Encounters:  10/01/15 174 lb (78.926 kg)  06/25/15 169 lb (76.658 kg)  02/19/15 175 lb 9.6 oz (79.652 kg)   Constitutional: normal weight, well-built, in NAD Eyes: PERRLA, EOMI, no exophthalmos ENT: moist mucous membranes, no thyromegaly, no cervical lymphadenopathy Cardiovascular: RRR, No MRG Respiratory: CTA B Gastrointestinal: abdomen soft, NT, ND, BS+ Musculoskeletal: no deformities, strength intact in all 4 Skin: moist, warm, no rashes  ASSESSMENT: 1. DM1, controlled, without complications except hypoglycemia - he is doing a great job taking his sugars very frequently and also wearing his Dexcom CGM 100% of the time. The alarms on the CGM are adequate (80 and 180).  2. Hashimoto thyroiditis - Euthyroid  PLAN:  1. Patient with long-standing, controlled DM1, but with hypoglycemia episodes - reviewed both pump download and CGM traces with the pt - Overall control is good, however, he is still having low blood sugars at night, especially after exercise, despite lowering the basal rates in this context. - He tried the new Medtronic 670 G insulin pump integrated with the new CGM and he loves it. However, since he has 6 more months of warranty for his current Paradigm 723 pump, his insurance is not approving him to obtain the new pump. I will have to do a peer-to-peer review for him >> initiated right after the visit - patient is very knowledgeable about type 1 diabetes management, and he  continues to adjust his pump settings depending on his readings - continue to bolus based on CGM reads, as the Dexcom CGM system is now approved for insulin dosage.  - We'll continue current insulin settings for now, pending him getting the new pump: Patient Instructions  Please continue: - Basal rates:  12 am: 0.50  3 am: 0.875 5:30: 0.900 5 pm: 0.825 7 pm: 0.800 - Insulin to carb ratio: 1:10  - Insulin sensitivity factor: 30 - target CBG: 100-115  - Active insulin time: 3h  - CGM alarms: 80 and 180  - check HbA1c today >> 6.5% (great!) - he is due for yearly eye exams  - Return to clinic in 3 months  2. Hashimoto's thyroiditis - normal TFTS - will continue to follow

## 2015-10-16 MED FILL — CONTOUR NEXT STRIPS: 30 days supply | Qty: 300 | Fill #5

## 2015-10-16 MED FILL — NovoLOG 100 UNIT/ML SOLN: 100 | 53 days supply | Qty: 40 | Fill #1

## 2015-11-13 DIAGNOSIS — E109 Type 1 diabetes mellitus without complications: Secondary | ICD-10-CM | POA: Diagnosis not present

## 2015-11-19 ENCOUNTER — Ambulatory Visit: Payer: Self-pay | Admitting: Pharmacist

## 2015-11-23 DIAGNOSIS — E109 Type 1 diabetes mellitus without complications: Secondary | ICD-10-CM | POA: Diagnosis not present

## 2015-11-24 DIAGNOSIS — E109 Type 1 diabetes mellitus without complications: Secondary | ICD-10-CM | POA: Diagnosis not present

## 2015-12-07 ENCOUNTER — Other Ambulatory Visit: Payer: Self-pay

## 2015-12-07 MED ORDER — GLUCOSE BLOOD VI STRP: ORAL_STRIP | 11 refills | Status: DC

## 2015-12-07 MED FILL — FLUoxetine HCL 40 MG CAPS: 40 | 90 days supply | Qty: 90 | Fill #1

## 2015-12-07 MED FILL — CONTOUR NEXT STRIPS: 30 days supply | Qty: 300 | Fill #0

## 2015-12-07 NOTE — Telephone Encounter (Signed)
Refill request for Contour test strips. Rx sent electronically.

## 2015-12-31 ENCOUNTER — Other Ambulatory Visit: Payer: Self-pay

## 2015-12-31 ENCOUNTER — Ambulatory Visit: Payer: 59 | Admitting: Internal Medicine

## 2015-12-31 ENCOUNTER — Encounter: Payer: Self-pay | Admitting: Internal Medicine

## 2015-12-31 MED ORDER — INSULIN ASPART 100 UNIT/ML ~~LOC~~ SOLN: SUBCUTANEOUS | 1 refills | Status: DC

## 2015-12-31 MED FILL — NovoLOG 100 UNIT/ML SOLN: 100 | 53 days supply | Qty: 30 | Fill #0

## 2016-01-14 ENCOUNTER — Encounter: Payer: Self-pay | Admitting: Pharmacist

## 2016-01-14 ENCOUNTER — Other Ambulatory Visit: Payer: Self-pay | Admitting: Pharmacist

## 2016-01-14 VITALS — BP 108/64 | Wt 171.0 lb

## 2016-01-14 DIAGNOSIS — E109 Type 1 diabetes mellitus without complications: Secondary | ICD-10-CM

## 2016-01-14 NOTE — Patient Outreach (Signed)
[facility] Care Management  01/14/2016  Mark Barr 09/30/1985 [id number]   Subjective:  Patient presents today for 6 month diabetes follow-up as part of the employer-sponsored Link to Wellness program. Current diabetes regimen includes Novolog via pump. ASA, ACE Inhibitor and statin are not indicated at this time. Most recent MD follow-up was May 2017. Patient has a pending appt for Oct 2017. [facility] and is satisfied with this new meter. No med changes or major health changes at this time.   Assessment/Plan:  Patient is a 30 yo male with DM type 1.  Most recent A1C was 6.5% (prev 6.8%) which is at goal of less than 7%, and improved vs last reading. Weight is stable since last visit.  Patient is a well managed diabetic with excellent glucose self-monitoring, testing 4-5 times daily when using CGM and 10-12 times daily otherwise.  He has now switched to Medtronic CGM as it is compatible with Medtronic 670g pump.  He is still able to bolus from CGM readings as he was with Dexcom.  He is seen regularly by endo and makes adjustments to basal rates as needed.  He has no complaints today.  Dental exam is up to date.  Patient has not had a recent eye exam.  No signs of neuropathy.      Lifestyle:  Physical Activity- 1-1.5 hours at least 5 days per week including crossfit, mountain biking, weights, etc.   Nutrition-  No changes at this time, patient does not wish to set any specific goals and feels diet remains under good control.  He continues carb counting prior to each meal. No special diets.    Goals for Next Visit:  1)  Continue to make healthy dietary choices 2)  Continue to exercise at least 5 days per week 3)  Great job with continued glucose monitoring 4)  Follow-up in 6 months on Tuesday Feb, 27th @ 1:00 pm   Great to see you today!  Orlin HildingElizabeth Teo Moede, PharmD [facility]  [phone]

## 2016-01-29 DIAGNOSIS — F411 Generalized anxiety disorder: Secondary | ICD-10-CM | POA: Diagnosis not present

## 2016-01-29 MED FILL — FLUoxetine HCL 20 MG CAPS: 20 | 28 days supply | Qty: 21 | Fill #0

## 2016-02-04 DIAGNOSIS — E109 Type 1 diabetes mellitus without complications: Secondary | ICD-10-CM | POA: Diagnosis not present

## 2016-02-10 MED FILL — CONTOUR NEXT STRIPS: 30 days supply | Qty: 300 | Fill #1

## 2016-02-10 MED FILL — NovoLOG 100 UNIT/ML SOLN: 100 | 53 days supply | Qty: 30 | Fill #1

## 2016-02-15 DIAGNOSIS — E109 Type 1 diabetes mellitus without complications: Secondary | ICD-10-CM | POA: Diagnosis not present

## 2016-02-26 ENCOUNTER — Encounter: Payer: Self-pay | Admitting: Internal Medicine

## 2016-02-26 ENCOUNTER — Ambulatory Visit (INDEPENDENT_AMBULATORY_CARE_PROVIDER_SITE_OTHER): Payer: 59 | Admitting: Internal Medicine

## 2016-02-26 VITALS — BP 120/72 | HR 62 | Ht 69.5 in | Wt 173.0 lb

## 2016-02-26 DIAGNOSIS — E109 Type 1 diabetes mellitus without complications: Secondary | ICD-10-CM

## 2016-02-26 DIAGNOSIS — E063 Autoimmune thyroiditis: Secondary | ICD-10-CM

## 2016-02-26 LAB — COMPLETE METABOLIC PANEL WITH GFR
ALBUMIN: 4.5 g/dL (ref 3.6–5.1)
ALT: 12 U/L (ref 9–46)
AST: 20 U/L (ref 10–40)
Alkaline Phosphatase: 66 U/L (ref 40–115)
BILIRUBIN TOTAL: 0.8 mg/dL (ref 0.2–1.2)
BUN: 11 mg/dL (ref 7–25)
CO2: 26 mmol/L (ref 20–31)
CREATININE: 0.96 mg/dL (ref 0.60–1.35)
Calcium: 9.4 mg/dL (ref 8.6–10.3)
Chloride: 101 mmol/L (ref 98–110)
GFR, Est Non African American: >89 mL/min (ref >=60)
Glucose, Bld: 133 mg/dL — ABNORMAL HIGH (ref 65–99)
Potassium: 4.2 mmol/L (ref 3.5–5.3)
Sodium: 139 mmol/L (ref 135–146)
TOTAL PROTEIN: 6.9 g/dL (ref 6.1–8.1)

## 2016-02-26 LAB — LIPID PANEL
CHOL/HDL RATIO: 3
Cholesterol: 167 mg/dL (ref 0–200)
HDL: 60.8 mg/dL (ref >39.00)
LDL Cholesterol: 95 mg/dL (ref 0–99)
NONHDL: 106.61
TRIGLYCERIDES: 56 mg/dL (ref 0.0–149.0)
VLDL: 11.2 mg/dL (ref 0.0–40.0)

## 2016-02-26 LAB — MICROALBUMIN / CREATININE URINE RATIO
Creatinine,U: 74.8 mg/dL
MICROALB/CREAT RATIO: 0.9 mg/g (ref 0.0–30.0)
Microalb, Ur: <0.7 mg/dL (ref 0.0–1.9)

## 2016-02-26 LAB — TSH: TSH: 2.24 u[IU]/mL (ref 0.35–4.50)

## 2016-02-26 LAB — HEMOGLOBIN A1C: HEMOGLOBIN A1C: 6.6 % — AB (ref 4.6–6.5)

## 2016-02-26 LAB — T4, FREE: FREE T4: 0.75 ng/dL (ref 0.60–1.60)

## 2016-02-26 LAB — T3, FREE: T3, Free: 3.3 pg/mL (ref 2.3–4.2)

## 2016-02-26 NOTE — Patient Instructions (Addendum)
Please continue: - Basal rates:  12 am: 0.475 3 am: 0.950 5:30: 1.00 5 pm: 0.850 7:30 pm: 0.800 - Insulin to carb ratio: 1:10 except: 5:30 am-5 pm >> 9 - Insulin sensitivity factor: 30 - target CBG: 90-140 >> Try changing to 100-115 - Active insulin time: 3h  - pump alarms: 75 and 165  Please stop at the lab.  Please call and schedule an eye appt with Dr. Randon GoldsmithLyles: [facility]:  Dr. Jeni SallesGraham W. Lyles MD ?  Address: [address], [place], [place] [address]  [phone]  Please come back for a follow-up appointment in 4 months.

## 2016-02-26 NOTE — Progress Notes (Signed)
Patient ID: Mark Barr, male   DOB: Jan 21, 1986, 30 y.o.   MRN: [id number]  HPI: Mark Barr is a 30 y.o.-year-old male, returning for f/u for DM1, dx 46 (at 30 y/o), controlled, w/o complications, on insulin pump since 2000. Last visit 4 mo ago.  This summer, they lost their second baby at 24 weeks (hydrops), after wife contracted CMV virus.  Pt was on Medtronic Revel [id number] loaner, then on a Omnipod since12/09/2013, loved it, however, he developed a blistering rash at the attaching site (was using Flonase before attaching the pump >> helped >> then on Medtronic 723 >> now on Medtronic 670G.  He spends approximately 1 week in the manual mode, between changes of his sensor. He is working on reducing this., Since his sugars are more fluctuating in manual mode.  Last hemoglobin A1c:  02/2016: 6.6% -check that his work  Lab Results  Component Value Date   HGBA1C 6.5 10/01/2015   HGBA1C 7.1 (H) 06/25/2015   HGBA1C 6.8 02/19/2015  Prev. 6.7% (10/2012), previously 6.4%.  Pt checks his sugars 6-14x A day - ave 157 +/- 60 >> 134 +/-49: - 2-4:30 am: 87-140 >> 54 at 2 am x1, 208, 209 >> 39 x1, 100-120, 176 >> 103-170, 211 >> 80-191 >> 48x1 (overcorrected a high), 70-145, 185 before exercise and 134-243 after CrossFit - b'fast 5:30 am: 60-345 >> 110 >> 85-185 >> 108-129, 297 >> see above  - 2h after b'fast: 113-259 >> 100-151, 201 >> 130-258 - lunchtime 1 pm: 67-231 >> 107-139, 208 >> 68-207 >> 90-144, 160 >> 94-154 >> 100-124, 186 >> 76-152, 185, 236 - 4 pm: lower: 80-100 >> 103-126 >> 69-203 >> 107 >> 88, 216 >> 86-148 >> 176, 323 >> 210, 220 >> 107-243, 300 - before dinner 7:30-8 pm:  62-150, 250x1 >> 71, 81 >> 108-126 >> 108-148. 187 >> 78-125 >> 65-227 - bedtime 9-10 pm: 106-243, 275 >> 81-221, 253 >> 129-200, 217 >> 111-177, 204 >> 107-193, 232 >> 109-225, 307 Has lows >> 51, 54 >> 40s at night >> 48 after over correction; no hypoglycemia awareness at 70s.  Highest sugar was 297x1>>  323 (after cereal) >> 309x1 >>  315 since last visit.  Pump settings:   - Basal rates:  12 am: 0.50 >> 0.475 3 am: 0.875 >> 0.950 5:30: 0.900 >> 1.00 5 pm: 0.825 >> 0.850 7:30 pm: 0.800 >> 0.800 - Insulin to carb ratio: 1:10  Except >> 5:30 am-5 pm >> 9 - Insulin sensitivity factor: 30 - target CBG: 100-115  >> 90-140 (!) - Active insulin time: 3h  - pump alarms: 75 and 165 - occasional dual wave boluses Total daily dose from basal: 66% (29 units) Total daily dose from bolus 34% (15 units) Uses up to 60 units a day.  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   He does crossfit, weightlifting + biking. Snacks before and after exercise (~20g  Carbs).  - no CKD: Lab Results  Component Value Date   BUN 9 02/23/2015   CREATININE 1.12 02/23/2015  Latest ACR (02/2015): Microalb, Ur     0.0 - 1.9 mg/dL 5.9 (H)  Creatinine,U  242.8  MICROALB/CREAT RATIO     0.0 - 30.0 mg/g 2.4   - last set of lipids:  Lab Results  Component Value Date   CHOL 148 02/23/2015   HDL 53.60 02/23/2015   LDLCALC 81 02/23/2015   TRIG 66.0 02/23/2015   CHOLHDL 3 02/23/2015   - last eye exam was in 2015. No DR. Needs a new eye Dr. Marland Kitchen no numbness and tingling in his feet.  He also has a history of Hashimoto thyroiditis - not on Levothyroxine as TFTs have been normal.  Lab Results  Component Value Date   TSH 2.27 06/25/2015   I reviewed pt's medications, allergies, PMH, social hx, family hx, and changes were documented in the history of present illness. Otherwise, unchanged from my initial visit note. He stopped Prozac.  ROS: Constitutional: no weight gain/loss, no fatigue, no subjective  hyperthermia/hypothermia Eyes: no blurry vision, no xerophthalmia ENT: no sore throat, no nodules palpated in throat, no dysphagia/odynophagia, no hoarseness Cardiovascular: no CP/SOB/palpitations/leg swelling Respiratory: no cough/no SOB Gastrointestinal: no N/V/D/C Musculoskeletal: no muscle/joint aches Skin: no rashes Neurological: no tremors/numbness/tingling/dizziness  PE: BP 120/72   Pulse 62   Ht 5' 9.5" (1.765 m)   Wt 173 lb (78.5 kg)   SpO2 98%   BMI 25.18 kg/m  Body mass index is 25.18 kg/m. Wt Readings from Last 3 Encounters:  02/26/16 173 lb (78.5 kg)  01/14/16 171 lb (77.6 kg)  10/01/15 174 lb (78.9 kg)   Constitutional: normal weight, well-built, in NAD Eyes: PERRLA, EOMI, no exophthalmos ENT: moist mucous membranes, no thyromegaly, no cervical lymphadenopathy Cardiovascular: RRR, No MRG Respiratory: CTA B Gastrointestinal: abdomen soft, NT, ND, BS+ Musculoskeletal: no deformities, strength intact in all 4 Skin: moist, warm, no rashes  ASSESSMENT: 1. DM1, controlled, without complications except hypoglycemia - he is doing a great job taking his sugars very frequently and also wearing his Dexcom CGM 100% of the time. The alarms on the CGM are adequate (80 and 180).  2. Hashimoto thyroiditis - Euthyroid  PLAN:  1. Patient with long-standing, controlled DM1, but with previous hypoglycemia episodes >> now better on the new pump - reviewed both pump download and CGM traces with the pt - Overall control is good, however, he is still having fluctuating blood sugars especially in the manual mode. He is trying to reduce the amount of time he spends in the manual mode. He recently changed his insulin to carb ratio (decreased to 1.9 for lunch) and he saw improvement in his sugars with this change. -  he CBG target is too high, though, and I advised him to try to decrease this - patient is very knowledgeable about type 1 diabetes management, and he continues to adjust  his pump settings depending on his readings - I advised him to: Patient Instructions  Please continue: - Basal rates:  12 am: 0.475 3 am: 0.950 5:30: 1.00 5 pm: 0.850 7:30 pm: 0.800 - Insulin to carb ratio: 1:10 except: 5:30 am-5 pm >> 9 - Insulin sensitivity factor: 30 - target CBG: 90-140 >> Try changing to 100-115 - Active insulin time: 3h  - pump alarms: 75 and 165  Please stop at the lab.  Please call and schedule an eye appt with Dr. Randon Goldsmith: [facility]:  Dr. Jeni Salles MD ?  Address: [address] [place], [place], [place] [address]  [phone]  Please come back for a follow-up appointment in 4 months.  - check HbA1c today, along with the  rest of his annual type 1 diabetes labs - he is due for yearly eye exams >>  he needs a new eye doctor, and I recommended Dr. Randon GoldsmithLyles.  - Return to clinic in 4 months  2. Hashimoto's thyroiditis - normal TFTS at last check, but will recheck today   Orders Placed This Encounter  Procedures  . TSH  . T4, free  . T3, free  . Microalbumin / creatinine urine ratio  . COMPLETE METABOLIC PANEL WITH GFR  . Lipid panel  . Hemoglobin A1c   - time spent with the patient: 40 min, of which >50% was spent in reviewing his pump and CGM downloads, discussing his hypo- and hyper-glycemic episodes, reviewing previous labs and pump settings and developing a plan to avoid hyper-glycemia.   Office Visit on 02/26/2016  Component Date Value Ref Range Status  . TSH 02/26/2016 2.24  0.35 - 4.50 uIU/mL Final  . Free T4 02/26/2016 0.75  0.60 - 1.60 ng/dL Final   Comment: Specimens from patients who are undergoing biotin therapy and /or ingesting biotin supplements may contain high levels of biotin.  The higher biotin concentration in these specimens interferes with this Free T4 assay.  Specimens that contain high levels  of biotin may cause false high results for this Free T4 assay.  Please interpret results in light of the total  clinical presentation of the patient.    . T3, Free 02/26/2016 3.3  2.3 - 4.2 pg/mL Final  . Microalb, Ur 02/26/2016 <0.7  0.0 - 1.9 mg/dL Final  . Creatinine,U 95/62/130810/13/2017 74.8  mg/dL Final  . Microalb Creat Ratio 02/26/2016 0.9  0.0 - 30.0 mg/g Final  . Sodium 02/26/2016 139  135 - 146 mmol/L Final  . Potassium 02/26/2016 4.2  3.5 - 5.3 mmol/L Final  . Chloride 02/26/2016 101  98 - 110 mmol/L Final  . CO2 02/26/2016 26  20 - 31 mmol/L Final  . Glucose, Bld 02/26/2016 133* 65 - 99 mg/dL Final  . BUN 65/78/469610/13/2017 11  7 - 25 mg/dL Final  . Creat 29/52/841310/13/2017 0.96  0.60 - 1.35 mg/dL Final  . Total Bilirubin 02/26/2016 0.8  0.2 - 1.2 mg/dL Final  . Alkaline Phosphatase 02/26/2016 66  40 - 115 U/L Final  . AST 02/26/2016 20  10 - 40 U/L Final  . ALT 02/26/2016 12  9 - 46 U/L Final  . Total Protein 02/26/2016 6.9  6.1 - 8.1 g/dL Final  . Albumin 24/40/102710/13/2017 4.5  3.6 - 5.1 g/dL Final  . Calcium 25/36/644010/13/2017 9.4  8.6 - 10.3 mg/dL Final  . GFR, Est African American 02/26/2016 >89  >=60 mL/min Final  . GFR, Est Non African American 02/26/2016 >89  >=60 mL/min Final  . Cholesterol 02/26/2016 167  0 - 200 mg/dL Final  . Triglycerides 02/26/2016 56.0  0.0 - 149.0 mg/dL Final  . HDL 34/74/259510/13/2017 60.80  >39.00 mg/dL Final  . VLDL 63/87/564310/13/2017 11.2  0.0 - 40.0 mg/dL Final  . LDL Cholesterol 02/26/2016 95  0 - 99 mg/dL Final  . Total CHOL/HDL Ratio 02/26/2016 3   Final  . NonHDL 02/26/2016 106.61   Final  . Hgb A1c MFr Bld 02/26/2016 6.6* 4.6 - 6.5 % Final    Carlus Pavlovristina Pershing Skidmore, MD PhD [facility]

## 2016-03-18 DIAGNOSIS — F411 Generalized anxiety disorder: Secondary | ICD-10-CM | POA: Diagnosis not present

## 2016-03-18 MED FILL — TRINTELLIX 20 MG TABLET: 20 | 30 days supply | Qty: 30 | Fill #0

## 2016-04-20 MED FILL — SERTRALINE HCL 100 MG TAB: 100 | 30 days supply | Qty: 30 | Fill #0

## 2016-05-20 MED FILL — SERTRALINE HCL 100 MG TAB: 100 | 30 days supply | Qty: 30 | Fill #1

## 2016-05-24 DIAGNOSIS — F411 Generalized anxiety disorder: Secondary | ICD-10-CM | POA: Diagnosis not present

## 2016-06-15 DIAGNOSIS — E109 Type 1 diabetes mellitus without complications: Secondary | ICD-10-CM | POA: Diagnosis not present

## 2016-06-30 ENCOUNTER — Ambulatory Visit: Payer: 59 | Admitting: Internal Medicine

## 2016-06-30 MED FILL — SERTRALINE HCL 100 MG TAB: 100 | 30 days supply | Qty: 30 | Fill #0

## 2016-07-12 ENCOUNTER — Ambulatory Visit: Payer: 59 | Admitting: Pharmacist

## 2016-07-18 ENCOUNTER — Encounter: Payer: Self-pay | Admitting: Internal Medicine

## 2016-07-19 ENCOUNTER — Other Ambulatory Visit: Payer: Self-pay

## 2016-07-19 MED ORDER — INSULIN LISPRO 100 UNIT/ML ~~LOC~~ SOLN: SUBCUTANEOUS | 2 refills | Status: DC

## 2016-07-19 MED FILL — HumaLOG 100 UNIT/ML SOLN: 100 | 40 days supply | Qty: 30 | Fill #0

## 2016-08-10 MED FILL — SERTRALINE HCL 100 MG TAB: 100 | 30 days supply | Qty: 30 | Fill #1

## 2016-08-12 ENCOUNTER — Ambulatory Visit: Payer: 59 | Admitting: Internal Medicine

## 2016-08-30 ENCOUNTER — Encounter: Payer: Self-pay | Admitting: Sports Medicine

## 2016-08-30 ENCOUNTER — Ambulatory Visit (INDEPENDENT_AMBULATORY_CARE_PROVIDER_SITE_OTHER): Payer: 59 | Admitting: Sports Medicine

## 2016-08-30 ENCOUNTER — Ambulatory Visit (INDEPENDENT_AMBULATORY_CARE_PROVIDER_SITE_OTHER): Payer: 59

## 2016-08-30 ENCOUNTER — Other Ambulatory Visit: Payer: Self-pay | Admitting: Sports Medicine

## 2016-08-30 ENCOUNTER — Other Ambulatory Visit: Payer: Self-pay

## 2016-08-30 VITALS — BP 132/84 | HR 63 | Ht 69.0 in | Wt 178.6 lb

## 2016-08-30 DIAGNOSIS — M4712 Other spondylosis with myelopathy, cervical region: Secondary | ICD-10-CM

## 2016-08-30 DIAGNOSIS — M542 Cervicalgia: Secondary | ICD-10-CM

## 2016-08-30 DIAGNOSIS — E109 Type 1 diabetes mellitus without complications: Secondary | ICD-10-CM

## 2016-08-30 DIAGNOSIS — K219 Gastro-esophageal reflux disease without esophagitis: Secondary | ICD-10-CM | POA: Diagnosis not present

## 2016-08-30 MED ORDER — NAPROXEN-ESOMEPRAZOLE 500-20 MG PO TBEC: 1.0000 | DELAYED_RELEASE_TABLET | Freq: Two times a day (BID) | ORAL | 2 refills | Status: DC

## 2016-08-30 MED ORDER — GABAPENTIN 300 MG PO CAPS: ORAL_CAPSULE | ORAL | 1 refills | Status: DC

## 2016-08-30 MED ORDER — TRAMADOL HCL 50 MG PO TABS: 50.0000 mg | ORAL_TABLET | Freq: Four times a day (QID) | ORAL | 0 refills | Status: DC | PRN

## 2016-08-30 NOTE — Assessment & Plan Note (Signed)
Subjective Report:  Prior history of gastritis with ulcer.  Was previously on omeprazole.  Has self discontinued.  Has been using excessive ibuprofen     Assessment & Plan & Follow up Issues:  Chronic, worsening condition  -  1. Rx for Vimovo should help this.

## 2016-08-30 NOTE — Assessment & Plan Note (Signed)
Patient would like to avoid steroids if possible but he is amenable to potential injection.

## 2016-08-30 NOTE — Progress Notes (Signed)
OFFICE VISIT NOTE Mark Barr. Mark Barr [facility] at [facility] [phone]  Mark Barr - 31 y.o. male MRN [id number]  Date of birth: 1985-08-25  Visit Date: 08/30/2016  PCP: Kristian Covey, MD   Referred by: Kristian Covey, MD  Mark Barr. Jefferson LAT, ATC acting as scribe for Dr. Berline Chough.  SUBJECTIVE:   Chief Complaint  Patient presents with  . pain in neck   Pain in posterior neck.. Pain has been persistent since July 2017. Lasted for about 4 months constantly, then on/off with activity. Patient described mechanism as "yawning and stretching with an audible/physical pop" Significant worsening today while lifting heavy at [employer].  Caused sharp shooting pain down both arms.  The pain is described as "dead arm" pain  as is radiates away, sharp pain on spine and is rated as 3-7 (7 is current).  Worsened with mountain biking, lifting weights. At this moment, every motion causes pain/irritation Improves with ibuprofen Therapies tried include ice, ibuprofen, heat, stretching  Other associated symptoms include: pain radiates down arm into shoulder, biceps, and triceps. Occasional tingling down ulnar hand side. Headaches  Otherwise ROS as it pertains to the Chief Complaint is as below:  Pt denies any change in bowel or bladder habits, muscle weakness, numbness or falls associated with this pain. Denies fevers, chills, recent weight gain or weight loss.  No night sweats. No significant nighttime awakenings due to this issue. His diabetes has been well-controlled with his most recent A1c of approximately 6.  He is hesitant to take any type of steroids.    Review of Systems  Constitutional: Negative for chills, fever and weight loss.  Musculoskeletal: Positive for joint pain and neck pain.  Neurological: Positive for tingling, sensory change and headaches.  Psychiatric/Behavioral: The patient is nervous/anxious.     Otherwise per  HPI.  HISTORY & PERTINENT PRIOR DATA:  No specialty comments available. He reports that he has never smoked. He has never used smokeless tobacco.   Recent Labs  10/01/15 1330 02/26/16 1338  HGBA1C 6.5 6.6*   @  OBJECTIVE:  VS:  HT:5\' 9"  (175.3 cm)   WT:178 lb 9.6 oz (81 kg)  BMI:26.4    BP:132/84  HR:63bpm  TEMP: ( )  RESP:98 % Physical Exam  Constitutional: He appears well-developed and well-nourished. He is cooperative.  Non-toxic appearance. He appears distressed (Uncomfortable.  No respiratory distress).  HENT:  Head: Normocephalic and atraumatic.  Cardiovascular: Intact distal pulses.   Pulmonary/Chest: No accessory muscle usage. No respiratory distress.  Neurological: He is alert. He is not disoriented. He displays abnormal reflex. No sensory deficit.  Positive Hoffmann sign on the left. Hyperreflexic in bilateral upper extremities throughout with marked hyperreflexia at C7 on the left.  No clonus. Marked pain with any type of cervical motion including flexion, extension, side bending and rotation.  Pain is worse with Spurling's compression test.  Pain with brachial plexus squeeze on the left, normal on the right.  Minimal pain with arm squeeze test bilaterally.  Skin: Skin is warm, dry and intact. Capillary refill takes less than 2 seconds. No abrasion and no rash noted.  Psychiatric: He has a normal mood and affect. His speech is normal and behavior is normal. Thought content normal.    IMAGING & PROCEDURES: No results found. Findings:  X-rays reveal Marked loss of cervical lordosis with minimal degenerative change at C5-6 and C6-7.    ASSESSMENT & PLAN:  Visit Diagnoses:  1. 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 Controlled diabetes mellitus type 1 without complications (HCC)   4. Gastroesophageal reflux disease, esophagitis presence not specified    Meds:  Meds ordered this encounter  Medications  . gabapentin  (NEURONTIN) 300 MG capsule    Sig: Start with 1 tab po qhs X 1 week, then increase to 1 tab po bid X 1 week then 1 tab po tid prn    Dispense:  90 capsule    Refill:  1  . traMADol (ULTRAM) 50 MG tablet    Sig: Take 1 tablet (50 mg total) by mouth every 6 (six) hours as needed for moderate pain.    Dispense:  30 tablet    Refill:  0  . Naproxen-Esomeprazole (VIMOVO) 500-20 MG TBEC    Sig: Take 1 tablet by mouth 2 (two) times daily.    Dispense:  60 tablet    Refill:  2    Home Phone      [phone]     Orders:  Orders Placed This Encounter  Procedures  . DG Cervical Spine 2 or 3 views    Follow-up: No Follow-up on file.  Otherwise please see problem oriented charting as below.  CMA/ATC served as Neurosurgeon during this visit. History, Physical, and Plan performed by medical provider. Documentation and orders reviewed and attested to.      Gaspar Bidding, DO    [place] [facility] Physician    08/30/2016 5:39 PM

## 2016-08-30 NOTE — Assessment & Plan Note (Signed)
Positive Hoffman's on the left concerning for potential myelopathy.  Minimal degenerative change on x-ray but it is present and of concern for potential C6 or C7 radiculitis with underlying myelopathy.  MRI ordered for the next 24-48 hours.  Outpatient MRI ordered ASAP.  Tramadol, gabapentin and Vimovo prescribed for symptom control.  He is diabetic and would like to avoid any type of steroids.

## 2016-08-31 ENCOUNTER — Other Ambulatory Visit: Payer: Self-pay

## 2016-08-31 ENCOUNTER — Ambulatory Visit (HOSPITAL_COMMUNITY)
Admission: RE | Admit: 2016-08-31 | Discharge: 2016-08-31 | Disposition: A | Discharge status: Home / Self Care | Payer: 59 | Source: Ambulatory Visit | Attending: Sports Medicine | Admitting: Sports Medicine

## 2016-08-31 DIAGNOSIS — M4712 Other spondylosis with myelopathy, cervical region: Secondary | ICD-10-CM

## 2016-08-31 DIAGNOSIS — M50221 Other cervical disc displacement at C4-C5 level: Secondary | ICD-10-CM | POA: Diagnosis not present

## 2016-08-31 DIAGNOSIS — M50223 Other cervical disc displacement at C6-C7 level: Secondary | ICD-10-CM | POA: Diagnosis not present

## 2016-08-31 DIAGNOSIS — M50222 Other cervical disc displacement at C5-C6 level: Secondary | ICD-10-CM | POA: Insufficient documentation

## 2016-08-31 DIAGNOSIS — M4802 Spinal stenosis, cervical region: Secondary | ICD-10-CM | POA: Diagnosis not present

## 2016-09-01 ENCOUNTER — Ambulatory Visit
Admission: RE | Admit: 2016-09-01 | Discharge: 2016-09-01 | Disposition: A | Discharge status: Home / Self Care | Payer: 59 | Source: Ambulatory Visit | Attending: Sports Medicine | Admitting: Sports Medicine

## 2016-09-01 DIAGNOSIS — M4712 Other spondylosis with myelopathy, cervical region: Secondary | ICD-10-CM

## 2016-09-01 DIAGNOSIS — M50222 Other cervical disc displacement at C5-C6 level: Secondary | ICD-10-CM | POA: Diagnosis not present

## 2016-09-01 MED ORDER — TRIAMCINOLONE ACETONIDE 40 MG/ML IJ SUSP (RADIOLOGY)
60.0000 mg | Freq: Once | INTRAMUSCULAR | Status: AC
  Administered 2016-09-01: 60 mg via EPIDURAL

## 2016-09-01 MED ORDER — IOPAMIDOL (ISOVUE-M 300) INJECTION 61%
1.0000 mL | Freq: Once | INTRAMUSCULAR | Status: AC | PRN
  Administered 2016-09-01: 1 mL via EPIDURAL

## 2016-09-01 NOTE — Discharge Instructions (Signed)

## 2016-09-05 ENCOUNTER — Other Ambulatory Visit: Payer: 59

## 2016-09-05 MED FILL — HumaLOG 100 UNIT/ML SOLN: 100 | 40 days supply | Qty: 30 | Fill #1

## 2016-09-07 MED FILL — CONTOUR NEXT STRIPS: 30 days supply | Qty: 300 | Fill #2

## 2016-09-09 ENCOUNTER — Telehealth: Payer: Self-pay

## 2016-09-09 NOTE — Telephone Encounter (Signed)
Approval for [employer] Next Test strips received. Pharmacy notified of approval.

## 2016-09-23 MED FILL — SERTRALINE HCL 100 MG TAB: 100 | 30 days supply | Qty: 30 | Fill #0

## 2016-09-27 ENCOUNTER — Ambulatory Visit (INDEPENDENT_AMBULATORY_CARE_PROVIDER_SITE_OTHER): Payer: 59 | Admitting: Internal Medicine

## 2016-09-27 ENCOUNTER — Encounter: Payer: Self-pay | Admitting: Internal Medicine

## 2016-09-27 VITALS — BP 122/80 | HR 65 | Wt 171.0 lb

## 2016-09-27 DIAGNOSIS — E109 Type 1 diabetes mellitus without complications: Secondary | ICD-10-CM | POA: Diagnosis not present

## 2016-09-27 DIAGNOSIS — E063 Autoimmune thyroiditis: Secondary | ICD-10-CM

## 2016-09-27 LAB — T4, FREE: FREE T4: 0.78 ng/dL (ref 0.60–1.60)

## 2016-09-27 LAB — TSH: TSH: 1.61 u[IU]/mL (ref 0.35–4.50)

## 2016-09-27 NOTE — Patient Instructions (Addendum)
Please continue: - Basal rates:  12 am: 0.6 4 am: 1.25 5:30 am: 1.00 5 pm: 0.75 7:30 pm: 0.800  - Insulin to carb ratio: 1:10 except: 5:30 am-5 pm >> 9 - Insulin sensitivity factor: 30 - target CBG: 100-115 - Active insulin time: 3h  - pump alarms: 75 and 165  Please stop at the lab.  Please call and schedule an eye appt with Dr. Randon GoldsmithLyles: [facility]:  Dr. Jeni SallesGraham W. Lyles MD ?  Address: [address] [place], [place], [place] [address]  [phone]  Please come back for a follow-up appointment in 4 months.

## 2016-09-27 NOTE — Progress Notes (Addendum)
Patient ID: Mark Barr, male   DOB: 1986-03-20, 31 y.o.   MRN: [id number]  HPI: Mark Barr is a 31 y.o.-year-old male, returning for f/u for DM1, dx 88 (at 31 y/o), controlled, w/o complications, on insulin pump since 2000. Last visit 4 months ago.  He had 1 steroid inj in neck for herniated disks >> in 08/2016.  Pt was on the following pumps: - Medtronic Revel 530 G loaner - Omnipod since12/09/2013 >> loved it, however he developed a blistering a rash and the attaching site (started to use Flonase before attaching the pump, which helped) - Medtronic 723  - Medtronic 670G since 10/2015 - his pump screen cracked - he now has a replacement  At last visit, he was spending proximately 1 week per month in the manual mode, however, his sugars were more fluctuating then. He is now spending less time in the manual mode.  He had to switch to Humalog per insurance preferences. He did not see a difference compared to Mark Barr or Novolog.  Reviewed previous hemoglobin A1c levels:  Lab Results  Component Value Date   HGBA1C 6.6 (H) 02/26/2016   HGBA1C 6.5 10/01/2015   HGBA1C 7.1 (H) 06/25/2015   HGBA1C 6.8 02/19/2015   HGBA1C 6.5 08/25/2014   HGBA1C 7.2 (H) 04/28/2014   HGBA1C 6.9 (H) 12/24/2013   HGBA1C 6.1 09/16/2013   HGBA1C 6.6 (H) 05/13/2013   HGBA1C 6.9 (H) 02/11/2013   Pt checks his sugars 6x A day Has lows >> 54 after overcorrection of a high CBG Highest sugar was 315 >> >400 x1 since last visit.  CGM parameters: - Average from CGM: 138 +/- 45 - Average from manual BG checks: 159+/-61  Time in range:  - very low (40-50): 2% - low (50-70): 2% - normal range (70-180): 80% - high sugars (180-250): 13% - very high sugars (250-400): 3%  - in auto mode: 91% - in manual mode: 9%  Pump settings:   - Basal rates:  12 am: 0.50 >> 0.475 >> 0.6 4 am: 0.875 >> 0.950 >> 1.25 5:30 am: 0.900 >> 1.00 >> 1.00 5 pm: 0.825 >> 0.850 >> 0.75 7:30 pm: 0.800 >> 0.800 - Insulin  to carb ratio: 1:10  Except >> 5:30 am-5 pm >> 9 - Insulin sensitivity factor: 30 - target CBG: 100-115 - Active insulin time: 3h  - pump alarms: 75 and 165 Total daily dose from basal: 67% (30 units) Total daily dose from bolus 33% (15 units) Uses up to 60 units a day.  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   He continues to do weightlifting + running. He snacks before and after exercise (~20 g of carbs). She stopped CrossFit because of the pain in his neck.  - No history of CKD: Lab Results  Component Value Date   BUN 11 02/26/2016   CREATININE 0.96 02/26/2016   - Latest ACR (02/2015): Lab Results  Component Value Date   MICRALBCREAT 0.9 02/26/2016   MICRALBCREAT 2.4 02/23/2015   MICRALBCREAT 0.5 12/24/2013   MICRALBCREAT 0.3 02/11/2013   - last set of lipids:  Lab Results  Component Value Date   CHOL 167 02/26/2016   HDL  60.80 02/26/2016   LDLCALC 95 02/26/2016   TRIG 56.0 02/26/2016   CHOLHDL 3 02/26/2016   - last eye exam was in 2015 >> No DR, but did not schedule a new eye exam yet. - He denies numbness and tingling in his feet.  He also has a history of Hashimoto thyroiditis  - He is not on levothyroxine as his TFTs were normal:  Lab Results  Component Value Date   TSH 2.24 02/26/2016   In summer 2017, they lost their second baby at 24 weeks (hydrops), after wife contracted CMV virus.  ROS: Constitutional: no weight gain/no weight loss, no fatigue, no subjective hyperthermia, no subjective hypothermia Eyes: no blurry vision, no xerophthalmia ENT: no sore throat, no nodules palpated in throat, no dysphagia, no odynophagia, no hoarseness Cardiovascular: no  CP/no SOB/no palpitations/no leg swelling Respiratory: no cough/no SOB/no wheezing Gastrointestinal: no N/no V/no D/no C/no acid reflux Musculoskeletal: no muscle aches/+ joint aches (neck) Skin: no rashes, no hair loss Neurological: no tremors/no numbness/no tingling/no dizziness  I reviewed pt's medications, allergies, PMH, social hx, family hx, and changes were documented in the history of present illness. Otherwise, unchanged from my initial visit note.  PE: BP 122/80 (BP Location: Left Arm, Patient Position: Sitting)   Pulse 65   Wt 171 lb (77.6 kg)   SpO2 98%   BMI 25.25 kg/m  Body mass index is 25.25 kg/m. Wt Readings from Last 3 Encounters:  09/27/16 171 lb (77.6 kg)  08/30/16 178 lb 9.6 oz (81 kg)  02/26/16 173 lb (78.5 kg)   Constitutional: normal weight, in NAD Eyes: PERRLA, EOMI, no exophthalmos ENT: moist mucous membranes, no thyromegaly, no cervical lymphadenopathy Cardiovascular: RRR, No MRG Respiratory: CTA B Gastrointestinal: abdomen soft, NT, ND, BS+ Musculoskeletal: no deformities, strength intact in all 4 Skin: moist, warm, no rashes Neurological: no tremor with outstretched hands, DTR normal in all 4  ASSESSMENT: 1. DM1, controlled, without complications except hypoglycemia  2. Hashimoto thyroiditis - Euthyroid  PLAN:  1. Patient with long-standing, controlled DM1, Controlled DM1, with previously more hypoglycemic episodes, Now improved and only occasionally dropping his sugars in the 40s to 50s usually after correction of a high blood sugar. He is doing a great job checking his sugars frequently in calibrating the sensor. The only pattern identified on his CGM and pump download his a slightly lower blood sugar before dinner with a subsequent increase after dinner. He tells me that he may go for longer periods of time without eating in the afternoon during work and he tends to over correct this when he gets home.  - He continues to exercise, however, he  cannot do intense, CrossFit training anymore, due to the herniated disks in his back. His sugars were higher after he received a steroid injection at the end of last month. He may need to get the second one. He does continue to run and lift weights. Whenever he runs, if his sugars are lower than 150, he disconnects the pump, however, when the sugars are higher than 150, he changes the temporary target to 150. - he did decrease his CBG targeted at last visit, he is doing well with this. - He is very knowledgeable about type 1 diabetes management and continue to adjust the pump settings depending on the CBG patterns observed - I advised him to continue the current settings for now: Patient Instructions  Please continue: - Basal rates:  12 am: 0.6 4 am: 1.25 5:30 am: 1.00 5 pm:  0.75 7:30 pm: 0.800  - Insulin to carb ratio: 1:10 except: 5:30 am-5 pm >> 9 - Insulin sensitivity factor: 30 - target CBG: 100-115 - Active insulin time: 3h  - pump alarms: 75 and 165  Please stop at the lab.  Please call and schedule an eye appt with Dr. Randon GoldsmithLyles: [facility]:  Dr. Jeni SallesGraham W. Lyles MD ?  Address: [address], [place], [place] [address]  [phone]  Please come back for a follow-up appointment in 4 months.  - last HbA1c was 6.5% on 07/21/2016 >> will repeat at next visit  - continue checking sugars at different times of the day - check 1x a day, rotating checks - advised for yearly eye exams >> he needs one. At last visit, I recommended Dr. Randon GoldsmithLyles. He did not schedule this yet, so I gave him the information again. - advised for flu shot >> he is UTD - Return to clinic in 4 mo with sugar log   2. Hashimoto's thyroiditis - TFTs were checked in 02/2016 and they were normal. - will recheck today  - time spent with the patient: 40 min, of which >50% was spent in reviewing his pump and CGM downloads these will be scanned)(, discussing his hypo- and hyper-glycemic  episodes, reviewing previous labs and pump settings and developing a plan to avoid hypo- and hyper-glycemia.   Office Visit on 09/27/2016  Component Date Value Ref Range Status  . TSH 09/27/2016 1.61  0.35 - 4.50 uIU/mL Final  . Free T4 09/27/2016 0.78  0.60 - 1.60 ng/dL Final   Comment: Specimens from patients who are undergoing biotin therapy and /or ingesting biotin supplements may contain high levels of biotin.  The higher biotin concentration in these specimens interferes with this Free T4 assay.  Specimens that contain high levels  of biotin may cause false high results for this Free T4 assay.  Please interpret results in light of the total clinical presentation of the patient.     Thyroid labs are great!  Mark Pavlovristina Roger Fasnacht, MD PhD [facility]

## 2016-10-19 MED FILL — HumaLOG 100 UNIT/ML SOLN: 100 | 40 days supply | Qty: 30 | Fill #2

## 2016-10-19 MED FILL — CONTOUR NEXT STRIPS: 30 days supply | Qty: 300 | Fill #3

## 2016-10-20 DIAGNOSIS — F411 Generalized anxiety disorder: Secondary | ICD-10-CM | POA: Diagnosis not present

## 2016-10-21 MED FILL — SERTRALINE HCL 100 MG TAB: 100 | 30 days supply | Qty: 45 | Fill #0

## 2016-11-02 DIAGNOSIS — E109 Type 1 diabetes mellitus without complications: Secondary | ICD-10-CM | POA: Diagnosis not present

## 2016-11-18 ENCOUNTER — Other Ambulatory Visit (HOSPITAL_COMMUNITY): Payer: Self-pay | Admitting: Neurosurgery

## 2016-11-18 DIAGNOSIS — R29898 Other symptoms and signs involving the musculoskeletal system: Secondary | ICD-10-CM | POA: Diagnosis not present

## 2016-11-18 DIAGNOSIS — R51 Headache: Secondary | ICD-10-CM | POA: Diagnosis not present

## 2016-11-18 DIAGNOSIS — M5412 Radiculopathy, cervical region: Secondary | ICD-10-CM | POA: Diagnosis not present

## 2016-11-18 DIAGNOSIS — R519 Headache, unspecified: Secondary | ICD-10-CM

## 2016-11-18 DIAGNOSIS — G959 Disease of spinal cord, unspecified: Secondary | ICD-10-CM | POA: Diagnosis not present

## 2016-11-22 ENCOUNTER — Other Ambulatory Visit: Payer: Self-pay

## 2016-11-22 MED ORDER — INSULIN LISPRO 100 UNIT/ML ~~LOC~~ SOLN: SUBCUTANEOUS | 2 refills | Status: DC

## 2016-11-22 MED FILL — HumaLOG 100 UNIT/ML SOLN: 100 | 40 days supply | Qty: 30 | Fill #0

## 2016-11-22 MED FILL — CONTOUR NEXT STRIPS: 30 days supply | Qty: 300 | Fill #4

## 2016-11-24 ENCOUNTER — Ambulatory Visit (HOSPITAL_COMMUNITY): Admission: RE | Admit: 2016-11-24 | Payer: 59 | Source: Ambulatory Visit

## 2016-11-24 ENCOUNTER — Ambulatory Visit (HOSPITAL_COMMUNITY): Payer: 59

## 2016-11-24 MED FILL — SERTRALINE HCL 100 MG TAB: 100 | 30 days supply | Qty: 45 | Fill #1

## 2016-11-25 ENCOUNTER — Ambulatory Visit (HOSPITAL_COMMUNITY)
Admission: RE | Admit: 2016-11-25 | Discharge: 2016-11-25 | Disposition: A | Discharge status: Home / Self Care | Payer: 59 | Source: Ambulatory Visit | Attending: Neurosurgery | Admitting: Neurosurgery

## 2016-11-25 DIAGNOSIS — R51 Headache: Secondary | ICD-10-CM | POA: Insufficient documentation

## 2016-11-25 DIAGNOSIS — M542 Cervicalgia: Secondary | ICD-10-CM | POA: Diagnosis present

## 2016-11-25 DIAGNOSIS — M4802 Spinal stenosis, cervical region: Secondary | ICD-10-CM | POA: Insufficient documentation

## 2016-11-25 DIAGNOSIS — M5412 Radiculopathy, cervical region: Secondary | ICD-10-CM

## 2016-11-25 DIAGNOSIS — M50122 Cervical disc disorder at C5-C6 level with radiculopathy: Secondary | ICD-10-CM | POA: Insufficient documentation

## 2016-11-25 DIAGNOSIS — R519 Headache, unspecified: Secondary | ICD-10-CM

## 2016-11-25 DIAGNOSIS — M50222 Other cervical disc displacement at C5-C6 level: Secondary | ICD-10-CM | POA: Diagnosis not present

## 2016-11-25 LAB — CREATININE, SERUM
CREATININE: 1.15 mg/dL (ref 0.61–1.24)
GFR calc Af Amer: >60 mL/min (ref >60)

## 2016-11-25 MED ORDER — GADOBENATE DIMEGLUMINE 529 MG/ML IV SOLN
17.0000 mL | Freq: Once | INTRAVENOUS | Status: AC | PRN
  Administered 2016-11-25: 17 mL via INTRAVENOUS

## 2016-12-03 ENCOUNTER — Ambulatory Visit (HOSPITAL_COMMUNITY): Payer: 59

## 2016-12-09 ENCOUNTER — Other Ambulatory Visit: Payer: Self-pay | Admitting: Pharmacist

## 2016-12-09 ENCOUNTER — Encounter: Payer: Self-pay | Admitting: Pharmacist

## 2016-12-09 VITALS — BP 119/80 | HR 75 | Wt 168.4 lb

## 2016-12-09 DIAGNOSIS — E109 Type 1 diabetes mellitus without complications: Secondary | ICD-10-CM

## 2016-12-09 NOTE — Patient Outreach (Signed)
[facility]) Care Management  12/09/2016  Mark Barr Jan 16, 1986 [id number]  Subjective: Patient presents today for 3 month diabetes follow-up as part of the employer-sponsored Link to Wellness program.  Current diabetes regimen includes Humalog via Medtronic 670G system (including CGM).   Patient endorses compliance to medications and carbohydrate entering. Most recent MD follow-up was 09/30/2016.  No med changes or major health changes at this time.   Patient reports that he has been doing well with his pump, diet, and exercise. He is using automode most of the time but does express some frustration as the pump does not bring down blood glucose quickly. Occasionally he will input carbs to get the pump to give him a bolus when he is not eating.  Otherwise, always remembers to bolus. Changing sensor q7 days, changes site and cartridge q3-4 days. He states interest in new Tandem + Dexcom closed loop technology.   From pump:  Last 7 days Automode 79% of time Target 73% (70-160 mg/dL) Below target 5% Above target 12% 7-day average checks- 150 Sensor average 132 14 day avg blood glucose (finger sticks) - 146 14 day Sensor avg - 134 30 day avg blood glucose (finger sticks) - 149 30 day Sensor avg - 135  Herniated discs in neck continue to cause pain however he is not pursuing intervention at this time despite continued pain. He reports he does not plan to get another steroid injection as blood sugars ran high after first one.   Doesn't feel hypoglycemic episodes well but notices around 50-60. Symptoms are predominantly sloDiw mentation, wife will notice before he does. He demonstrates proper treatment of hypoglycemia.  Feels tired, thirsty, grumpy with hyperglycemia episodes and boluses insulin when consistently running high. Usually pump takes care of this though.   Patient reported dietary habits: Eats 3 meals/day - Breakfast:  2 eggs + bagel + coffee, sometimes  cereals >> sugars spike after this  - Lunch: sandwich, fruit (apple), veggies (carrots) and, goldfish - Dinner: chicken, rice, and veggies - Snacks: 2 snacks: almonds, fruit, or cheese crackers  Patient reported exercise habits: Crossfit 5 days/week, running 3-7 miles 2-3x/week  Patient reports hypoglycemic events. 1-2 x/week (lowest in the last 30 days = 63 per CGM)  Patient denies nocturia.  Patient denies pain/burning upon urination.  Patient denies neuropathy. Patient denies visual changes. Patient reports self foot exams. No issues at this time.   Patient reported self monitored blood glucose frequency BID with CGM at minimum  Objective:  Lab Results  Component Value Date   HGBA1C 6.6 (H) 02/26/2016   HbA1c was 6.5% on 07/21/2016  Vitals:   12/09/16 1412  BP: 119/80  Pulse: 75   Filed Weights   12/09/16 1412  Weight: 168 lb 6.4 oz (76.4 kg)     Lipid Panel     Component Value Date/Time   CHOL 167 02/26/2016 1338   TRIG 56.0 02/26/2016 1338   HDL 60.80 02/26/2016 1338   CHOLHDL 3 02/26/2016 1338   VLDL 11.2 02/26/2016 1338   LDLCALC 95 02/26/2016 1338    10 year ASCVD risk: too young to calculate  Last eye exam: "too long ago", >1 year Last dental exam: 07/2016  Assessment: Diabetes: well controlled - A1C 6.5% which is at goal of less than 7%.  - Weight is down from last visit with me.   Physical Activity- at goal, limited by neck pain - Consistently achieving goal of at least 150 minutes of moderate intensity exercise  weekly  Nutrition- adequate, limited by work schedule - Maintaining adherence to diabetic diet 90% of the time  Plan/Goals for Next Visit: - Counseled on s/sx hypoglycemia and appropriate treatment - Diet goal: Continue current diet and pump management - Exercise goal: continue current regimen - Look into Tandem pump and new G6 CGM once closed-loop technology comes out - Detailed goals and action plans are listed below  Next  appointment to see me is: 03/31/2017  Devota Pacearoline Kobee Medlen, PharmD [facility] PGY2 Pharmacy Resident [phone]  [facility] CM Care Plan Problem One     Most Recent Value  Care Plan Problem One  Dissatisfaction with current puimp  Role Documenting the Problem One  Clinical Pharmacist  Care Plan for Problem One  Active  [facility] Long Term Goal   Within the next 90 days patient will contact [employer] insurance liaison to inquire about coverage for tandem pump + dexcom sensor as evidenced by patient report  [facility] Long Term Goal Start Date  12/09/16  Interventions for Problem One Long Term Goal  Will try to obtain contact information for insurance liaison. Discussed differences in Tandem's current technology and Medtronic's current technology     [facility] CM Care Plan Problem Two     Most Recent Value  Care Plan Problem Two  Adherence to diabetic lifestyle modifications  Role Documenting the Problem Two  Clinical Pharmacist  Care Plan for Problem Two  Active  Interventions for Problem Two Long Term Goal   Congratulated on excellent control, encouraged continued behavior  [facility] Long Term Goal  Over the next 90 days patient will continue to adhere to current exercise regimen of 5 days of moderate intensity exercise weekly as evidenced by continued A1C control and pump readings  [facility] Long Term Goal Start Date  12/09/16

## 2017-01-11 ENCOUNTER — Other Ambulatory Visit: Payer: Self-pay | Admitting: Internal Medicine

## 2017-01-11 MED FILL — HumaLOG 100 UNIT/ML SOLN: 100 | 40 days supply | Qty: 30 | Fill #1

## 2017-01-11 MED FILL — SERTRALINE HCL 100 MG TAB: 100 | 30 days supply | Qty: 45 | Fill #2

## 2017-01-11 MED FILL — CONTOUR NEXT STRIPS: 30 days supply | Qty: 300 | Fill #0

## 2017-01-18 ENCOUNTER — Telehealth: Payer: Self-pay

## 2017-01-18 NOTE — Telephone Encounter (Signed)
[facility] at [phone] and spoke to RembertNatalie. [facility] and she is getting the script together for the Dexcom G6. Pam will call back if she has any questions.

## 2017-02-02 ENCOUNTER — Encounter: Payer: Self-pay | Admitting: Family Medicine

## 2017-02-03 ENCOUNTER — Ambulatory Visit: Payer: 59 | Admitting: Internal Medicine

## 2017-02-09 ENCOUNTER — Telehealth: Payer: Self-pay

## 2017-02-09 NOTE — Telephone Encounter (Signed)
Called [facility] to check on the status on his supplies. Spoke to Colma. Pam in [place] is working on this to help push it through. They have the prescription and the progress notes. Corrie Dandy stated that they need glucose logs. Fax to [phone] and Attn: Pam.

## 2017-02-13 ENCOUNTER — Telehealth: Payer: Self-pay

## 2017-02-13 MED FILL — CONTOUR NEXT STRIPS: 30 days supply | Qty: 300 | Fill #1

## 2017-02-13 MED FILL — HumaLOG 100 UNIT/ML SOLN: 100 | 40 days supply | Qty: 30 | Fill #2

## 2017-02-13 MED FILL — SERTRALINE HCL 100 MG TAB: 100 | 30 days supply | Qty: 45 | Fill #3

## 2017-02-13 NOTE — Telephone Encounter (Signed)
Sent Dexcom report of 14 days to [facility] for approval to for Dexcom G6 supplies.

## 2017-03-03 DIAGNOSIS — E109 Type 1 diabetes mellitus without complications: Secondary | ICD-10-CM | POA: Diagnosis not present

## 2017-03-17 ENCOUNTER — Other Ambulatory Visit: Payer: 59 | Admitting: Pharmacist

## 2017-03-17 ENCOUNTER — Ambulatory Visit: Payer: Self-pay | Admitting: Pharmacist

## 2017-03-17 NOTE — Patient Outreach (Addendum)
[facility]) Care Management  [place]   03/17/2017  YALE GOLLA 1986/03/09 [id number]  Subjective: Called patient today for 3 month diabetes follow-up as part of the employer-sponsored Link to Wellness program. Current diabetes regimen includes Humalog via Tandem T:Slim pump + Dexcom G6. Patient has switched from the Medtronic 670G system to this pump+CGM 2/2 frustration with how slowly CBGs were corrected on other system. Has been wearing this new pump+CGM x 1 month and loves it. Patient endorses compliance to medications and carbohydrate entering, no longer having to enter fake carbs to bring glucose down. No change in glycemic control or hypoglycemic patterms. Most recent MD follow-up was 09/30/2016. No med changes or major health changes at this time.   Rotating infusion and CGM placement sites. Changing infusion sets q3-4 days. CGM lasting 10 days, on average. No issues with injection site.    Fastings 110s, post prandials usually less than 180 however >180 once/day. Herniated discs in neck continue to cause pain however he is not pursuing intervention at this time. Still going to crossfit 4-5 times/week and running 1-2 times/week.  Doesn't feel hypoglycemic episodes well but notices around 50-60. Symptoms are predominantly slowed  mentation, wife will notice before he does.   Patient reported dietary habits: Eats 3 meals/day - Breakfast: 2 eggs + bagel + coffee, sometimes cereals >>sugars spike after this  - Lunch: sandwich, fruit (apple), veggies (carrots)  - Dinner: crockpot chicken+beans+corn+salsa - Snacks: 2 snacks: almonds, fruit, or cheese crackers, protein bar  Patient reports hypoglycemic events. 4-5 x/week <70, 0 times/week <60 Patient denies nocturia.  Patient denies neuropathy. Patient denies visual changes. Patient reports self foot exams. No issues at this time.   Objective:   HbA1c was 6.5% on 07/21/2016  Lipid Panel      Component Value Date/Time   CHOL 167 02/26/2016 1338   TRIG 56.0 02/26/2016 1338   HDL 60.80 02/26/2016 1338   CHOLHDL 3 02/26/2016 1338   VLDL 11.2 02/26/2016 1338   LDLCALC 95 02/26/2016 1338   Last eye exam: "too long ago", >1 year Last dental exam: 11/2016   Encounter Medications: Outpatient Encounter Prescriptions as of 03/17/2017  Medication Sig  . CONTOUR NEXT TEST test strip USE TO TEST BLOOD SUGAR UP TO 10 TIMES A DAY AS INSTRUCTED  . Insulin Degludec (TRESIBA FLEXTOUCH ) Inject 20 Units into the skin daily.  . insulin lispro (HUMALOG) 100 UNIT/ML injection Inject up to 75 units into pump daily as advised.  . Insulin Pen Needle (BD PEN NEEDLE NANO U/F) 32G X 4 MM MISC Use 4x a day  . sertraline (ZOLOFT) 100 MG tablet Take 150 mg by mouth daily.    No facility-administered encounter medications on file as of 03/17/2017.     Functional Status: In your present state of health, do you have any difficulty performing the following activities: 12/09/2016  Hearing? N  Vision? N  Difficulty concentrating or making decisions? N  Walking or climbing stairs? N  Dressing or bathing? N  Doing errands, shopping? N  Some recent data might be hidden    Fall/Depression Screening: Fall Risk  12/09/2016 09/02/2014  Falls in the past year? No No   PHQ 2/9 Scores 12/09/2016 09/02/2014  PHQ - 2 Score 0 0    Assessment: Diabetes: well controlled - A1C 6.5% which is at  goal of less than 7%.  - Weight is stable from last visit with me per patient report.   Physical Activity- above  goal of at least 150 minutes of moderate intensity exercise weekly - Currently achieving ~225 minutes of exercise/week  Nutrition- denies food insecurity, bolusing appropriately for meals, appropriate understanding of glycemic indices and CHO effects on CBGs  Plan/Goals for Next Visit: - Diet goal: maintain current diet - Exercise goal: maintain current regimen - Discussed transition from Link To  Wellness program to [place] [facility]. Patient knows to expect letter with detail on how to sign up.  -Link to Wellness case closed; reason = Consumer enrolled in external program  Deirdre Pippins, PharmD [facility] PGY2 Pharmacy Resident [phone]  [facility] CM Care Plan Problem One     Most Recent Value  Care Plan Problem One  Dissatisfaction with current puimp  Role Documenting the Problem One  Clinical Pharmacist  Care Plan for Problem One  Not Active  [facility] Long Term Goal   Within the next 90 days patient will contact [place] insurance liaison to inquire about coverage for tandem pump + dexcom sensor as evidenced by patient report  [facility] Long Term Goal Start Date  12/09/16  [facility] Long Term Goal Met Date  03/17/17    [facility] CM Care Plan Problem Two     Most Recent Value  Care Plan Problem Two  Adherence to diabetic lifestyle modifications  Role Documenting the Problem Two  Clinical Pharmacist  Care Plan for Problem Two  Not Active  Interventions for Problem Two Long Term Goal   Encouraged him to continue  [facility] Long Term Goal  Over the next 90 days patient will continue to adhere to current exercise regimen of 5 days of moderate intensity exercise weekly as evidenced by continued A1C control and pump readings  [facility] Long Term Goal Start Date  12/09/16  [facility] Long Term Goal Met Date  03/17/17

## 2017-03-24 ENCOUNTER — Other Ambulatory Visit: Payer: Self-pay | Admitting: Internal Medicine

## 2017-03-24 MED FILL — HumaLOG 100 UNIT/ML SOLN: 100 | 40 days supply | Qty: 30 | Fill #0

## 2017-03-30 DIAGNOSIS — E109 Type 1 diabetes mellitus without complications: Secondary | ICD-10-CM | POA: Diagnosis not present

## 2017-04-21 MED FILL — SERTRALINE HCL 100 MG TAB: 100 | 30 days supply | Qty: 45 | Fill #4

## 2017-04-25 ENCOUNTER — Ambulatory Visit: Payer: 59 | Admitting: Internal Medicine

## 2017-04-28 ENCOUNTER — Ambulatory Visit (INDEPENDENT_AMBULATORY_CARE_PROVIDER_SITE_OTHER): Payer: 59 | Admitting: Family Medicine

## 2017-04-28 ENCOUNTER — Encounter: Payer: Self-pay | Admitting: Family Medicine

## 2017-04-28 VITALS — BP 100/76 | HR 73 | Temp 98.1°F | Ht 68.5 in | Wt 175.6 lb

## 2017-04-28 DIAGNOSIS — K219 Gastro-esophageal reflux disease without esophagitis: Secondary | ICD-10-CM

## 2017-04-28 DIAGNOSIS — F329 Major depressive disorder, single episode, unspecified: Secondary | ICD-10-CM

## 2017-04-28 DIAGNOSIS — M4712 Other spondylosis with myelopathy, cervical region: Secondary | ICD-10-CM | POA: Diagnosis not present

## 2017-04-28 DIAGNOSIS — F32A Depression, unspecified: Secondary | ICD-10-CM

## 2017-04-28 DIAGNOSIS — E063 Autoimmune thyroiditis: Secondary | ICD-10-CM | POA: Diagnosis not present

## 2017-04-28 DIAGNOSIS — F419 Anxiety disorder, unspecified: Secondary | ICD-10-CM | POA: Diagnosis not present

## 2017-04-28 DIAGNOSIS — Z0000 Encounter for general adult medical examination without abnormal findings: Secondary | ICD-10-CM

## 2017-04-28 MED ORDER — SERTRALINE HCL 100 MG PO TABS: 150.0000 mg | ORAL_TABLET | Freq: Every day | ORAL | 3 refills | Status: DC

## 2017-04-28 MED FILL — HumaLOG 100 UNIT/ML SOLN: 100 | 40 days supply | Qty: 30 | Fill #1

## 2017-04-28 NOTE — Progress Notes (Signed)
Phone: [phone]  Subjective:  Patient presents today for their annual physical and to establish care- [facility]. Chief complaint-noted.   See problem oriented charting- ROS- full  review of systems was completed Review of Systems  Constitutional: Negative for chills and fever.  HENT: Negative for hearing loss and tinnitus.   Eyes: Negative for blurred vision and double vision.  Respiratory: Negative for cough and hemoptysis.   Cardiovascular: Negative for chest pain and palpitations.  Gastrointestinal: Positive for heartburn. Negative for nausea and vomiting.  Genitourinary: Negative for dysuria and urgency.  Musculoskeletal: Negative for myalgias and neck pain.  Skin: Negative for itching and rash.  Neurological: Negative for dizziness and headaches.  Endo/Heme/Allergies: Negative for polydipsia. Does not bruise/bleed easily.  Psychiatric/Behavioral: Negative for hallucinations and substance abuse.   The following were reviewed and entered/updated in epic: Past Medical History:  Diagnosis Date  . Chicken pox   . Depression   . Diabetes mellitus without complication (HCC)   . Eating disorder    Patient Active Problem List   Diagnosis Date Noted  . Diabetes type 1, controlled (HCC) 01/07/2013    Priority: High  . Hashimoto's thyroiditis 06/26/2015    Priority: Medium  . Anxiety and depression 01/07/2013    Priority: Medium  . 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    Priority: Low  . GERD (gastroesophageal reflux disease) 01/07/2013    Priority: Low  . Bulimia 01/07/2013    Priority: Low   Past Surgical History:  Procedure Laterality Date  . APPENDECTOMY    . LASIK     2013 or 2014  . WISDOM TOOTH EXTRACTION      Family History  Problem Relation Age of Onset  . Thyroid disease Mother   . Hyperlipidemia Mother   . Depression Mother   . GER disease Mother   . Hypertension Mother   . Other Mother    prediabetes  . Hyperlipidemia Father   . Alcohol abuse Maternal Grandmother   . Diabetes Maternal Grandmother   . Cancer Maternal Grandfather        colon and prostate. age 31 in 2018  . Stroke Maternal Grandfather        in 90s  . Dementia Maternal Grandfather        vascular  . Healthy Sister     Medications- reviewed and updated Current Outpatient Medications  Medication Sig Dispense Refill  . CONTOUR NEXT TEST test strip USE TO TEST BLOOD SUGAR UP TO 10 TIMES A DAY AS INSTRUCTED 300 each 11  . Insulin Degludec (TRESIBA FLEXTOUCH [place]) Inject 20 Units into the skin daily.    . insulin lispro (HUMALOG) 100 UNIT/ML injection INJECT UP TO 75 UNITS INTO PUMP DAILY AS ADVISED. 30 mL 2  . Insulin Pen Needle (BD PEN NEEDLE NANO U/F) 32G X 4 MM MISC Use 4x a day 200 each 11  . sertraline (ZOLOFT) 100 MG tablet Take 150 mg by mouth daily.   1  . VIMOVO 500-20 MG TBEC Take 1 tablet by mouth 2 (two) times daily.  1   Allergies-reviewed and updated Allergies  Allergen Reactions  . Penicillins Rash    Social History   Socioeconomic History  . Marital status: Married    Spouse name: None  . Number of children: None  . Years of education: None  . Highest education level: None  Social Needs  . Financial resource strain: None  . Food insecurity - worry: None  .  Food insecurity - inability: None  . Transportation needs - medical: None  . Transportation needs - non-medical: None  Occupational History  . None  Tobacco Use  . Smoking status: Never Smoker  . Smokeless tobacco: Never Used  Substance and Sexual Activity  . Alcohol use: No  . Drug use: No  . Sexual activity: Yes  Other Topics Concern  . None  Social History Narrative   Married. 1 living child. 1 passed early after birth.       FNP endocrinology   App undergrad- graduated [employer]. Masters at [employer].       Regular exercise: yes, biking, run, weight lifting- back to crossfit, mountain biking- slow on this compared to  previous with neck      Caffeine use: daily   Objective: BP 100/76 (BP Location: Left Arm, Patient Position: Sitting, Cuff Size: Large)   Pulse 73   Temp 98.1 F (36.7 C) (Oral)   Ht 5' 8.5" (1.74 m)   Wt 175 lb 9.6 oz (79.7 kg)   SpO2 98%   BMI 26.31 kg/m  Gen: NAD, resting comfortably HEENT: Mucous membranes are moist. Oropharynx normal Neck: no thyromegaly CV: RRR no murmurs rubs or gallops Lungs: CTAB no crackles, wheeze, rhonchi Abdomen: soft/nontender/nondistended/normal bowel sounds. No rebound or guarding.  Insulin pump in place Ext: no edema Skin: warm, dry Neuro: grossly normal, moves all extremities, PERRLA  Diabetic Foot Exam - Simple   Simple Foot Form Diabetic Foot exam was performed with the following findings:  Yes 04/28/2017  3:05 PM  Visual Inspection No deformities, no ulcerations, no other skin breakdown bilaterally:  Yes Sensation Testing Intact to touch and monofilament testing bilaterally:  Yes Pulse Check Posterior Tibialis and Dorsalis pulse intact bilaterally:  Yes Comments    Assessment/Plan:  31 y.o. male presenting for annual physical.  Health Maintenance counseling: 1. Anticipatory guidance: Patient counseled regarding regular dental exams - [facility] q[facility], eye exams = needs updated as below, wearing seatbelts.  2. Risk factor reduction:  Advised patient of need for regular exercise and diet rich and fruits and vegetables to reduce risk of heart attack and stroke. Exercise- excellent 6 days a week. Diet-rather clean with diabetes.  Wt Readings from Last 3 Encounters:  04/28/17 175 lb 9.6 oz (79.7 kg)  12/09/16 168 lb 6.4 oz (76.4 kg)  09/27/16 171 lb (77.6 kg)  3. Immunizations/screenings/ancillary studies- pneumovax 2015- need to get records Immunization History  Administered Date(s) Administered  . Influenza-Unspecified 02/12/2013, 02/13/2017   Health Maintenance Due  Topic Date Due  . FOOT EXAM - updated today  12/14/1995  . OPHTHALMOLOGY EXAM - needs updated 12/14/1995  . HIV Screening - declines 12/13/2000  . TETANUS/TDAP - done 2012 with [facility] 12/13/2004  . HEMOGLOBIN A1C - 6.5 two weeks ago at his office- will have this updated when sees [name] 08/26/2016  . URINE MICROALBUMIN - will have that done with [name] 02/25/2017   4. Prostate cancer screening-  no young family history (grandfather 1180s), start at age 31-55  5. Colon cancer screening - no family history (grandfather late 7960s), start at age 31 406. Skin cancer screening/prevention- no regular dermatology. advised regular sunscreen use. Denies worrisome, changing, or new skin lesions.  7. Testicular cancer screening- advised monthly self exams - already doing 8. STD screening- patient opts out  Status of chronic or acute concerns   Sees [name] who manages his type I diabetes- he has had excellent control- a1c  within 2 weeks was 6.5. Pneumovax 23 has been done- prevnar 13 will be due at 65.   Intermittently separates shoulder- refuses surgery for now. vimovo helps him.   Anxiety and depression Well controlled. Refilled zoloft 150mg - previously through Deatra Robinson, NP but will be expensive next year with FOCUS plan. Started around college around time wife's mom committed suicide and had some struggles with loss of son. He states he is doing well- will continue current medicines.  No SI.   Meds ordered this encounter  Medications  . sertraline (ZOLOFT) 100 MG tablet    Sig: Take 1.5 tablets (150 mg total) by mouth daily.    Dispense:  135 tablet    Refill:  3   Return precautions advised.  Tana Conch, MD

## 2017-04-28 NOTE — Assessment & Plan Note (Signed)
Well controlled. Refilled zoloft 150mg - previously through Deatra RobinsonKaren Jones, NP but will be expensive next year with FOCUS plan. Started around college around time wife's mom committed suicide and had some struggles with loss of son. He states he is doing well- will continue current medicines.

## 2017-04-28 NOTE — Patient Instructions (Addendum)
Sign release of information [facility] Ma[facility]err for immunizations only  Please get your eye exam and have them fax us a copy of this  Contact [facility] [employer]- and let us know when your last Tdap was.

## 2017-05-05 ENCOUNTER — Encounter: Payer: Self-pay | Admitting: Physical Therapy

## 2017-06-09 MED FILL — HumaLOG 100 UNIT/ML SOLN: 100 | 40 days supply | Qty: 30 | Fill #2

## 2017-06-09 MED FILL — SERTRALINE HCL 100 MG TAB: 100 | 90 days supply | Qty: 135 | Fill #0

## 2017-07-04 ENCOUNTER — Telehealth: Payer: Self-pay

## 2017-07-04 ENCOUNTER — Other Ambulatory Visit: Payer: Self-pay

## 2017-07-04 DIAGNOSIS — E109 Type 1 diabetes mellitus without complications: Secondary | ICD-10-CM

## 2017-07-04 NOTE — Telephone Encounter (Signed)
Mark Barr- can you put in a [facility] under well controlled type I diabetes? Need to copy this into a phone note so it will count from what I understand in addition to the referral.   [name] from what I understand if you send us to this by mychart and we say "approved" then you can see [name] even without a new referral being placed. Apparently you have to send this before each visit as well (from my understanding- of course I hope Im wrong on that!)   I dont think staff messages count- but the referral we put in will count as long as we have a phone note. I still think you have to go into the app and enter approval of it though.   [name]   Previous Messages    ----- Message -----  From: [name], [name], NP  Sent: 07/04/2017  9:58 AM  To: [name], MD  Subject: Refer to endocrine                [name],   Do you mind sending in a referral to [facility]. I see Dr. [name] and have been established for 5 years but with the new  insurance I have to get a referral to go each time. I need to schedule an appointment soon. Thanks   [name]      Referral placed for Endo as well

## 2017-07-12 ENCOUNTER — Telehealth: Payer: Self-pay

## 2017-07-12 NOTE — Telephone Encounter (Signed)
Called [facility] pharmacy to initiate PA for the Dexcom G6 sensors and called [phone] and spoke to [place]. Eliberto Ivoryustin stated they will send a notification within 24-72 hours.

## 2017-07-13 ENCOUNTER — Ambulatory Visit: Payer: 59 | Admitting: Internal Medicine

## 2017-07-19 ENCOUNTER — Other Ambulatory Visit: Payer: Self-pay | Admitting: Internal Medicine

## 2017-07-19 MED FILL — HumaLOG 100 UNIT/ML SOLN: 100 | 40 days supply | Qty: 30 | Fill #0

## 2017-07-28 ENCOUNTER — Telehealth: Payer: Self-pay | Admitting: Sports Medicine

## 2017-07-28 MED ORDER — AZITHROMYCIN 250 MG PO TABS: ORAL_TABLET | ORAL | 0 refills | Status: DC

## 2017-07-28 MED FILL — AZITHROMYCIN 250 MG TABLET: 250 | 5 days supply | Qty: 6 | Fill #0

## 2017-07-28 NOTE — Telephone Encounter (Signed)
Received from Pt:  PCP is out of office.  I have been dealing with a sinus infection for close to two weeks now. Usually I try to wait them out but now my face and head are hurting and I feel like its getting worse instead of better. Ive been using flonase and benadryl at night.   Zpack given Pen allergy

## 2017-08-01 NOTE — Telephone Encounter (Signed)
PA for Dexcom G6 is approved from 07/19/17 through 07/19/2018.

## 2017-08-25 MED FILL — SERTRALINE HCL 100 MG TAB: 100 | 90 days supply | Qty: 135 | Fill #1

## 2017-08-25 MED FILL — HumaLOG 100 UNIT/ML SOLN: 100 | 40 days supply | Qty: 30 | Fill #1

## 2017-09-15 ENCOUNTER — Encounter: Payer: Self-pay | Admitting: Internal Medicine

## 2017-09-15 ENCOUNTER — Ambulatory Visit: Payer: No Typology Code available for payment source | Admitting: Internal Medicine

## 2017-09-15 VITALS — BP 124/72 | HR 75 | Ht 68.5 in | Wt 174.6 lb

## 2017-09-15 DIAGNOSIS — E109 Type 1 diabetes mellitus without complications: Secondary | ICD-10-CM

## 2017-09-15 DIAGNOSIS — E063 Autoimmune thyroiditis: Secondary | ICD-10-CM | POA: Diagnosis not present

## 2017-09-15 MED ORDER — INSULIN LISPRO 100 UNIT/ML ~~LOC~~ SOLN: SUBCUTANEOUS | 5 refills | Status: DC

## 2017-09-15 NOTE — Patient Instructions (Addendum)
Please continue: - Basal rates:   12 am: 0.75 3 am : 0.9 9 am: 0.7 4:30 pm: 0.75 7:30 pm: 0.65 - Insulin to carb ratio: 1:10  - Insulin sensitivity factor: 45 - target CBG: 110 - Active insulin time: 3h   Please come back for labs fasting.  Please come back for a follow-up appointment in 6 months.

## 2017-09-15 NOTE — Progress Notes (Addendum)
Patient ID: Mark Barr, male   DOB: 10-16-1985, 32 y.o.   MRN: [id number]  HPI: Mark Barr is a 32 y.o.-year-old male, returning for f/u for DM1, dx 1 (at 32 y/o), controlled, w/o complications, on insulin pump since 2000. Last visit 1 year ago.  He used the following pumps: - Medtronic Revel 530 G loaner - Omnipod since12/09/2013 >> loved it, however he developed a blistering a rash and the attaching site (started to use Flonase before attaching the pump, which helped) - Medtronic 723  - Medtronic 670G since 10/2015 - T:slim since 03/2017 + Dexcom G6 since 01/2017  He tried NovoLog and Fiasp in the past but he did not see a difference between these 2 in the 670G pump. Now on t:slim, he feels FiAsp helps him more. However, h had to switch to Humalog now per insurance preference.   When he exercises (Cross fit, mountain biking), usually uses a temp basal rate of 0.  Reviewed previous hemoglobin A1c levels:  09/15/2017: HbA1c 6.4% Lab Results  Component Value Date   HGBA1C 6.6 (H) 02/26/2016   HGBA1C 6.5 10/01/2015   HGBA1C 7.1 (H) 06/25/2015   HGBA1C 6.8 02/19/2015   HGBA1C 6.5 08/25/2014   HGBA1C 7.2 (H) 04/28/2014   HGBA1C 6.9 (H) 12/24/2013   HGBA1C 6.1 09/16/2013   HGBA1C 6.6 (H) 05/13/2013   HGBA1C 6.9 (H) 02/11/2013   CGM parameters: - will scan reports - Average from CGM: 138 +/- 4 >> 147  Time in range:  - low (<80): 6% - normal range (80-180): 70% - high sugars (>180): 24%  Highest CBG: 324, lowest CBG: 47, after exercise - rare.  His sugars are highest if he has a pump site problem or if he boluses at or after the start of the meal.  Pump settings:   - Basal rates:   12 am: 0.75 3 am : 0.9 9 am: 0.7 4:30 pm: 0.75 7:30 pm: 0.65 - Insulin to carb ratio: 1:10  - Insulin sensitivity factor: 45 - target CBG: 110 - Active insulin time: 3h Total daily dose from basal: 37% Total daily dose from bolus 63% Uses up to 60 units a day.  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   - No  CKD: Lab Results  Component Value Date   BUN 11 02/26/2016   CREATININE 1.15 11/25/2016   -Normal ACR: Lab Results  Component Value Date   MICRALBCREAT 0.9 02/26/2016   MICRALBCREAT 2.4 02/23/2015   MICRALBCREAT 0.5 12/24/2013   MICRALBCREAT 0.3 02/11/2013   -No HL; last set of lipids:  Lab Results  Component Value Date   CHOL 167 02/26/2016   HDL 60.80 02/26/2016   LDLCALC 95 02/26/2016   TRIG 56.0 02/26/2016   CHOLHDL 3 02/26/2016   - last eye exam was in 2015: No DR. He did not schedule this yet. - no numbness and tingling in his feet.  Hashimoto thyroiditis: - Not on levothyroxine - latest TSH was normal Lab Results  Component Value Date   TSH 1.61 09/27/2016   In summer 2017, they lost their second baby at 24 weeks (hydrops), after wife contracted CMV virus.  ROS: Constitutional: no  weight gain/no weight loss, no fatigue, no subjective hyperthermia, + subjective hypothermia Eyes: no blurry vision, no xerophthalmia ENT: no sore throat, no nodules palpated in throat, no dysphagia, no odynophagia, no hoarseness Cardiovascular: no CP/no SOB/no palpitations/no leg swelling Respiratory: no cough/no SOB/no wheezing Gastrointestinal: no N/no V/no D/no C/no acid reflux Musculoskeletal: no muscle aches/no joint aches Skin: no rashes, no hair loss Neurological: no tremors/no numbness/no tingling/no dizziness  I reviewed pt's medications, allergies, PMH, social hx, family hx, and changes were documented in the history of present illness. Otherwise, unchanged from my initial visit note.  PE: BP 124/72   Pulse 75   Ht 5' 8.5" (1.74 m)    Wt 174 lb 9.6 oz (79.2 kg)   SpO2 97%   BMI 26.16 kg/m  Body mass index is 26.16 kg/m. Wt Readings from Last 3 Encounters:  09/15/17 174 lb 9.6 oz (79.2 kg)  04/28/17 175 lb 9.6 oz (79.7 kg)  12/09/16 168 lb 6.4 oz (76.4 kg)   Constitutional: normal weight, in NAD Eyes: PERRLA, EOMI, no exophthalmos ENT: moist mucous membranes, no thyromegaly, no cervical lymphadenopathy Cardiovascular: RRR, No MRG Respiratory: CTA B Gastrointestinal: abdomen soft, NT, ND, BS+ Musculoskeletal: no deformities, strength intact in all 4 Skin: moist, warm, no rashes Neurological: + mild tremor with outstretched hands (chronic), DTR normal in all 4  ASSESSMENT: 1. DM1, controlled, without complications except hypoglycemia  2. Hashimoto thyroiditis - Euthyroid  PLAN:  1. Patient with long-standing, uncontrolled, type 1 diabetes, with previous history of hypoglycemic episodes, now improved, with only occasional lows.  Low blood sugars could be in the 40s or 50s, usually after correction of a high blood sugar or after exercise.   - He is doing a good job bolusing for meals, but occasionally boluses at the start of the meal or after he started the meal.  In this case, he develops postprandial hyperglycemia.  He was doing better when he was on [name], but this is not covered by his insurance.  Now on Humalog.  On this insulin, he is aware that it is very important to bolus 10 to 15 minutes before a meal.  We discussed about the possibility of dropping his insulin to carb ratio slightly, but he feels that if he boluses ahead of the meal, sugars are well controlled afterwards. - He is giving himself a temporary basal rate of 0 units/h when he exercises, up to 2 hours per session.  After exercise, he boluses less with the following meal.  By implementing these changes, his sugars are usually stable after exercise, with only occasional drops. - And these are working well for him.   He changed his basal rate since  last visit his sugars in the morning are at or close to goal - He is very knowledgeable about type 1 diabetes management and continues to adjust the pump settings depending on the CBG patterns observed -He brings his DMV form today, which I filled and returned to him -I advised him to: Patient Instructions  Please continue: - Basal rates:   12 am: 0.75 3 am : 0.9 9 am: 0.7 4:30 pm: 0.75 7:30 pm: 0.65 - Insulin to carb ratio: 1:10  - Insulin sensitivity factor: 45 - target CBG: 110 - Active insulin time: 3h   Please come back for labs fasting.  Please come back for a follow-up appointment in 6 months.  - He just checked his A1c at his workplace and this was 6.4%, excellent - continue checking sugars  at different times of the day - check >4x a day, rotating checks - advised for yearly eye exams >> he is not UTD.  He has to figure out which I doctors are covered by his insurance and then schedule an appointment. - We will check annual labs fasting - he will return for these  Orders Placed This Encounter  Procedures  . COMPLETE METABOLIC PANEL WITH GFR  . Microalbumin / creatinine urine ratio  . Lipid panel  . TSH  . T4, free  . T3, free  . POCT glycosylated hemoglobin (Hb A1C)  - Return to clinic in 6 mo with sugar log   2. Hashimoto's thyroiditis - Reviewed latest TFTs from last visit, a year ago, and these were normal  - He has some cold intolerance, but no other possible hypothyroid symptoms - we will recheck annual labs including TFTs today  - time spent with the patient: 40 min, of which >50% was spent in reviewing his pump and CGM downloads, discussing his hypo- and hyper-glycemic episodes, reviewing previous labs and pump settings and developing a plan to avoid hypo- and hyper-glycemia.   Component     Latest Ref Rng & Units 09/25/2017  Glucose     65 - 99 mg/dL 161 (H)  BUN     7 - 25 mg/dL 10  Creatinine     0.96 - 1.35 mg/dL 0.45  GFR, Est Non African  American     > OR = 60 mL/min/1.52m2 101  GFR, Est African American     > OR = 60 mL/min/1.52m2 117  BUN/Creatinine Ratio     6 - 22 (calc) NOT APPLICABLE  Sodium     135 - 146 mmol/L 139  Potassium     3.5 - 5.3 mmol/L 4.5  Chloride     98 - 110 mmol/L 102  CO2     20 - 32 mmol/L 28  Calcium     8.6 - 10.3 mg/dL 9.1  Total Protein     6.1 - 8.1 g/dL 6.5  Albumin MSPROF     3.6 - 5.1 g/dL 4.3  Globulin     1.9 - 3.7 g/dL (calc) 2.2  AG Ratio     1.0 - 2.5 (calc) 2.0  Total Bilirubin     0.2 - 1.2 mg/dL 0.7  Alkaline phosphatase (APISO)     40 - 115 U/L 83  AST     10 - 40 U/L 17  ALT     9 - 46 U/L 8 (L)  Cholesterol     <200 mg/dL 409  HDL Cholesterol     >40 mg/dL 61  Triglycerides     <811 mg/dL 60  LDL Cholesterol (Calc)     mg/dL (calc) 82  Total CHOL/HDL Ratio     <5.0 (calc) 2.6  Non-HDL Cholesterol (Calc)     <130 mg/dL (calc) 96  Triiodothyronine,Free,Serum     2.3 - 4.2 pg/mL 2.8  T4,Free(Direct)     0.8 - 1.8 ng/dL 0.9  TSH     9.14 - 7.82 mIU/L 3.85   He did not provide a urine for ACR check.  We can check this at next visit.  Carlus Pavlov, MD PhD [facility]

## 2017-09-25 ENCOUNTER — Other Ambulatory Visit: Payer: No Typology Code available for payment source

## 2017-09-25 DIAGNOSIS — E109 Type 1 diabetes mellitus without complications: Secondary | ICD-10-CM

## 2017-09-25 DIAGNOSIS — E063 Autoimmune thyroiditis: Secondary | ICD-10-CM

## 2017-09-27 LAB — COMPLETE METABOLIC PANEL WITH GFR
AG Ratio: 2 (calc) (ref 1.0–2.5)
ALT: 8 U/L — AB (ref 9–46)
AST: 17 U/L (ref 10–40)
Albumin: 4.3 g/dL (ref 3.6–5.1)
Alkaline phosphatase (APISO): 83 U/L (ref 40–115)
BILIRUBIN TOTAL: 0.7 mg/dL (ref 0.2–1.2)
BUN: 10 mg/dL (ref 7–25)
CALCIUM: 9.1 mg/dL (ref 8.6–10.3)
CHLORIDE: 102 mmol/L (ref 98–110)
CO2: 28 mmol/L (ref 20–32)
CREATININE: 0.99 mg/dL (ref 0.60–1.35)
GFR, EST AFRICAN AMERICAN: 117 mL/min/{1.73_m2} (ref >=60)
GFR, Est Non African American: 101 mL/min/{1.73_m2} (ref >=60)
GLUCOSE: 193 mg/dL — AB (ref 65–99)
Globulin: 2.2 g/dL (calc) (ref 1.9–3.7)
Potassium: 4.5 mmol/L (ref 3.5–5.3)
Sodium: 139 mmol/L (ref 135–146)
TOTAL PROTEIN: 6.5 g/dL (ref 6.1–8.1)

## 2017-09-27 LAB — MICROALBUMIN / CREATININE URINE RATIO

## 2017-09-27 LAB — TSH: TSH: 3.85 m[IU]/L (ref 0.40–4.50)

## 2017-09-27 LAB — LIPID PANEL
Cholesterol: 157 mg/dL (ref <200)
HDL: 61 mg/dL (ref >40)
LDL Cholesterol (Calc): 82 mg/dL (calc)
NON-HDL CHOLESTEROL (CALC): 96 mg/dL (ref <130)
Total CHOL/HDL Ratio: 2.6 (calc) (ref <5.0)
Triglycerides: 60 mg/dL (ref <150)

## 2017-09-27 LAB — T4, FREE: FREE T4: 0.9 ng/dL (ref 0.8–1.8)

## 2017-09-27 LAB — T3, FREE: T3 FREE: 2.8 pg/mL (ref 2.3–4.2)

## 2017-09-28 ENCOUNTER — Encounter: Payer: Self-pay | Admitting: Family Medicine

## 2017-10-18 MED FILL — HumaLOG 100 UNIT/ML SOLN: 100 | 40 days supply | Qty: 30 | Fill #2

## 2017-11-09 LAB — HM DIABETES EYE EXAM

## 2017-11-13 ENCOUNTER — Ambulatory Visit (HOSPITAL_COMMUNITY): Payer: No Typology Code available for payment source | Admitting: Psychiatry

## 2017-11-14 ENCOUNTER — Encounter: Payer: Self-pay | Admitting: Family Medicine

## 2017-11-15 MED ORDER — ESCITALOPRAM OXALATE 20 MG PO TABS: 20.0000 mg | ORAL_TABLET | Freq: Every day | ORAL | 5 refills | Status: DC

## 2017-11-15 MED FILL — ESCITALOPRAM 20 MG TABLET: 20 | 30 days supply | Qty: 30 | Fill #0

## 2017-11-15 NOTE — Addendum Note (Signed)
Addended by: Shelva MajesticHUNTER, STEPHEN O on: 11/15/2017 12:24 PM   Modules accepted: Orders

## 2017-11-17 ENCOUNTER — Ambulatory Visit (HOSPITAL_COMMUNITY): Payer: No Typology Code available for payment source | Admitting: Psychiatry

## 2017-12-20 MED FILL — ESCITALOPRAM 20 MG TABLET: 20 | 30 days supply | Qty: 30 | Fill #1

## 2017-12-22 ENCOUNTER — Other Ambulatory Visit: Payer: Self-pay | Admitting: Sports Medicine

## 2018-01-23 MED FILL — ESCITALOPRAM 20 MG TABLET: 20 | 30 days supply | Qty: 30 | Fill #2

## 2018-01-23 MED FILL — HumaLOG 100 UNIT/ML SOLN: 100 | 40 days supply | Qty: 30 | Fill #0

## 2018-01-29 ENCOUNTER — Telehealth: Payer: Self-pay | Admitting: Internal Medicine

## 2018-01-29 NOTE — Telephone Encounter (Signed)
[facility] calling to be sure we received the CMN for his CGM system-[facility] calling # [phone]

## 2018-01-29 NOTE — Telephone Encounter (Signed)
Fax received and placed on Linda S. Desk

## 2018-03-07 MED FILL — ESCITALOPRAM 20 MG TABLET: 20 | 30 days supply | Qty: 30 | Fill #3

## 2018-03-07 MED FILL — HumaLOG 100 UNIT/ML SOLN: 100 | 40 days supply | Qty: 30 | Fill #1

## 2018-03-22 ENCOUNTER — Ambulatory Visit: Payer: No Typology Code available for payment source | Admitting: Internal Medicine

## 2018-04-16 ENCOUNTER — Ambulatory Visit (INDEPENDENT_AMBULATORY_CARE_PROVIDER_SITE_OTHER): Payer: No Typology Code available for payment source | Admitting: Internal Medicine

## 2018-04-16 ENCOUNTER — Encounter: Payer: Self-pay | Admitting: Internal Medicine

## 2018-04-16 VITALS — BP 118/70 | HR 89 | Ht 68.5 in | Wt 171.0 lb

## 2018-04-16 DIAGNOSIS — E063 Autoimmune thyroiditis: Secondary | ICD-10-CM

## 2018-04-16 DIAGNOSIS — E109 Type 1 diabetes mellitus without complications: Secondary | ICD-10-CM

## 2018-04-16 LAB — HEMOGLOBIN A1C: Hemoglobin A1C: 6.3

## 2018-04-16 NOTE — Progress Notes (Signed)
Patient ID: Mark Barr, male   DOB: [birth year], 32 y.o.   MRN: [id number]  HPI: Mark Barr is a 32 y.o.-year-old male, returning for f/u for DM1, dx 59 (at 32 y/o), controlled, w/o complications, on insulin pump since 2000. Last visit 6 months ago.  He used the following problems: - Medtronic Revel 530 G loaner - Omnipod since12/09/2013 >> loved it, however he developed a blistering a rash and the attaching site (started to use Flonase before attaching the pump, which helped) - Medtronic 723  - Medtronic 670G since 10/2015 -T slim since 03/2017+ Dexcom G6 since 01/2017. He is preparing to switch to the T slim with control IQ technology.  He tried NovoLog and Fiasp in the past but he did not see a difference between these 2 in the 670G pump. Now on t:slim, he feels FiAsp helps him more.  However, he had to switch to Humalog per insurance preference.  When he exercises (Cross fit, mountain biking) - in am, he usually uses a temporary basal rate of 0.  Reviewed previous hemoglobin A1c levels:  04/10/2018: HbA1c 6.3% 09/15/2017: HbA1c 6.4% Lab Results  Component Value Date   HGBA1C 6.6 (H) 02/26/2016   HGBA1C 6.5 10/01/2015   HGBA1C 7.1 (H) 06/25/2015   HGBA1C 6.8 02/19/2015   HGBA1C 6.5 08/25/2014   HGBA1C 7.2 (H) 04/28/2014   HGBA1C 6.9 (H) 12/24/2013   HGBA1C 6.1 09/16/2013   HGBA1C 6.6 (H) 05/13/2013   HGBA1C 6.9 (H) 02/11/2013   CGM parameters: - will scan reports - Average from CGM: 138 +/- 4 >> 147 >> 141  Time in range:  - low (<80): 6% >> 15% - normal range (80-180): 70% >> 69% - high sugars (>180): 24% >> 16%  He uses his sensor 79% of the time.  Highest CBG: 328 (site problem), lowest CBG: 47 >> 51, usually after correcting high blood sugar after exercise.    Pump settings:   - Basal rates:   12 am: 0.655 3 am : 0.970 8 am: 0.750 3 pm: 0.7000 7:30 pm: 0.600 - Insulin to carb ratio: 1:10 except 3-8 am: 1:8 - Insulin sensitivity factor: 45,  except 3 am-8 am: 35 - target CBG: 110 - Active insulin time: 3h >> 2.5h Total daily dose from basal: 37% >> 36% (13 units a day) Total daily dose from bolus 63% >> 64% (23 units a day) Uses up to 60 units a day.  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   -No CKD: Lab Results  Component Value Date   BUN 10 09/25/2017   CREATININE 0.99 09/25/2017   -Normal ACR: Lab Results  Component Value Date   MICRALBCREAT 0.9 02/26/2016   MICRALBCREAT 2.4 02/23/2015   MICRALBCREAT 0.5 12/24/2013   MICRALBCREAT 0.3 02/11/2013   -No HL; last set of lipids:  Lab Results  Component Value Date   CHOL 157 09/25/2017   HDL 61 09/25/2017   LDLCALC 82 09/25/2017   TRIG 60 09/25/2017   CHOLHDL 2.6 09/25/2017   - last eye exam was in 2015 - no numbness and tingling in his feet.  Hashimoto thyroiditis: - not on LT4 - latest TSH reviewed and this  was normal: Lab Results  Component Value Date   TSH 3.85 09/25/2017   In summer 2017, they lost their second baby at 24 weeks (hydrops), after wife contracted CMV virus.  In July 2019, his wife gave birth to his second daughter.  ROS: Constitutional: no weight gain/no weight loss, no fatigue, no subjective hyperthermia, no subjective hypothermia Eyes: no blurry vision, no xerophthalmia ENT: no sore throat, no nodules palpated in neck, no dysphagia, no odynophagia, no hoarseness Cardiovascular: no CP/no SOB/no palpitations/no leg swelling Respiratory: no cough/no SOB/no wheezing Gastrointestinal: no N/no V/no D/no C/no acid reflux Musculoskeletal: no muscle aches/no joint aches Skin: no rashes, no hair loss Neurological: no tremors/no numbness/no  tingling/no dizziness  I reviewed pt's medications, allergies, PMH, social hx, family hx, and changes were documented in the history of present illness. Otherwise, unchanged from my initial visit note.  Past Medical History:  Diagnosis Date  . Chicken pox   . Depression   . Diabetes mellitus without complication (HCC)   . Eating disorder    Past Surgical History:  Procedure Laterality Date  . APPENDECTOMY    . LASIK     2013 or 2014  . WISDOM TOOTH EXTRACTION     Social History   Socioeconomic History  . Marital status: Married    Spouse name: Not on file  . Number of children: Not on file  . Years of education: Not on file  . Highest education level: Not on file  Occupational History  . Occupation: Passenger transport managerurse Practitioner    Employer: [employer]  Social Needs  . Financial resource strain: Not on file  . Food insecurity:    Worry: Not on file    Inability: Not on file  . Transportation needs:    Medical: Not on file    Non-medical: Not on file  Tobacco Use  . Smoking status: Never Smoker  . Smokeless tobacco: Never Used  Substance and Sexual Activity  . Alcohol use: No  . Drug use: No  . Sexual activity: Yes  Lifestyle  . Physical activity:    Days per week: Not on file    Minutes per session: Not on file  . Stress: Not on file  Relationships  . Social connections:    Talks on phone: Not on file    Gets together: Not on file    Attends religious service: Not on file    Active member of club or organization: Not on file    Attends meetings of clubs or organizations: Not on file    Relationship status: Not on file  . Intimate partner violence:    Fear of current or ex partner: Not on file    Emotionally abused: Not on file    Physically abused: Not on file    Forced sexual activity: Not on file  Other Topics Concern  . Not on file  Social History Narrative   Married. 1 living child. 1 passed early after birth.       FNP endocrinology   App undergrad-  graduated [employer]. Masters at [employer].       Regular exercise: yes, biking, run, weight lifting- back to crossfit, mountain biking- slow on this compared to previous with neck      Caffeine use: daily   Current Outpatient Medications on File Prior to Visit  Medication Sig Dispense Refill  . CONTOUR NEXT TEST test strip USE TO TEST BLOOD SUGAR UP TO 10 TIMES A DAY AS INSTRUCTED 300 each 11  .  escitalopram (LEXAPRO) 20 MG tablet Take 1 tablet (20 mg total) by mouth daily. 30 tablet 5  . Insulin Degludec (TRESIBA FLEXTOUCH [place]) Inject 20 Units into the skin daily.    . insulin lispro (HUMALOG) 100 UNIT/ML injection INJECT UP TO 75 UNITS INTO PUMP DAILY AS ADVISED 30 mL 5  . Insulin Pen Needle (BD PEN NEEDLE NANO U/F) 32G X 4 MM MISC Use 4x a day 200 each 11  . VIMOVO 500-20 MG TBEC TAKE ONE TABLET BY MOUTH TWICE DAILY 180 tablet 0   No current facility-administered medications on file prior to visit.    Allergies  Allergen Reactions  . Penicillins Rash   Family History  Problem Relation Age of Onset  . Thyroid disease Mother   . Hyperlipidemia Mother   . Depression Mother   . GER disease Mother   . Hypertension Mother   . Other Mother        prediabetes  . Hyperlipidemia Father   . Alcohol abuse Maternal Grandmother   . Diabetes Maternal Grandmother   . Cancer Maternal Grandfather        colon and prostate. age 78 in 2018  . Stroke Maternal Grandfather        in 90s  . Dementia Maternal Grandfather        vascular  . Healthy Sister    PE: BP 118/70   Pulse 89   Ht 5' 8.5" (1.74 m) Comment: measured  Wt 171 lb (77.6 kg)   SpO2 97%   BMI 25.62 kg/m  Body mass index is 25.62 kg/m. Wt Readings from Last 3 Encounters:  04/16/18 171 lb (77.6 kg)  09/15/17 174 lb 9.6 oz (79.2 kg)  04/28/17 175 lb 9.6 oz (79.7 kg)   Constitutional: Normal weight, fit, in NAD Eyes: PERRLA, EOMI, no exophthalmos ENT: moist mucous membranes, no thyromegaly, no cervical  lymphadenopathy Cardiovascular: RRR, No MRG Respiratory: CTA B Gastrointestinal: abdomen soft, NT, ND, BS+ Musculoskeletal: no deformities, strength intact in all 4 Skin: moist, warm, no rashes Neurological: + Mild, chronic, tremor with outstretched hands, DTR normal in all 4  ASSESSMENT: 1. DM1, controlled, without complications except hypoglycemia  2. Hashimoto thyroiditis - Euthyroid  PLAN:  1. Patient with long-standing, fairly well-controlled type 1 diabetes, with history of hypoglycemic episodes, now improved, with only occasional lows.  We reviewed together his pump and CGM downloads for the last 2 weeks.   - He is usually exercising in the morning and due to the high intensity of the exercise his sugars are high afterwards.  He then corrects these and also eats breakfast and may develop a low afterwards.  His lowest sugar was in the 50s.  We discussed that we may need to increase his insulin sensitivity factor around this time and if the sugars are still low after correction, he may need to increase his insulin to carb ratio with breakfast.  Of note, he changed both his ISF and ICR from 3 AM to 8 AM since last visit. - He may also develop a low blood sugar in the afternoon either after lunch or before dinner.  We will reduce his basal rate around this time. - Since last visit, he decrease his active insulin time to 2.5 hours.  We discussed that this may be conducive to low blood sugars and I advised him to go back to 3 or even 4 hours if he notices more lows - We also reviewed his nighttime basal rates and he is 3  AM to 8 AM basal rate is quite high.  However, he mentioned that if he tries to decrease it, his sugars are higher in the morning.  For now, we will continue this.  I do not see any significant lows in the last few days, however, looking back in his downloads, he had some low blood sugar values around 2 AM.  Of note, this is before the high basal rate starts.  I advised him to  pay attention to the condition for which she develops these if they start happening again.  For now, we will not change his nighttime basal rates - He is very knowledgeable about type 1 diabetes management and continues to adjust the pump settings depending on the CBG patterns. - He is preparing to change to the new integrated insulin pump with CGM: T slim X2 with control IQ which will be out soon.  In this case, we will only need insulin to carb ratios, the rest of the settings will be adjusted by the closed-loop circuit -I advised him to: Patient Instructions  Please change: - Basal rates:   12 am: 0.655 3 am : 0.970 8 am: 0.750 3 pm: 0.7000 >> 0.655 7:30 pm: 0.600 - Insulin to carb ratio: 1:10 except 3-8 am: 1:8 (change to 1:9 or 1:10 if still low after exercise) - Insulin sensitivity factor: 45, except 3 am-8 am: 35 (change to 1:40-45 if you exercise that day) - target CBG: 110 - Active insulin time: 2.5h  Please come back for a follow-up appointment in 6 months.  - Recent HbA1c was excellent, is 6.3%. - continue checking sugars at different times of the day - check >4x a day, rotating checks - advised for yearly eye exams >> he is not UTD - UTD with flu shot - Return to clinic in 6 mo with sugar log   2. Hashimoto's thyroiditis -TFTs normal at last check -No signs or symptoms of hypothyroidism  - time spent with the patient: 40 min, of which >50% was spent in reviewing his pump and CGM downloads, discussing his hypo- and hyper-glycemic episodes, reviewing previous labs and pump settings and developing a plan to avoid hypo- and hyper-glycemia.   Carlus Pavlov, MD PhD [facility] Endocrinology

## 2018-04-16 NOTE — Patient Instructions (Addendum)
Please change: - Basal rates:   12 am: 0.655 3 am : 0.970 8 am: 0.750 3 pm: 0.7000 >> 0.655 7:30 pm: 0.600 - Insulin to carb ratio: 1:10 except 3-8 am: 1:8 (change to 1:9 or 1:10 if still low after exercise) - Insulin sensitivity factor: 45, except 3 am-8 am: 35 (change to 1:40-45 if you exercise that day) - target CBG: 110 - Active insulin time: 2.5h  Please come back for a follow-up appointment in 6 months.

## 2018-05-04 ENCOUNTER — Telehealth: Payer: Self-pay | Admitting: Family Medicine

## 2018-05-04 MED ORDER — OSELTAMIVIR PHOSPHATE 75 MG PO CAPS
75.0000 mg | ORAL_CAPSULE | Freq: Every day | ORAL | 0 refills | Status: DC
Start: 2018-05-04 — End: 2018-11-29

## 2018-05-04 MED FILL — OSELTAMIVIR PHOSPHATE 75 MG: 75 | 10 days supply | Qty: 10 | Fill #0

## 2018-05-04 NOTE — Telephone Encounter (Signed)
Patient's child with confirmed influenza case.  Will send in Tamiflu prophylactic dose.

## 2018-05-14 MED FILL — ESCITALOPRAM 20 MG TABLET: 20 | 30 days supply | Qty: 30 | Fill #4

## 2018-05-14 MED FILL — HumaLOG 100 UNIT/ML SOLN: 100 | 40 days supply | Qty: 30 | Fill #2

## 2018-06-18 MED FILL — HumaLOG 100 UNIT/ML SOLN: 100 | 40 days supply | Qty: 30 | Fill #3

## 2018-06-18 MED FILL — ESCITALOPRAM 20 MG TABLET: 20 | 30 days supply | Qty: 30 | Fill #5

## 2018-06-20 ENCOUNTER — Encounter: Payer: Self-pay | Admitting: Family Medicine

## 2018-06-20 ENCOUNTER — Encounter: Payer: Self-pay | Admitting: Internal Medicine

## 2018-06-22 NOTE — Telephone Encounter (Signed)
Left a message for pt to call me back so I can walk him thru what the next steps are

## 2018-07-19 ENCOUNTER — Other Ambulatory Visit: Payer: Self-pay | Admitting: Family Medicine

## 2018-07-19 MED ORDER — HYDROCORTISONE ACE-PRAMOXINE 2.5-1 % RE CREA: 1.0000 "application " | TOPICAL_CREAM | Freq: Three times a day (TID) | RECTAL | 0 refills | Status: DC

## 2018-07-19 MED FILL — HumaLOG 100 UNIT/ML SOLN: 100 | 40 days supply | Qty: 30 | Fill #4

## 2018-07-19 MED FILL — ESCITALOPRAM 20 MG TABLET: 20 | 30 days supply | Qty: 30 | Fill #0

## 2018-07-19 MED FILL — HYDROCORTISONE ACE-PRAMOXIN: 2.5-1 | 7 days supply | Qty: 30 | Fill #0

## 2018-07-19 NOTE — Progress Notes (Signed)
Hydrocortisone rectal cream sent in for patient due to hemorrhoids.  He reached out to me through skype to inform me of issues.  Has failed Preparation H

## 2018-07-24 ENCOUNTER — Ambulatory Visit: Payer: No Typology Code available for payment source | Admitting: Internal Medicine

## 2018-07-31 MED FILL — HumaLOG 100 UNIT/ML SOLN: 100 | 40 days supply | Qty: 30 | Fill #5 | Status: TO

## 2018-07-31 MED FILL — ESCITALOPRAM 20 MG TABLET: 20 | 30 days supply | Qty: 30 | Fill #1 | Status: TO

## 2018-08-01 ENCOUNTER — Other Ambulatory Visit: Payer: Self-pay | Admitting: Internal Medicine

## 2018-08-01 ENCOUNTER — Encounter: Payer: Self-pay | Admitting: Internal Medicine

## 2018-08-01 MED ORDER — INSULIN LISPRO 100 UNIT/ML CARTRIDGE: SUBCUTANEOUS | 5 refills | Status: DC

## 2018-08-01 MED ORDER — INSULIN DEGLUDEC 200 UNIT/ML ~~LOC~~ SOPN: 20.0000 [IU] | PEN_INJECTOR | Freq: Every day | SUBCUTANEOUS | 5 refills | Status: DC

## 2018-08-02 MED FILL — HumaLOG 100 UNIT/ML SOCT: 100 | 50 days supply | Qty: 15 | Fill #0

## 2018-08-04 IMAGING — MR MR CERVICAL SPINE W/O CM
4 of 5 series · 19 of 48 positions shown · non-contrast
Comparison: 08/31/2016

CLINICAL DATA: Left-sided neck pain, headache and hyper reflexia.
Assess for change.

EXAM:
MRI CERVICAL SPINE WITHOUT CONTRAST
TECHNIQUE: Multiplanar, multisequence MR imaging of the cervical spine was
performed. No intravenous contrast was administered.

[Series 2: T2 · sagittal · 3.0mm · 0.39mm/px · 6 of 13 slices shown (1 of 2)]
[im 1/13]
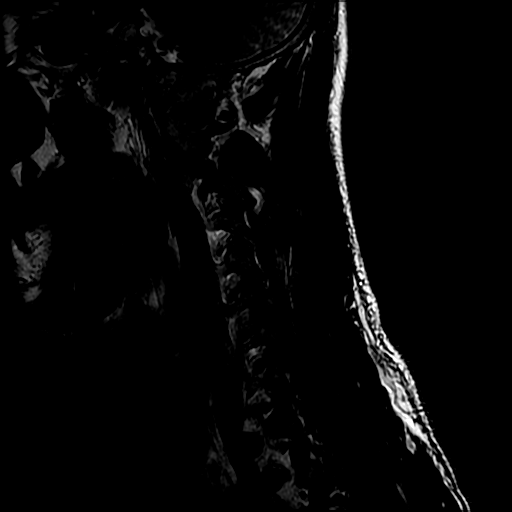
[im 3/13]
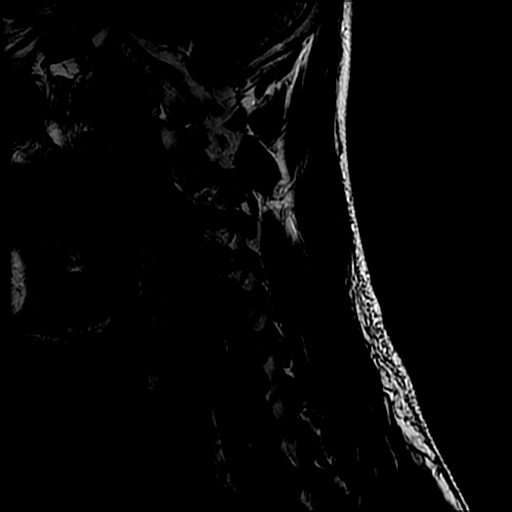
[im 5/13]
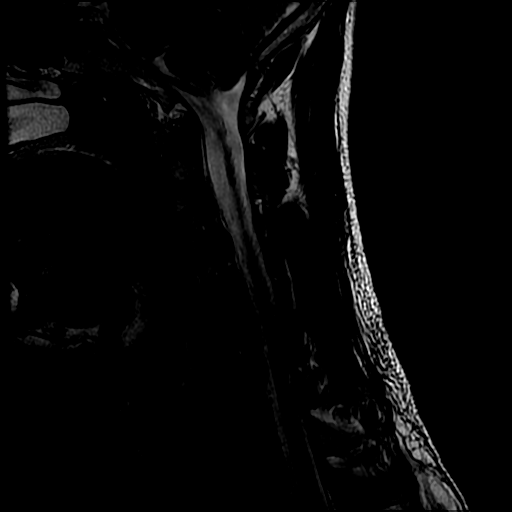
[im 8/13]
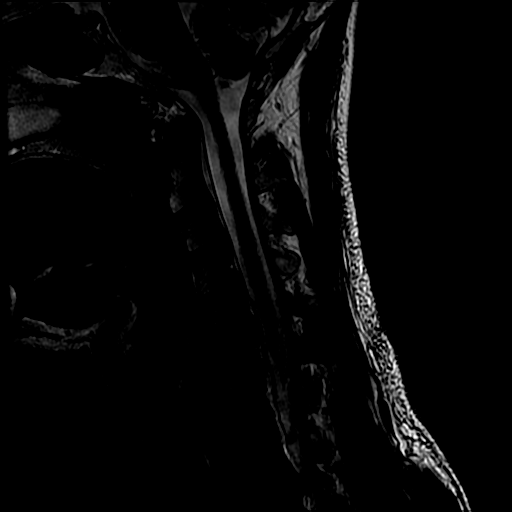
[im 10/13]
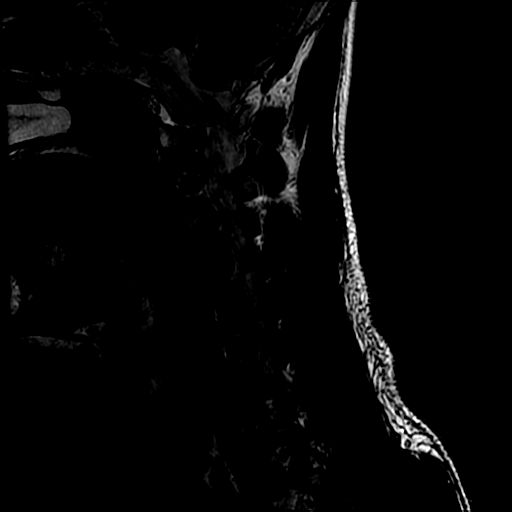
[im 13/13]
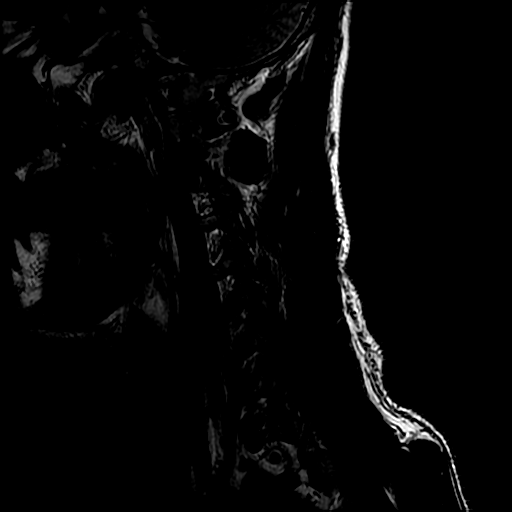

[Series 3: T1 · sagittal · 3.0mm · 0.39mm/px · 3 of 13 slices shown]
[im 3/13]
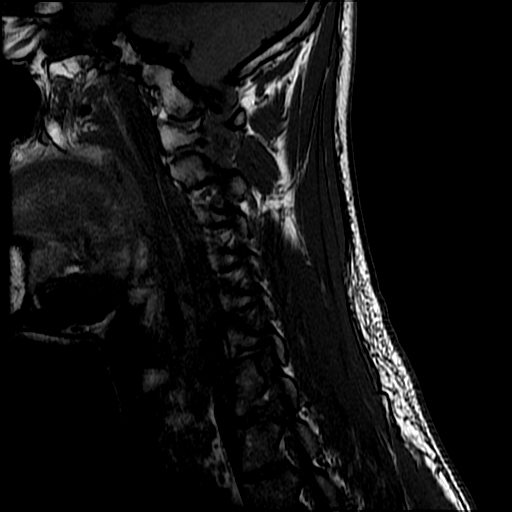
[im 8/13]
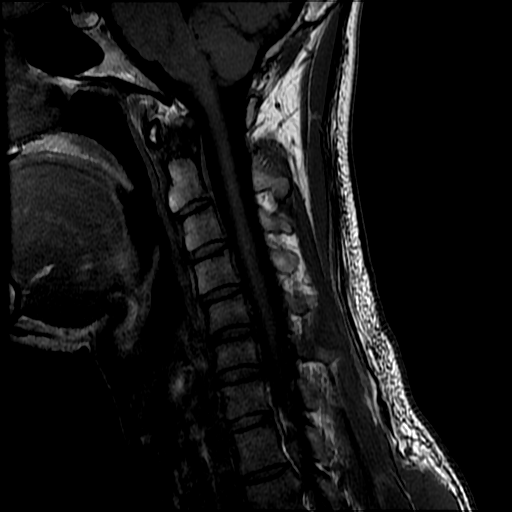
[im 13/13]
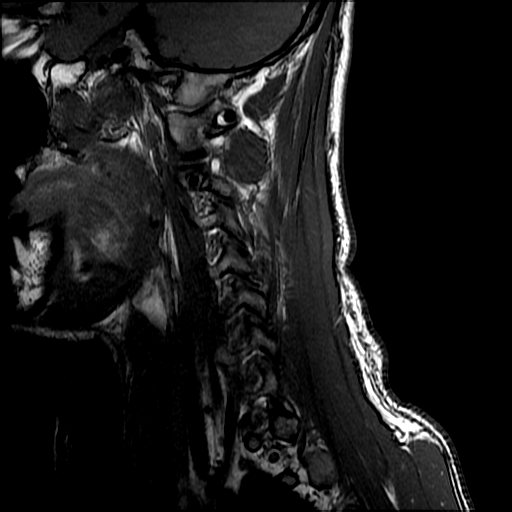

[Series 4: sag ir · sagittal · 3.0mm · 0.39mm/px · 3 of 13 slices shown]
[im 3/13]
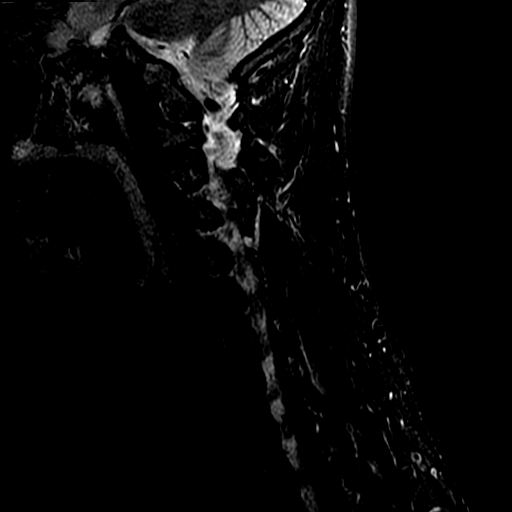
[im 8/13]
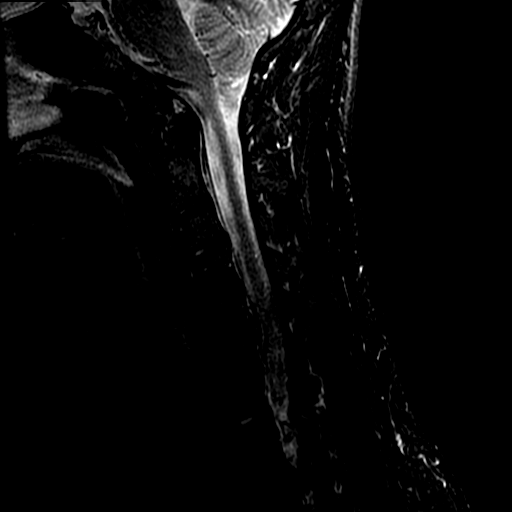
[im 13/13]
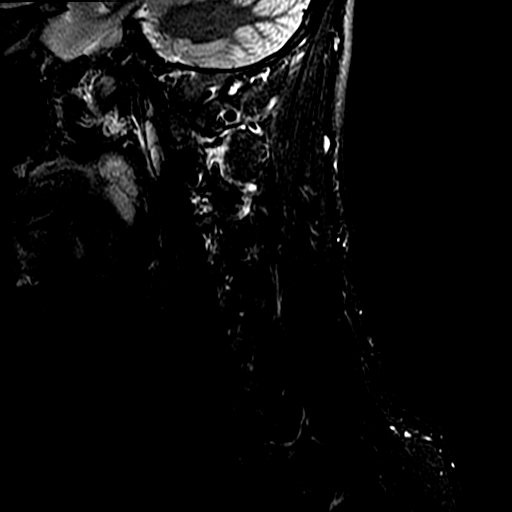

[Series 6: T2 · axial · 3.0mm · 0.39mm/px · z∈[-212,-131]mm · 7 of 30 slices shown (2 of 2)]
[im 1/30]
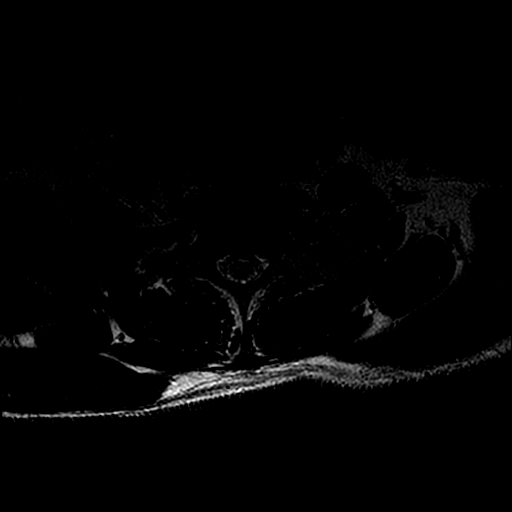
[im 5/30]
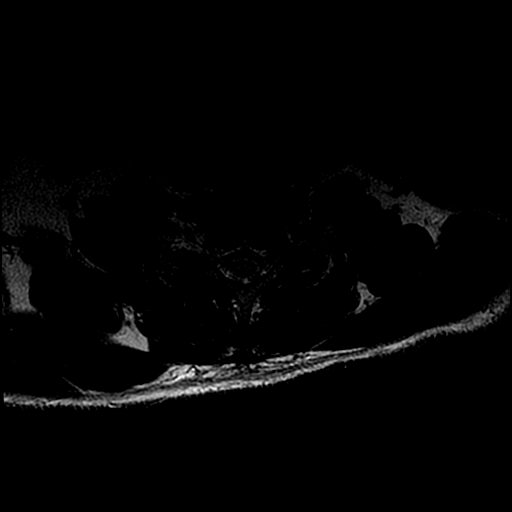
[im 9/30]
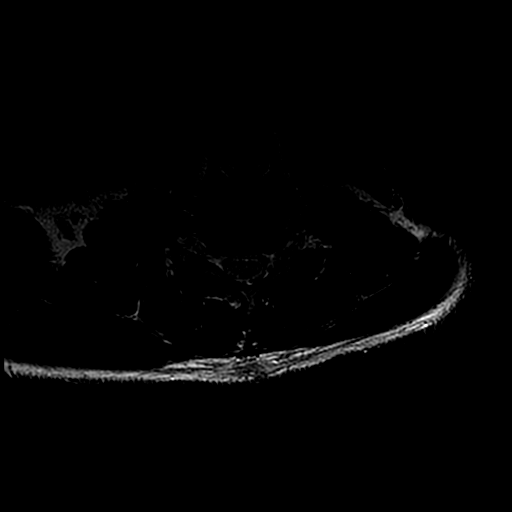
[im 13/30]
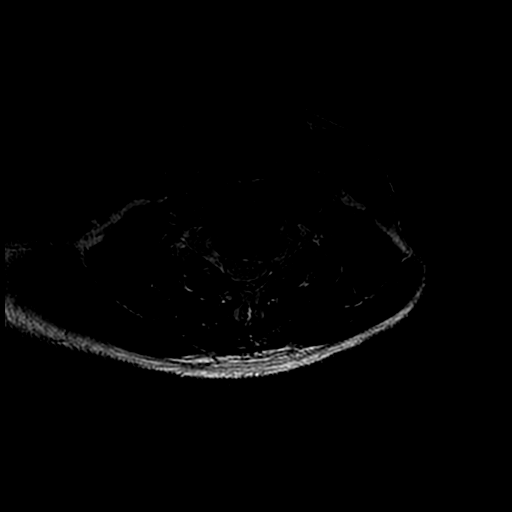
[im 15/30]
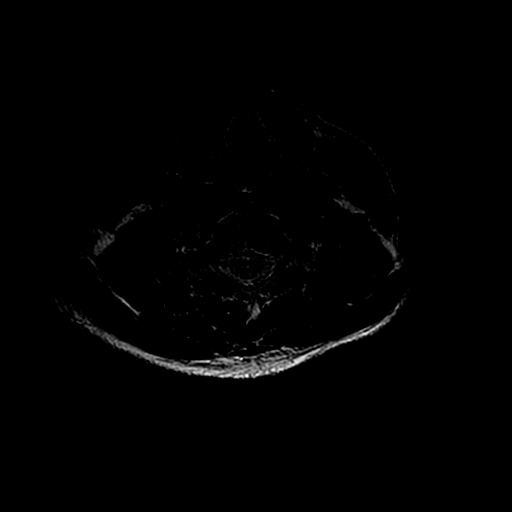
[im 17/30]
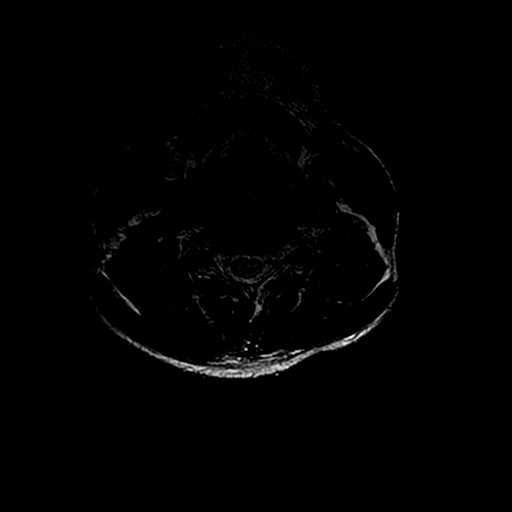
[im 25/30]
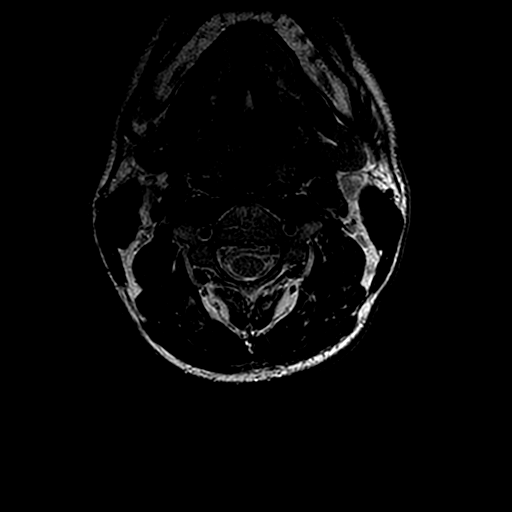

[19 of 48 positions shown; findings below may reference images not displayed]

FINDINGS: Alignment: Straightening of the normal cervical lordosis.

Vertebrae: No primary bone finding.

Cord: No cord compression or primary cord lesion.

Posterior Fossa, vertebral arteries, paraspinal tissues: Normal

Disc levels:

No abnormality at the foramen magnum, C1-2, C2-3, C3-4 or C4-5.

C5-6: No change. Shallow left posterolateral prominent disc
protrusion with associated left-sided osteophytes. Narrowing of the
ventral subarachnoid space on the left but no compression of the
cord. Mild foraminal stenosis on the left, which could affect the C6
nerve.

C6-7: Tiny right paracentral disc protrusion indents the ventral
subarachnoid space but does not affect the cord or show foraminal
extension.

C7-T1 and T1-2: Normal.
IMPRESSION: No change since the study of 08/31/2016.

C5-6 shallow left posterolateral disc protrusion with associated
left-sided osteophytes. Narrowing of the ventral subarachnoid space
on the left and mild foraminal stenosis on the left.

C6-7 right paracentral disc protrusion without apparent effect upon
the neural structures.

## 2018-08-04 IMAGING — MR MR HEAD WO/W CM
10 of 12 series · 35 of 48 positions shown · IV contrast (multihance)
Comparison: None.

CLINICAL DATA: Left-sided neck pain and headache. Hyper reflexia of
the left arm

EXAM:
MRI HEAD WITHOUT AND WITH CONTRAST
TECHNIQUE: Multiplanar, multiecho pulse sequences of the brain and surrounding
structures were obtained without and with intravenous contrast.
CONTRAST:  17mL MULTIHANCE GADOBENATE DIMEGLUMINE 529 MG/ML IV SOLN

[Series 3: DWI · axial · 3.0mm · 1.09mm/px · z∈[-112,+14]mm · 9 of 90 slices shown (1 of 4)]
[im 1/90]
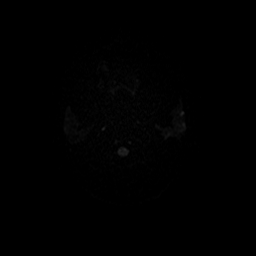
[im 12/90]
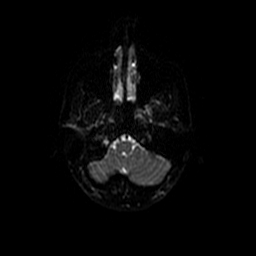
[im 23/90]
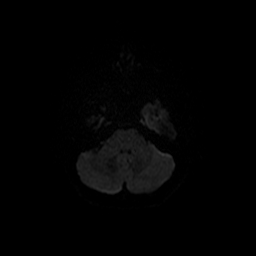
[im 34/90]
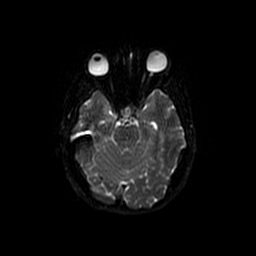
[im 45/90]
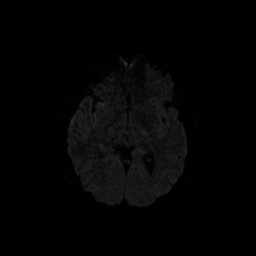
[im 56/90]
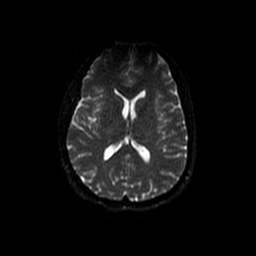
[im 67/90]
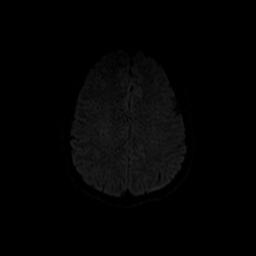
[im 78/90]
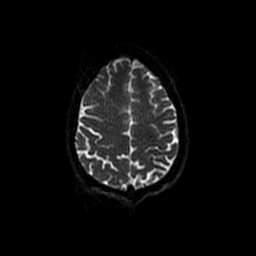
[im 90/90]
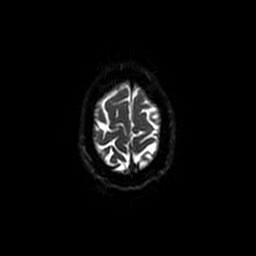

[Series 4: T1 · sagittal · 5.0mm · 0.47mm/px · 3 of 23 slices shown]
[im 1/23]
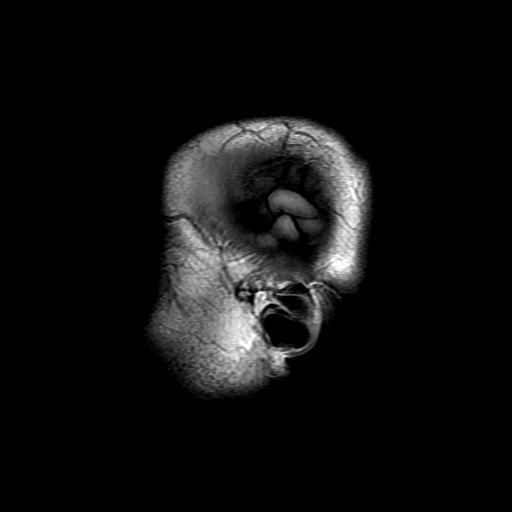
[im 12/23]
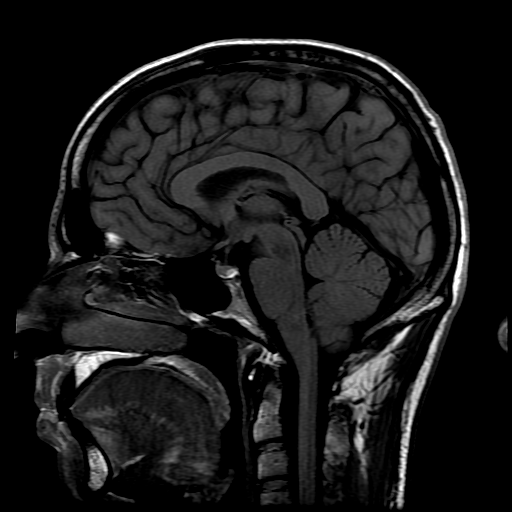
[im 23/23]
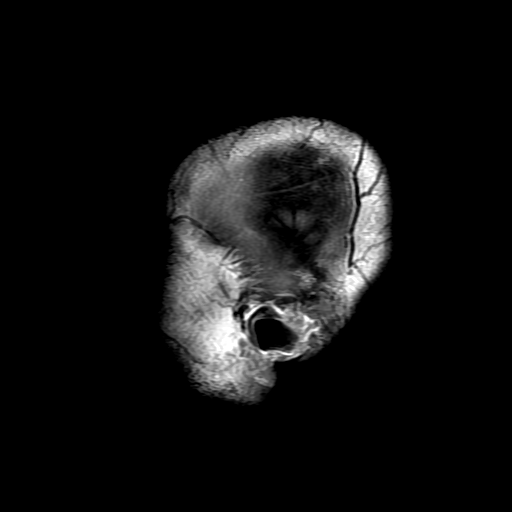

[Series 5: T2 · axial · 5.0mm · 0.43mm/px · z∈[-99,+32]mm · 2 of 24 slices shown]
[im 1/24]
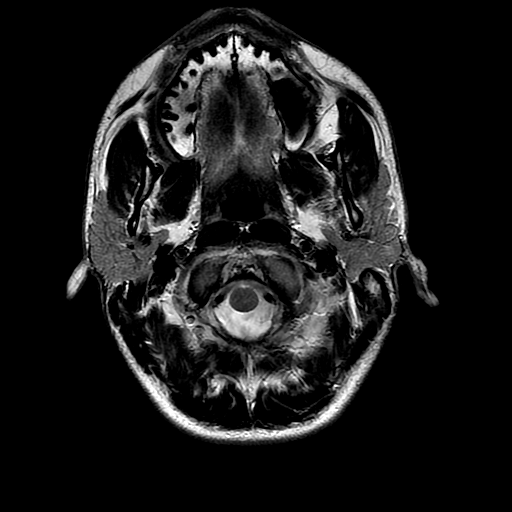
[im 24/24]
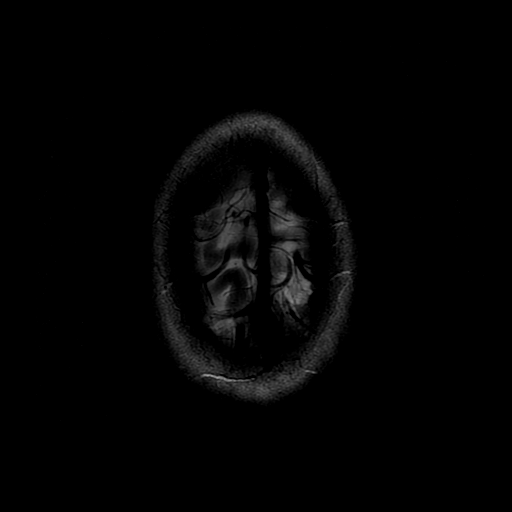

[Series 6: FLAIR · axial · 5.0mm · 0.43mm/px · z∈[-99,+32]mm · 2 of 24 slices shown]
[im 1/24]
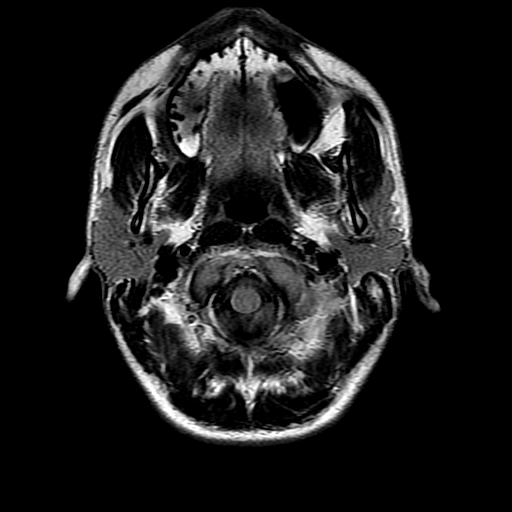
[im 24/24]
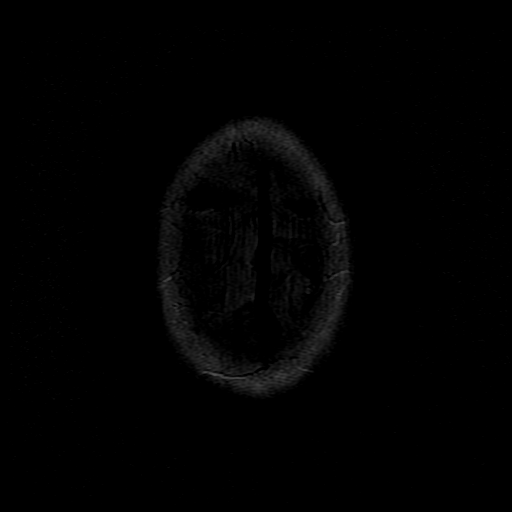

[Series 7: ax mpgr · axial · 5.0mm · 0.43mm/px · z∈[-99,+32]mm · 2 of 24 slices shown]
[im 1/24]
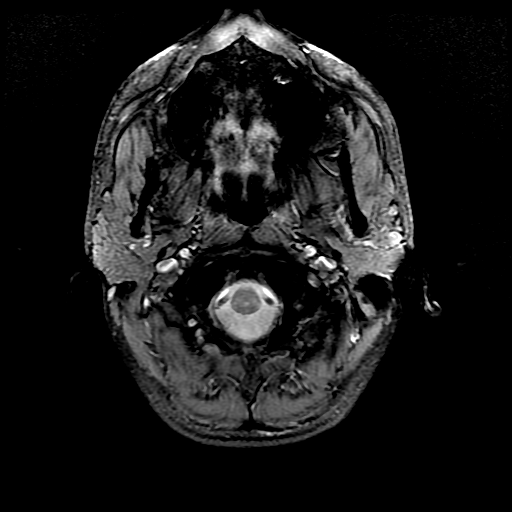
[im 24/24]
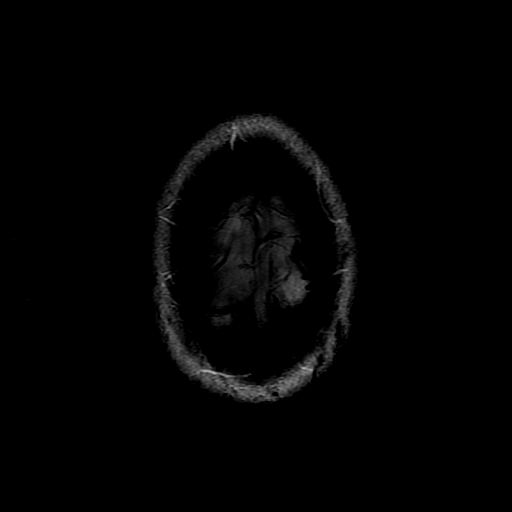

[Series 8: DWI · coronal · 5.0mm · 1.09mm/px · 6 of 66 slices shown (2 of 4)]
[im 1/66]
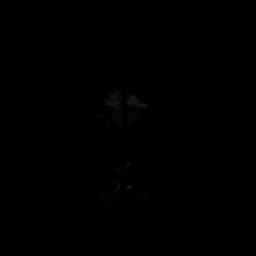
[im 14/66]
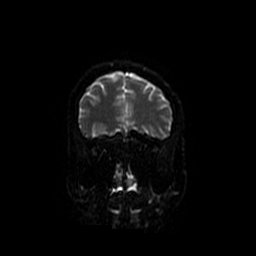
[im 27/66]
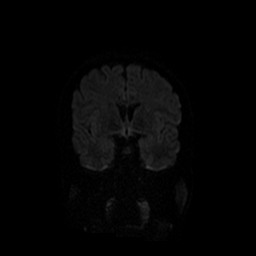
[im 40/66]
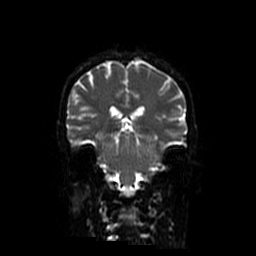
[im 53/66]
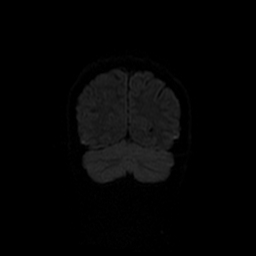
[im 66/66]
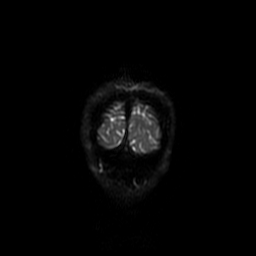

[Series 10: T2 post-contrast · coronal · 5.0mm · 0.39mm/px · 2 of 25 slices shown]
[im 1/25]
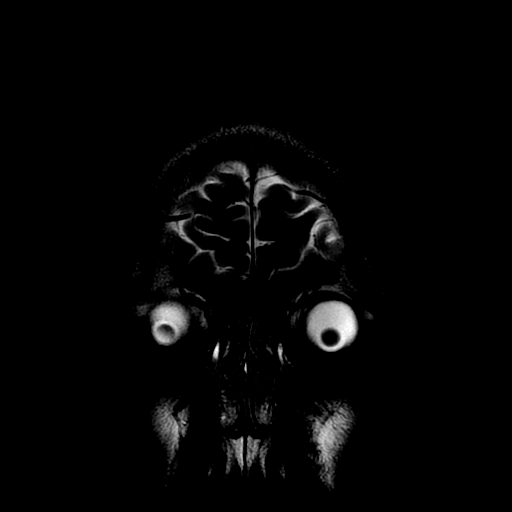
[im 25/25]
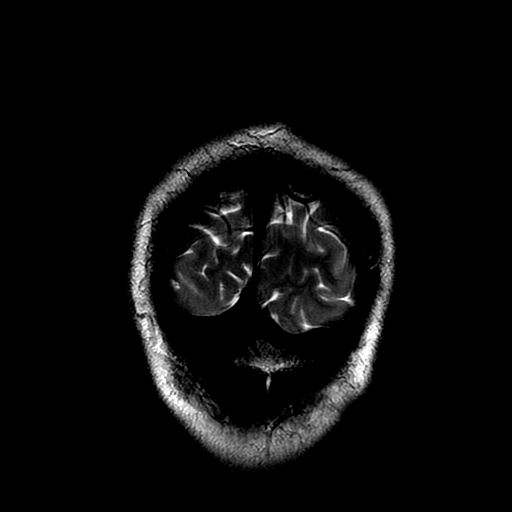

[Series 12: T1 post-contrast · coronal · 5.0mm · 0.39mm/px · 2 of 25 slices shown]
[im 1/25]
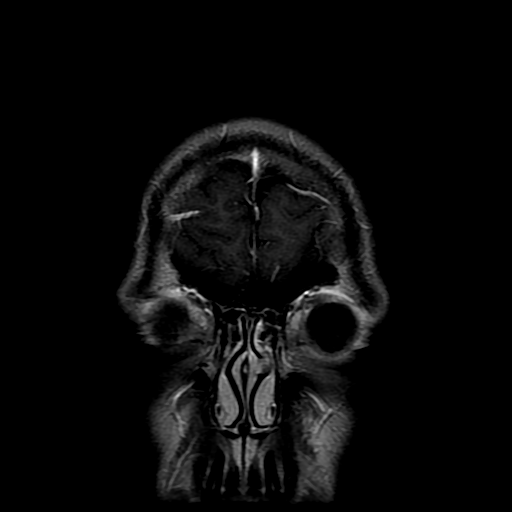
[im 25/25]
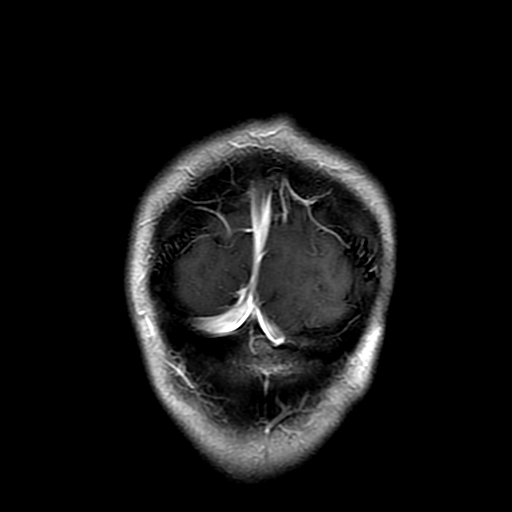

[Series 300: DWI · axial · 3.0mm · 1.09mm/px · z∈[-112,+14]mm · 4 of 45 slices shown (3 of 4)]
[im 1/45]
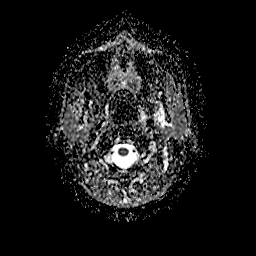
[im 15/45]
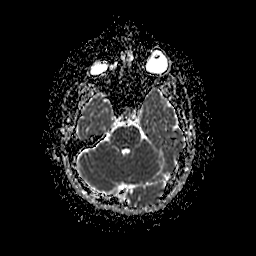
[im 30/45]
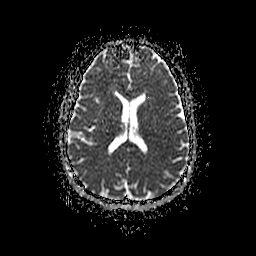
[im 45/45]
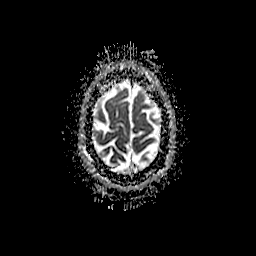

[Series 800: DWI · coronal · 5.0mm · 1.09mm/px · 3 of 33 slices shown (4 of 4)]
[im 1/33]
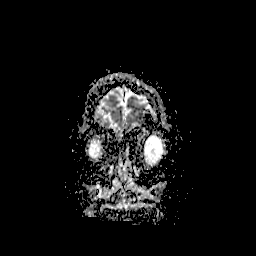
[im 17/33]
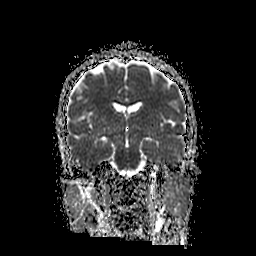
[im 33/33]
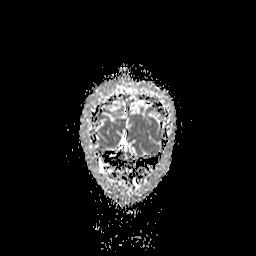

[35 of 48 positions shown; findings below may reference images not displayed]

FINDINGS: Brain: The brain has normal appearance without evidence of
malformation, atrophy, old or acute small or large vessel
infarction, hemorrhage, hydrocephalus or extra-axial collection. No
pituitary abnormality. Cerebellar tonsils extend 2 mm through the
foramen magnum, within the range of normal. After contrast
administration, no abnormal enhancement occurs.

Vascular: Major vessels at the base of the brain show flow.

Skull and upper cervical spine: Normal

Sinuses/Orbits: Clear/ normal.

Other: None significant.
IMPRESSION: Normal examination.

## 2018-08-06 ENCOUNTER — Other Ambulatory Visit: Payer: Self-pay | Admitting: Internal Medicine

## 2018-08-06 MED ORDER — INSULIN DEGLUDEC 100 UNIT/ML ~~LOC~~ SOPN: 20.0000 [IU] | PEN_INJECTOR | Freq: Every day | SUBCUTANEOUS | 5 refills | Status: AC

## 2018-08-09 ENCOUNTER — Encounter: Payer: Self-pay | Admitting: Family Medicine

## 2018-09-19 ENCOUNTER — Other Ambulatory Visit: Payer: Self-pay | Admitting: Internal Medicine

## 2018-09-19 MED FILL — ESCITALOPRAM 20 MG TABLET: 20 | 30 days supply | Qty: 30 | Fill #0

## 2018-09-19 MED FILL — HumaLOG 100 UNIT/ML SOLN: 100 | 40 days supply | Qty: 30 | Fill #0

## 2018-09-21 ENCOUNTER — Other Ambulatory Visit: Payer: Self-pay | Admitting: Pharmacist

## 2018-09-21 NOTE — Patient Outreach (Signed)
[facility]) Care Management  09/21/2018  MAYCO SOBALVARRO 1985-09-13 [id number]   Pharmacy Program Case Closure.   Beecher Mcardle, PharmD, BCACP [facility] Clinical Pharmacist [phone]

## 2018-09-26 MED FILL — DIAZEPAM 10 MG TABS: 10 | 1 days supply | Qty: 1 | Fill #0

## 2018-09-27 ENCOUNTER — Encounter: Payer: Self-pay | Admitting: Family Medicine

## 2018-09-28 ENCOUNTER — Other Ambulatory Visit: Payer: Self-pay | Admitting: Physical Therapy

## 2018-09-28 MED ORDER — NAPROXEN-ESOMEPRAZOLE 500-20 MG PO TBEC: 1.0000 | DELAYED_RELEASE_TABLET | Freq: Two times a day (BID) | ORAL | 0 refills | Status: DC

## 2018-10-02 ENCOUNTER — Telehealth: Payer: Self-pay

## 2018-10-02 NOTE — Telephone Encounter (Signed)
Mark Barr (Key: [id number])   [employer] is reviewing your PA request. You may close this dialog, return to your dashboard, and perform other tasks. To check for an update later, open this request again from your dashboard. If [employer] has not replied within 24 hours for urgent requests or within 48 hours for standard requests, please contact [employer] at [phone].

## 2018-10-02 NOTE — Telephone Encounter (Signed)
CoverMyMeds[employer] Key [id number] Vimovo (Naprozen-Esomeprazole 500-20 mg DR tabs)

## 2018-10-02 NOTE — Telephone Encounter (Signed)
Initiated via [email or web address]  Hobart Kloc (Key: [id number])   Your information has been sent to [employer].

## 2018-10-05 NOTE — Telephone Encounter (Signed)
Noted  

## 2018-10-05 NOTE — Telephone Encounter (Signed)
Patient notified of update via vm.

## 2018-10-05 NOTE — Telephone Encounter (Signed)
Do you have the update for this?  Please advise

## 2018-10-05 NOTE — Telephone Encounter (Signed)
See below, it received an unfavorable outcome

## 2018-10-05 NOTE — Telephone Encounter (Signed)
Dreshaun Seybold (Key: [id number])   This request has received a Unfavorable outcome. Please note any additional information provided by [employer] at the bottom of this request.

## 2018-10-09 ENCOUNTER — Other Ambulatory Visit: Payer: Self-pay | Admitting: Physical Therapy

## 2018-10-09 MED ORDER — IBUPROFEN-FAMOTIDINE 800-26.6 MG PO TABS: 1.0000 | ORAL_TABLET | Freq: Three times a day (TID) | ORAL | 2 refills | Status: DC | PRN

## 2018-10-26 ENCOUNTER — Telehealth: Payer: Self-pay

## 2018-10-26 NOTE — Telephone Encounter (Signed)
Duexis 800-26.6 mg tab  [email or web address]

## 2018-10-26 NOTE — Telephone Encounter (Addendum)
Mark Barr (Key: [id number])   [employer] is reviewing your PA request. You may close this dialog, return to your dashboard, and perform other tasks. To check for an update later, open this request again from your dashboard. If [employer] has not replied within 24 hours for urgent requests or within 48 hours for standard requests, please contact [employer] at [phone].

## 2018-10-30 NOTE — Telephone Encounter (Signed)
Jeremiyah Chavous (Key: [id number])  This request has received a Unfavorable outcome.  Please note any additional information provided by [employer] at the bottom of this request.  RX sent to [employer] assistance program.

## 2018-10-31 MED FILL — ESCITALOPRAM 20 MG TABLET: 20 | 30 days supply | Qty: 30 | Fill #0

## 2018-10-31 MED FILL — HumaLOG 100 UNIT/ML SOLN: 100 | 40 days supply | Qty: 30 | Fill #0

## 2018-11-23 ENCOUNTER — Ambulatory Visit (INDEPENDENT_AMBULATORY_CARE_PROVIDER_SITE_OTHER): Payer: No Typology Code available for payment source | Admitting: Internal Medicine

## 2018-11-23 ENCOUNTER — Encounter: Payer: Self-pay | Admitting: Family Medicine

## 2018-11-23 ENCOUNTER — Encounter

## 2018-11-23 ENCOUNTER — Encounter: Payer: Self-pay | Admitting: Internal Medicine

## 2018-11-23 ENCOUNTER — Other Ambulatory Visit: Payer: Self-pay

## 2018-11-23 VITALS — BP 110/70 | HR 62 | Ht 68.5 in | Wt 172.0 lb

## 2018-11-23 DIAGNOSIS — E063 Autoimmune thyroiditis: Secondary | ICD-10-CM

## 2018-11-23 DIAGNOSIS — E109 Type 1 diabetes mellitus without complications: Secondary | ICD-10-CM

## 2018-11-23 LAB — POCT GLYCOSYLATED HEMOGLOBIN (HGB A1C): Hemoglobin A1C: 6.4 % — AB (ref 4.0–5.6)

## 2018-11-23 NOTE — Progress Notes (Signed)
Patient ID: Mark Barr, male   DOB: 10-07-1985, 33 y.o.   MRN: [id number]  HPI: DOCTOR SHEAHAN is a 33 y.o.-year-old male, returning for f/u for DM1, dx 51 (at 33 y/o), controlled, w/o complications, on insulin pump since 2000. Last visit 6 months ago.  He has the following problems in the past: - Medtronic Revel [place] since12/09/2013 >> loved it, however he developed a blistering a rash and the attaching site (started to use Flonase before attaching the pump, which helped) - Medtronic 723  - Medtronic 670G since 10/2015 -T slim since 03/2017+ Dexcom G6 since 01/2017.  He is now on the T:slim X2 with control IQ technology.  He likes it.  He initially tried NovoLog and Fiasp in the pump, but did not see a difference between the 2 in the 670 G pump.  Now on T slim, he feels he asked helps him more.  However, we could not continue with this and we had to switch to Humalog per insurance preference.  He exercises consistently (cross fit, mountain biking)-in a.m.  Reviewed HbA1c levels:  Lab Results  Component Value Date   HGBA1C 6.3 04/16/2018   HGBA1C 6.6 (H) 02/26/2016   HGBA1C 6.5 10/01/2015   HGBA1C 7.1 (H) 06/25/2015   HGBA1C 6.8 02/19/2015   HGBA1C 6.5 08/25/2014   HGBA1C 7.2 (H) 04/28/2014   HGBA1C 6.9 (H) 12/24/2013   HGBA1C 6.1 09/16/2013   HGBA1C 6.6 (H) 05/13/2013   HGBA1C 6.9 (H) 02/11/2013  04/10/2018: HbA1c 6.3% 09/15/2017: HbA1c 6.4%  CGM parameters: - will scan reports - Average from CGM: 138 +/- 4 >> 147 >> 141 >> 143  Time in range:  - low (<80): 6% >> 15% >>  10% - normal range (80-180): 70% >> 69% >> 70% - high sugars (>180): 24% >> 16% >> 10%  He uses his sensor 96% of the time.  He is usually using the sleep mode throughout the day and night.  Pump settings:   - Basal rates:   12 am: 0.500, ISF 1:45, ICR 1:10 3 am : 0.800, ISF 1:40, ICR 1:10 7 am: 0.700, ISF 1:40, ICR 1:9 10 am: 0.750, ISF 1:40, ICR 1:9 2 pm: 0.765, ISF  1:45, ICR 1:9 5:30 pm: 0.700, ISF 1:45, ICR 1:10 - target CBG: 110 - Active insulin time: 3h >> 2.5h Total daily dose from basal: 37% >> 36% (13 units a day) >> 19 units a day Total daily dose from bolus 63% >> 64% (23 units a day) >> 24 units a day Uses up to 60 units a day. Highest CBG: 328 (site problem) >> 300, lowest CBG: 47 >> 51 >> 50s.  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   -No CKD: Lab Results  Component Value Date   BUN 10 09/25/2017   CREATININE 0.99 09/25/2017   -Normal ACR: Lab Results  Component Value Date   MICRALBCREAT 0.9 02/26/2016   MICRALBCREAT 2.4 02/23/2015   MICRALBCREAT 0.5 12/24/2013   MICRALBCREAT 0.3 02/11/2013   -No HL; last set of lipids:  Lab Results  Component Value Date   CHOL 157 09/25/2017   HDL 61 09/25/2017   LDLCALC 82  09/25/2017   TRIG 60 09/25/2017   CHOLHDL 2.6 09/25/2017   - last eye exam was in 11/2017: ?microaneurysms -we will need to get the actual report - no numbness and tingling in his feet.  Hashimoto thyroiditis: -Not on levothyroxine  Latest TSH normal: Lab Results  Component Value Date   TSH 3.85 09/25/2017   In summer 2017, they lost their second baby at 24 weeks (hydrops), after wife contracted CMV virus.  In July 2019, his wife gave birth to his second daughter.  ROS: Constitutional: no weight gain/no weight loss, no fatigue, no subjective hyperthermia, no subjective hypothermia Eyes: no blurry vision, no xerophthalmia ENT: no sore throat, no nodules palpated in neck, no dysphagia, no odynophagia, no hoarseness Cardiovascular: no CP/no SOB/no palpitations/no leg swelling Respiratory: no cough/no SOB/no  wheezing Gastrointestinal: no N/no V/no D/no C/no acid reflux Musculoskeletal: no muscle aches/no joint aches Skin: no rashes, no hair loss Neurological: no tremors/no numbness/no tingling/no dizziness  I reviewed pt's medications, allergies, PMH, social hx, family hx, and changes were documented in the history of present illness. Otherwise, unchanged from my initial visit note.  Past Medical History:  Diagnosis Date  . Chicken pox   . Depression   . Diabetes mellitus without complication (HCC)   . Eating disorder    Past Surgical History:  Procedure Laterality Date  . APPENDECTOMY    . LASIK     2013 or 2014  . WISDOM TOOTH EXTRACTION     Social History   Socioeconomic History  . Marital status: Married    Spouse name: Not on file  . Number of children: Not on file  . Years of education: Not on file  . Highest education level: Not on file  Occupational History  . Occupation: Passenger transport managerurse Practitioner    Employer: [employer]  Social Needs  . Financial resource strain: Not on file  . Food insecurity    Worry: Not on file    Inability: Not on file  . Transportation needs    Medical: Not on file    Non-medical: Not on file  Tobacco Use  . Smoking status: Never Smoker  . Smokeless tobacco: Never Used  Substance and Sexual Activity  . Alcohol use: No  . Drug use: No  . Sexual activity: Yes  Lifestyle  . Physical activity    Days per week: Not on file    Minutes per session: Not on file  . Stress: Not on file  Relationships  . Social Musicianconnections    Talks on phone: Not on file    Gets together: Not on file    Attends religious service: Not on file    Active member of club or organization: Not on file    Attends meetings of clubs or organizations: Not on file    Relationship status: Not on file  . Intimate partner violence    Fear of current or ex partner: Not on file    Emotionally abused: Not on file    Physically abused: Not on file    Forced sexual activity: Not  on file  Other Topics Concern  . Not on file  Social History Narrative   Married. 1 living child. 1 passed early after birth.       FNP endocrinology   App undergrad- graduated [employer]. Masters at [employer].       Regular exercise: yes, biking, run, weight lifting- back to crossfit, mountain biking- slow on this compared to previous with neck  Caffeine use: daily   Current Outpatient Medications on File Prior to Visit  Medication Sig Dispense Refill  . CONTOUR NEXT TEST test strip USE TO TEST BLOOD SUGAR UP TO 10 TIMES A DAY AS INSTRUCTED 300 each 11  . escitalopram (LEXAPRO) 20 MG tablet TAKE 1 TABLET BY MOUTH DAILY. 30 tablet 5  . hydrocortisone-pramoxine (ANALPRAM-HC) 2.5-1 % rectal cream Place 1 application rectally 3 (three) times daily. For up to 7 days 30 g 0  . Ibuprofen-Famotidine (DUEXIS) 800-26.6 MG TABS Take 1 tablet by mouth 3 (three) times daily as needed. 1 tab po tid X 14 days then 1 tab po tid as needed 90 tablet 2  . insulin degludec (TRESIBA FLEXTOUCH) 100 UNIT/ML SOPN FlexTouch Pen Inject 0.2-0.26 mLs (20-26 Units total) into the skin daily. 5 pen 5  . insulin lispro (HUMALOG) 100 UNIT/ML cartridge Inject up to 30 units daily under skin as advised 15 mL 5  . insulin lispro (HUMALOG) 100 UNIT/ML injection INJECT UP TO 75 UNITS INTO PUMP DAILY AS ADVISED 30 mL 5  . Insulin Pen Needle (BD PEN NEEDLE NANO U/F) 32G X 4 MM MISC Use 4x a day 200 each 11  . Naproxen-Esomeprazole (VIMOVO) 500-20 MG TBEC Take 1 tablet by mouth 2 (two) times daily. 180 tablet 0  . oseltamivir (TAMIFLU) 75 MG capsule Take 1 capsule (75 mg total) by mouth daily. For prophylaxis (Patient not taking: Reported on 11/23/2018) 10 capsule 0   No current facility-administered medications on file prior to visit.    Allergies  Allergen Reactions  . Penicillins Rash   Family History  Problem Relation Age of Onset  . Thyroid disease Mother   . Hyperlipidemia Mother   . Depression Mother   . GER disease  Mother   . Hypertension Mother   . Other Mother        prediabetes  . Hyperlipidemia Father   . Alcohol abuse Maternal Grandmother   . Diabetes Maternal Grandmother   . Cancer Maternal Grandfather        colon and prostate. age 50 in 2018  . Stroke Maternal Grandfather        in 90s  . Dementia Maternal Grandfather        vascular  . Healthy Sister    PE: BP 110/70   Pulse 62   Ht 5' 8.5" (1.74 m)   Wt 172 lb (78 kg)   SpO2 99%   BMI 25.77 kg/m  Body mass index is 25.77 kg/m. Wt Readings from Last 3 Encounters:  11/23/18 172 lb (78 kg)  04/16/18 171 lb (77.6 kg)  09/15/17 174 lb 9.6 oz (79.2 kg)   Constitutional: normal weight, in NAD Eyes: PERRLA, EOMI, no exophthalmos ENT: moist mucous membranes, no thyromegaly, no cervical lymphadenopathy Cardiovascular: RRR, No MRG Respiratory: CTA B Gastrointestinal: abdomen soft, NT, ND, BS+ Musculoskeletal: no deformities, strength intact in all 4 Skin: moist, warm, no rashes Neurological: no tremor with outstretched hands, DTR normal in all 4  ASSESSMENT: 1. DM1, controlled, without complications except hypoglycemia  2. Hashimoto thyroiditis - Euthyroid  PLAN:  1. Patient with longstanding, well-controlled type 1 diabetes, with history of hypoglycemic episodes, now improved, with only milder lows now on his current insulin pump.  We reviewed together the downloads from his T slim pump + integrated Dexcom CGM.  He is happy with the current pump and the control IQ system, which he uses in the sleep mode.  This will allow him to stay within  110-120 range and does not provide supplemental boluses compared to the regular control IQ.  He feels that this is giving him a more stable control. -Reviewing his Dexcom + pump downloads, it appears that he occasionally has increased blood sugars after meals, but he continues to adjust his insulin to carb ratios and these have improved.  However, he does drop his sugars after 9-10 PM and he can  get even lower than 70 in the middle of the night, usually between 1 and 3 AM.  He is reticent to change his basal rates around this times as this is already quite low, but I advised him to try to decrease the basal rate before this interval, from 10 PM to 12 AM.  He agrees to try this.  He is eating well and early dinner and I do not feel that his hypoglycemia during the night is related to his bolusing with dinner. -Also, he has low blood sugars between meals but these are corrected by the automatic decreasing basal rate from the control IQ.  We did discuss that he may need a lower basal rate in the afternoon, but he would not want to change this for now. -Otherwise, his sugars appear well controlled and she is very knowledgeable about his type 1 diabetes and continues to do a good job adjusting the pump settings in response to his patterns. -He has a short acting insulin time of 2.5 hours, which appears to be appropriate for him. -He is trying to bolus 15 minutes before a meal -I advised him to: Patient Instructions  Please continue: - Basal rates:   12 am: 0.500, ISF 1:45, ICR 1:10 3 am : 0.800, ISF 1:40, ICR 1:10 7 am: 0.700, ISF 1:40, ICR 1:9 10 am: 0.750, ISF 1:40, ICR 1:9 2 pm: 0.765, ISF 1:45, ICR 1:9 5:30 pm: 0.700, ISF 1:45, ICR 1:10  Try to decrease basal rate 10 pm-12 am: 0.650  - target CBG: 110 - Active insulin time: 2.5h  Please return in 6 months with your sugar log.  - we checked his HbA1c: 6.4% (slightly higher) - advised to check sugars at different times of the day - >4x a day, rotating check times - advised for yearly eye exams >> he is UTD -We will check his annual labs today (nonfasting) - return to clinic in 6 months  2. Hashimoto's thyroiditis -TFTs were normal at last check -No signs or symptoms of hypothyroidism - Recheck TFTs today  - time spent with the patient: 40 min, of which >50% was spent in reviewing his pump and CGM downloads, discussing his  hypo- and hyper-glycemic episodes, reviewing previous labs and pump settings and developing a plan to avoid hypo- and hyper-glycemia.   Component     Latest Ref Rng & Units 11/23/2018  Glucose     65 - 99 mg/dL 60 (L)  BUN     7 - 25 mg/dL 10  Creatinine     1.470.60 - 1.35 mg/dL 8.291.05  GFR, Est Non African American     > OR = 60 mL/min/1.2973m2 93  GFR, Est African American     > OR = 60 mL/min/1.773m2 108  BUN/Creatinine Ratio     6 - 22 (calc) NOT APPLICABLE  Sodium     135 - 146 mmol/L 141  Potassium     3.5 - 5.3 mmol/L 3.9  Chloride     98 - 110 mmol/L 101  CO2     20 - 32 mmol/L  31  Calcium     8.6 - 10.3 mg/dL 9.8  Total Protein     6.1 - 8.1 g/dL 7.0  Albumin MSPROF     3.6 - 5.1 g/dL 4.7  Globulin     1.9 - 3.7 g/dL (calc) 2.3  AG Ratio     1.0 - 2.5 (calc) 2.0  Total Bilirubin     0.2 - 1.2 mg/dL 0.6  Alkaline phosphatase (APISO)     36 - 130 U/L 61  AST     10 - 40 U/L 26  ALT     9 - 46 U/L 14  Cholesterol     <200 mg/dL 161176  HDL Cholesterol     > OR = 40 mg/dL 72  Triglycerides     <096<150 mg/dL 38  LDL Cholesterol (Calc)     mg/dL (calc) 93  Total CHOL/HDL Ratio     <5.0 (calc) 2.4  Non-HDL Cholesterol (Calc)     <130 mg/dL (calc) 045104  Creatinine, Urine     20 - 320 mg/dL 33  Microalb, Ur     mg/dL <4.0<0.2  MICROALB/CREAT RATIO     <30 mcg/mg creat NOTE  Hemoglobin A1C     4.0 - 5.6 % 6.4 (A)  TSH     0.40 - 4.50 mIU/L 4.84 (H)  Triiodothyronine,Free,Serum     2.3 - 4.2 pg/mL 3.0  T4,Free(Direct)     0.8 - 1.8 ng/dL 0.9   Tests are normal with a slightly low glucose and also a slightly high TSH.  Free T4 and free T3 are normal.  No intervention is needed for now but I would like to recheck his thyroid tests in 2 months.  Carlus Pavlovristina Wrigley Plasencia, MD PhD [facility]

## 2018-11-23 NOTE — Patient Instructions (Addendum)
Please continue: - Basal rates:   12 am: 0.500, ISF 1:45, ICR 1:10 3 am : 0.800, ISF 1:40, ICR 1:10 7 am: 0.700, ISF 1:40, ICR 1:9 10 am: 0.750, ISF 1:40, ICR 1:9 2 pm: 0.765, ISF 1:45, ICR 1:9 5:30 pm: 0.700, ISF 1:45, ICR 1:10  Try to decrease basal rate 10 pm-12 am: 0.650  - target CBG: 110 - Active insulin time: 2.5h  Please return in 6 months with your sugar log.

## 2018-11-24 LAB — COMPLETE METABOLIC PANEL WITH GFR
AG Ratio: 2 (calc) (ref 1.0–2.5)
ALT: 14 U/L (ref 9–46)
AST: 26 U/L (ref 10–40)
Albumin: 4.7 g/dL (ref 3.6–5.1)
Alkaline phosphatase (APISO): 61 U/L (ref 36–130)
BUN: 10 mg/dL (ref 7–25)
CO2: 31 mmol/L (ref 20–32)
Calcium: 9.8 mg/dL (ref 8.6–10.3)
Chloride: 101 mmol/L (ref 98–110)
Creat: 1.05 mg/dL (ref 0.60–1.35)
GFR, Est African American: 108 mL/min/{1.73_m2} (ref >=60)
GFR, Est Non African American: 93 mL/min/{1.73_m2} (ref >=60)
Globulin: 2.3 g/dL (calc) (ref 1.9–3.7)
Glucose, Bld: 60 mg/dL — ABNORMAL LOW (ref 65–99)
Potassium: 3.9 mmol/L (ref 3.5–5.3)
Sodium: 141 mmol/L (ref 135–146)
Total Bilirubin: 0.6 mg/dL (ref 0.2–1.2)
Total Protein: 7 g/dL (ref 6.1–8.1)

## 2018-11-24 LAB — LIPID PANEL
Cholesterol: 176 mg/dL (ref <200)
HDL: 72 mg/dL (ref >=40)
LDL Cholesterol (Calc): 93 mg/dL (calc)
Non-HDL Cholesterol (Calc): 104 mg/dL (calc) (ref <130)
Total CHOL/HDL Ratio: 2.4 (calc) (ref <5.0)
Triglycerides: 38 mg/dL (ref <150)

## 2018-11-24 LAB — T4, FREE: Free T4: 0.9 ng/dL (ref 0.8–1.8)

## 2018-11-24 LAB — MICROALBUMIN / CREATININE URINE RATIO
Creatinine, Urine: 33 mg/dL (ref 20–320)
Microalb, Ur: <0.2 mg/dL

## 2018-11-24 LAB — T3, FREE: T3, Free: 3 pg/mL (ref 2.3–4.2)

## 2018-11-24 LAB — TSH: TSH: 4.84 mIU/L — ABNORMAL HIGH (ref 0.40–4.50)

## 2018-11-24 LAB — EXTRA URINE SPECIMEN

## 2018-11-26 ENCOUNTER — Encounter: Payer: Self-pay | Admitting: Internal Medicine

## 2018-11-26 MED ORDER — FLUOXETINE HCL 40 MG PO CAPS: 40.0000 mg | ORAL_CAPSULE | Freq: Every day | ORAL | 3 refills | Status: DC

## 2018-11-26 NOTE — Addendum Note (Signed)
Addended by: Marin Olp on: 11/26/2018 09:25 PM   Modules accepted: Orders

## 2018-11-27 MED FILL — FLUoxetine HCL 40 MG CAPS: 40 | 90 days supply | Qty: 90 | Fill #0

## 2018-11-29 ENCOUNTER — Encounter: Payer: Self-pay | Admitting: Family Medicine

## 2018-11-29 ENCOUNTER — Ambulatory Visit (INDEPENDENT_AMBULATORY_CARE_PROVIDER_SITE_OTHER): Payer: No Typology Code available for payment source | Admitting: Family Medicine

## 2018-11-29 ENCOUNTER — Other Ambulatory Visit: Payer: Self-pay

## 2018-11-29 VITALS — BP 100/70 | HR 68 | Temp 98.1°F | Ht 69.0 in | Wt 174.6 lb

## 2018-11-29 DIAGNOSIS — F329 Major depressive disorder, single episode, unspecified: Secondary | ICD-10-CM

## 2018-11-29 DIAGNOSIS — F419 Anxiety disorder, unspecified: Secondary | ICD-10-CM | POA: Diagnosis not present

## 2018-11-29 DIAGNOSIS — Z23 Encounter for immunization: Secondary | ICD-10-CM

## 2018-11-29 DIAGNOSIS — Z0000 Encounter for general adult medical examination without abnormal findings: Secondary | ICD-10-CM

## 2018-11-29 DIAGNOSIS — F32A Depression, unspecified: Secondary | ICD-10-CM

## 2018-11-29 DIAGNOSIS — E663 Overweight: Secondary | ICD-10-CM | POA: Diagnosis not present

## 2018-11-29 NOTE — Progress Notes (Signed)
Phone: [phone]    Subjective:  Patient presents today for their annual physical. Chief complaint-noted.   See problem oriented charting- ROS- full  review of systems was completed and negative except for: cold intolerance, depressed mood. No SI.   The following were reviewed and entered/updated in epic: Past Medical History:  Diagnosis Date  . Chicken pox   . Depression   . Diabetes mellitus without complication ([place])   . Eating disorder    Patient Active Problem List   Diagnosis Date Noted  . Diabetes type 1, controlled ([place]) 01/07/2013    Priority: High  . Hashimoto's thyroiditis 06/26/2015    Priority: Medium  . Anxiety and depression 01/07/2013    Priority: Medium  . 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    Priority: Low  . GERD (gastroesophageal reflux disease) 01/07/2013    Priority: Low  . Bulimia 01/07/2013    Priority: Low   Past Surgical History:  Procedure Laterality Date  . APPENDECTOMY    . LASIK     2013 or 2014  . WISDOM TOOTH EXTRACTION      Family History  Problem Relation Age of Onset  . Thyroid disease Mother   . Hyperlipidemia Mother   . Depression Mother   . GER disease Mother   . Hypertension Mother   . Other Mother        prediabetes  . Hyperlipidemia Father   . Alcohol abuse Maternal Grandmother   . Diabetes Maternal Grandmother   . Cancer Maternal Grandfather        colon and prostate. age 89 in 2018  . Stroke Maternal Grandfather        in 90s  . Dementia Maternal Grandfather        vascular  . Healthy Sister     Medications- reviewed and updated Current Outpatient Medications  Medication Sig Dispense Refill  . CONTOUR NEXT TEST test strip USE TO TEST BLOOD SUGAR UP TO 10 TIMES A DAY AS INSTRUCTED 300 each 11  . FLUoxetine (PROZAC) 40 MG capsule Take 1 capsule (40 mg total) by mouth daily. 90 capsule 3  . hydrocortisone-pramoxine (ANALPRAM-HC) 2.5-1 % rectal cream Place 1 application rectally 3  (three) times daily. For up to 7 days 30 g 0  . Ibuprofen-Famotidine (DUEXIS) 800-26.6 MG TABS Take 1 tablet by mouth 3 (three) times daily as needed. 1 tab po tid X 14 days then 1 tab po tid as needed 90 tablet 2  . insulin degludec (TRESIBA FLEXTOUCH) 100 UNIT/ML SOPN FlexTouch Pen Inject 0.2-0.26 mLs (20-26 Units total) into the skin daily. 5 pen 5  . insulin lispro (HUMALOG) 100 UNIT/ML cartridge Inject up to 30 units daily under skin as advised 15 mL 5  . insulin lispro (HUMALOG) 100 UNIT/ML injection INJECT UP TO 75 UNITS INTO PUMP DAILY AS ADVISED 30 mL 5  . Insulin Pen Needle (BD PEN NEEDLE NANO U/F) 32G X 4 MM MISC Use 4x a day 200 each 11   No current facility-administered medications for this visit.     Allergies-reviewed and updated Allergies  Allergen Reactions  . Penicillins Rash    Social History   Social History Narrative   Married. Daughter 45 years old and Dian Situ 50 year old in 2020. One child passed early -after birth.       FNP endocrinology   App undergrad- graduated [employer]. Masters at [employer].       Regular exercise: yes, biking, run, weight lifting- back to crossfit,  mountain biking- slow on this compared to previous with neck      Caffeine use: daily     Objective:  BP 100/70 (BP Location: Left Arm, Patient Position: Sitting, Cuff Size: Normal)   Pulse 68   Temp 98.1 F (36.7 C) (Oral)   Ht 5\' 9"  (1.753 m)   Wt 174 lb 9.6 oz (79.2 kg)   SpO2 97%   BMI 25.78 kg/m  Gen: NAD, resting comfortably HEENT: Mucous membranes are moist. Oropharynx normal.  Tympanic membranes normal Neck: no obvious thyromegaly or cervical lymphadenopathy CV: RRR no murmurs rubs or gallops Lungs: CTAB no crackles, wheeze, rhonchi Abdomen: soft/nontender/nondistended/normal bowel sounds. No rebound or guarding.  Ext: no edema and 2+ PT pulses Skin: warm, dry Neuro: grossly normal, moves all extremities, PERRLA  Diabetic Foot Exam - Simple   Simple Foot Form Diabetic Foot exam was  performed with the following findings: Yes 11/29/2018  4:10 PM  Visual Inspection No deformities, no ulcerations, no other skin breakdown bilaterally: Yes Sensation Testing Intact to touch and monofilament testing bilaterally: Yes Pulse Check Posterior Tibialis and Dorsalis pulse intact bilaterally: Yes Comments       Assessment and Plan:  33 y.o. male presenting for annual physical.  Health Maintenance counseling: 1. Anticipatory guidance: Patient counseled regarding regular dental exams -q6 months, eye exams - yearly,  avoiding smoking and second hand smoke , limiting alcohol to 2 beverages per day- doesn't drink.   2. Risk factor reduction:  Advised patient of need for regular exercise and diet rich and fruits and vegetables to reduce risk of heart attack and stroke. Exercise- exercising daily. Diet- reasonably healthy.  Wt Readings from Last 3 Encounters:  11/29/18 174 lb 9.6 oz (79.2 kg)  11/23/18 172 lb (78 kg)  04/16/18 171 lb (77.6 kg)  3. Immunizations/screenings/ancillary studies- Tdap today Immunization History  Administered Date(s) Administered  . Influenza-Unspecified 02/14/2012, 02/12/2013, 02/13/2017  . Pneumococcal Polysaccharide-23 05/24/2012  . Tdap 03/26/2008, 11/29/2018  4. Prostate cancer screening-  no family history, start at age 33 5. Colon cancer screening -  no family history, start at age 33-50. Grandfather had bad diverticulitis and colon partial resection in 50s but no colon cancer 6. Skin cancer screening/prevention- no dermatologist. advised regular sunscreen use. Denies worrisome, changing, or new skin lesions.  7. Testicular cancer screening- advised monthly self exams  8. STD screening- patient opts out as monogomous 9. Never smoker  Status of chronic or acute concerns  DM - Taking Humalog via insulin pump and [place]. Follows with [name] - playing with his basal as occasionally has nighttime issues with lows.  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  Elevated TSH- following with [name]- has repeat labs planned - cold intolerance for a few years  GERD - no recent issues  Osteoarthritis - Taking duexis as needed for necks or super burned out from crossfit- sparingly uses.   Depression/Anxiety - Taking Fluoxetine 40 mg daily. Recently changed from lexapro 20mg  as patient was not seeing significant benefit.   - wife wants him to be happier.  Patient feels realistically after the death of his son Chestine SporeClark at a young age and with the stress of his job that he may not be "happy" regularly -Patient also had to suffer through the trauma of 2 in-laws deaths including mother-in-law's suicide. -counseling has been helpful. Still seeing counselor twice a month sometimes once a month.  -anxiety/poor sleep seem to be the  main issues and he is helping the switch to Prozac is helpful -- when has been off in the past (off zoloft for a few months- was more anxious) but didn't really help with anxiety -PHQ 9 is elevated at 11 today but patient honestly thinking the change will affect anxiety levels- he does not believe medicine has ever really been helpful for his depression. -Asked him to send me an updated PHQ 9 in 6 weeks to at least ensure stability.  No suicidal ideation  Overweight per BMI- has excellent muscle mass and is an ideal shape- BMI is not an accurate representation of his health- would not recommend weight loss- continue healthy eating and regular exercise   Recommended follow up: 1 year CPE Future Appointments  Date Time Provider Department Center  05/31/2019  4:00 PM Carlus PavlovGherghe, Cristina, MD [facility] None   Lab/Order associations:  Already had full labs with [name] so will hold off for now   ICD-10-CM   1. Preventative health care  Z00.00   2. Need for tetanus, diphtheria, and acellular pertussis (Tdap) vaccine in patient of adolescent age or older  Z23 Tdap vaccine greater than or equal to  7yo IM  3. Overweight per BMI- but actually in great shape with low bodyfat E66.3   4. Anxiety and depression  F41.9    F32.9    Return precautions advised.  Tana ConchStephen Ashwika Freels, MD

## 2018-11-29 NOTE — Patient Instructions (Addendum)
Health Maintenance Due  Topic Date Due  . TETANUS/TDAP - done today.  03/26/2018  . FOOT EXAM done today.  04/28/2018  . [place] - scheduled for 01/16/2019- asked him to have them send [place] a copy [phone] 11/10/2018   No changes today- glad you are doing well  Update me in 6 weeks with phq9 and how you feel like prozac is doing for you.

## 2018-11-29 NOTE — Assessment & Plan Note (Signed)
Depression/Anxiety - Taking Fluoxetine 40 mg daily. Recently changed from lexapro 20mg  as patient was not seeing significant benefit.   - wife wants him to be happier.  Patient feels realistically after the death of his son Carlis Abbott at a young age and with the stress of his job that he may not be "happy" regularly -Patient also had to suffer through the trauma of 2 in-laws deaths including mother-in-law's suicide. -counseling has been helpful. Still seeing counselor twice a month sometimes once a month.  -anxiety/poor sleep seem to be the main issues and he is helping the switch to Prozac is helpful -- when has been off in the past (off zoloft for a few months- was more anxious) but didn't really help with anxiety -PHQ 9 is elevated at 11 today but patient honestly thinking the change will affect anxiety levels- he does not believe medicine has ever really been helpful for his depression. -Asked him to send me an updated PHQ 9 in 6 weeks to at least ensure stability.  No suicidal ideation

## 2018-11-30 MED FILL — HumaLOG 100 UNIT/ML SOLN: 100 | 40 days supply | Qty: 30 | Fill #1

## 2018-12-03 ENCOUNTER — Other Ambulatory Visit: Payer: Self-pay

## 2018-12-03 ENCOUNTER — Encounter: Payer: Self-pay | Admitting: Internal Medicine

## 2018-12-03 MED ORDER — INSULIN LISPRO 100 UNIT/ML ~~LOC~~ SOLN: SUBCUTANEOUS | 0 refills | Status: DC

## 2019-01-02 ENCOUNTER — Telehealth: Payer: Self-pay

## 2019-01-02 NOTE — Telephone Encounter (Signed)
Forms for CGM supplies filled out, signed by Dr. Cruzita Lederer and faxed to [facility]  with confirmation.

## 2019-01-16 ENCOUNTER — Other Ambulatory Visit: Payer: Self-pay

## 2019-01-16 ENCOUNTER — Encounter: Payer: Self-pay | Admitting: Internal Medicine

## 2019-01-16 DIAGNOSIS — E063 Autoimmune thyroiditis: Secondary | ICD-10-CM

## 2019-01-16 NOTE — Addendum Note (Signed)
Addended by: Kaylyn Lim I on: 01/16/2019 03:09 PM   Modules accepted: Orders

## 2019-01-16 NOTE — Addendum Note (Signed)
Addended by: STONE-ELMORE, Trameka Dorough I on: 01/16/2019 03:09 PM   Modules accepted: Orders  

## 2019-01-16 NOTE — Addendum Note (Signed)
Addended by: STONE-ELMORE, Miria Cappelli I on: 01/16/2019 03:09 PM   Modules accepted: Orders  

## 2019-01-17 LAB — T3, FREE: T3, Free: 2.8 pg/mL (ref 2.3–4.2)

## 2019-01-17 LAB — T4, FREE: Free T4: 1 ng/dL (ref 0.8–1.8)

## 2019-01-17 LAB — TSH: TSH: 2.58 mIU/L (ref 0.40–4.50)

## 2019-01-17 MED FILL — HumaLOG 100 UNIT/ML SOLN: 100 | 40 days supply | Qty: 30 | Fill #2

## 2019-01-23 ENCOUNTER — Encounter: Payer: Self-pay | Admitting: Family Medicine

## 2019-02-14 ENCOUNTER — Encounter: Payer: Self-pay | Admitting: Family Medicine

## 2019-02-22 MED FILL — HumaLOG 100 UNIT/ML SOLN: 100 | 40 days supply | Qty: 30 | Fill #3

## 2019-02-26 ENCOUNTER — Encounter: Payer: Self-pay | Admitting: Family Medicine

## 2019-02-26 ENCOUNTER — Other Ambulatory Visit: Payer: Self-pay

## 2019-02-26 DIAGNOSIS — M25559 Pain in unspecified hip: Secondary | ICD-10-CM

## 2019-03-01 ENCOUNTER — Encounter: Payer: Self-pay | Admitting: Family Medicine

## 2019-03-04 MED ORDER — ESCITALOPRAM OXALATE 20 MG PO TABS: 20.0000 mg | ORAL_TABLET | Freq: Every day | ORAL | 3 refills | Status: DC

## 2019-03-04 MED FILL — ESCITALOPRAM 20 MG TABLET: 20 | 90 days supply | Qty: 90 | Fill #0

## 2019-03-22 DIAGNOSIS — M25852 Other specified joint disorders, left hip: Secondary | ICD-10-CM | POA: Insufficient documentation

## 2019-04-05 MED FILL — HumaLOG 100 UNIT/ML SOLN: 100 | 40 days supply | Qty: 30 | Fill #4

## 2019-04-10 ENCOUNTER — Other Ambulatory Visit: Payer: Self-pay | Admitting: Orthopedic Surgery

## 2019-04-10 ENCOUNTER — Other Ambulatory Visit (HOSPITAL_COMMUNITY): Payer: Self-pay | Admitting: Orthopedic Surgery

## 2019-04-10 DIAGNOSIS — M25552 Pain in left hip: Secondary | ICD-10-CM

## 2019-04-20 ENCOUNTER — Ambulatory Visit (HOSPITAL_COMMUNITY): Payer: No Typology Code available for payment source

## 2019-04-20 ENCOUNTER — Encounter (HOSPITAL_COMMUNITY): Payer: Self-pay

## 2019-04-25 ENCOUNTER — Encounter: Payer: Self-pay | Admitting: Family Medicine

## 2019-05-13 MED FILL — HumaLOG 100 UNIT/ML SOLN: 100 | 40 days supply | Qty: 30 | Fill #5

## 2019-05-15 MED FILL — oxyCODONE HCL 5 MG TABS: 5 | 5 days supply | Qty: 30 | Fill #0

## 2019-05-15 MED FILL — METHOCARBAMOL 750 MG TABS: 750 | 13 days supply | Qty: 40 | Fill #0

## 2019-05-15 MED FILL — DOXYCYCLINE HYC 20 MG TAB: 20 | 28 days supply | Qty: 28 | Fill #0

## 2019-05-15 MED FILL — ONDANSETRON HCL 4 MG TABLET: 4 | 7 days supply | Qty: 20 | Fill #0

## 2019-05-15 MED FILL — DICLOFENAC SODIUM 75 MG TAB: 75 | 14 days supply | Qty: 28 | Fill #0

## 2019-05-31 ENCOUNTER — Ambulatory Visit: Payer: No Typology Code available for payment source | Admitting: Internal Medicine

## 2019-07-10 ENCOUNTER — Other Ambulatory Visit: Payer: Self-pay | Admitting: Internal Medicine

## 2019-07-10 MED FILL — HumaLOG 100 UNIT/ML SOLN: 100 | 40 days supply | Qty: 30 | Fill #0

## 2019-08-05 LAB — HEMOGLOBIN A1C: Hemoglobin A1C: 5.7

## 2019-08-06 ENCOUNTER — Ambulatory Visit: Payer: No Typology Code available for payment source | Admitting: Internal Medicine

## 2019-08-06 ENCOUNTER — Encounter: Payer: Self-pay | Admitting: Internal Medicine

## 2019-08-06 ENCOUNTER — Other Ambulatory Visit: Payer: Self-pay

## 2019-08-06 VITALS — Ht 69.0 in

## 2019-08-06 DIAGNOSIS — E109 Type 1 diabetes mellitus without complications: Secondary | ICD-10-CM

## 2019-08-06 DIAGNOSIS — E063 Autoimmune thyroiditis: Secondary | ICD-10-CM

## 2019-08-06 LAB — HEMOGLOBIN A1C: A1c: 5.7

## 2019-08-06 NOTE — Patient Instructions (Addendum)
Please continue: - Basal rates:   12 am: 0.500, ISF 1:55, ICR 1:10 3 am : 0.800, ISF 1:55, ICR 1:10 7 am: 0.800, ISF 1:40, ICR 1:9 10 am: 0.800, ISF 1:35, ICR 1:9 4 pm: 0.800, ISF 1:40, ICR 1:9 5:30 pm: 0.700, ISF 1:45, ICR 1:9 - target CBG: 110 - Active insulin time: 2.5 h  Please return in 6 months.

## 2019-08-06 NOTE — Progress Notes (Signed)
Patient ID: Mark Barr, male   DOB: Oct 11, 1985, 34 y.o.   MRN: [id number]  This visit occurred during the SARS-CoV-2 public health emergency.  Safety protocols were in place, including screening questions prior to the visit, additional usage of staff PPE, and extensive cleaning of exam room while observing appropriate contact time as indicated for disinfecting solutions.   HPI: Mark Barr is a 34 y.o.-year-old male, returning for f/u for DM1, dx 49 (at 34 y/o), controlled, w/o complications, on insulin pump since 2000. Last visit 6 months ago.  He had hip surgery in 04/2019.  + recent nausea from Prozac >> now back on Lexapro.  She is on an insulin pump.  Previous pumps: - Medtronic Revel 530 G loaner - Omnipod since12/09/2013 >> loved it, however he developed a blistering a rash and the attaching site (started to use Flonase before attaching the pump, which helped) - Medtronic 723  - Medtronic 670G since 10/2015 -T:slim X2 since 03/2017 + Dexcom G6 since 01/2017.  He is now using the control IQ technology.  He likes this new system and feels this is a good match for him.Marland Kitchen  He initially tried NovoLog and Fiasp in the pump, but did not see a difference between the 2 in the 670 G pump.  Now on T slim, he felt [place]ie [place] was working better for him.  However, he could not continue with this and we had to switch to Humalog per insurance preference.  He exercising consistently, but not doing CrossFit after his surgery as he is still recovering.  Reviewed HbA1c levels: Lab Results  Component Value Date   HGBA1C 6.4 (A) 11/23/2018   HGBA1C 6.3 04/16/2018   HGBA1C 6.6 (H) 02/26/2016   HGBA1C 6.5 10/01/2015   HGBA1C 7.1 (H) 06/25/2015   HGBA1C 6.8 02/19/2015   HGBA1C 6.5 08/25/2014   HGBA1C 7.2 (H) 04/28/2014   HGBA1C 6.9 (H) 12/24/2013   HGBA1C 6.1 09/16/2013   HGBA1C 6.6 (H) 05/13/2013   HGBA1C 6.9 (H) 02/11/2013  04/10/2018: HbA1c 6.3% 09/15/2017: HbA1c 6.4%  CGM parameters:  - will scan reports - Average from CGM: 138 +/- 4 >> 147 >> 141 >> 143 >> 136  Time in range:  - low (<80): 6% >> 15% >>  10% >> 7% - normal range (80-180): 70% >> 69% >> 70% >> 78% - high sugars (>180): 24% >> 16% >> 10% >> 15%  He uses his sensor 96% >> 98% of the time.  He usually uses the sleep well throughout the day and night.  Pump settings:   - Basal rates:   12 am: 0.500, ISF 1:55, ICR 1:10 3 am : 0.800, ISF 1:55, ICR 1:10 7 am: 0.800, ISF 1:40, ICR 1:9 10 am: 0.800, ISF 1:35, ICR 1:9 4 pm: 0.800, ISF 1:40, ICR 1:9 5:30 pm: 0.700, ISF 1:45, ICR 1:9 - target CBG: 110 - Active insulin time: 3h >> 2.5h Total daily dose from basal: 37% >> 36% (13 units a day) >> 19 >> 20 units a day Total daily dose from bolus 63% >> 64% (23 units a day) >> 24 >> 23 units a day Uses 42-60 units a day. Highest CBG: 328 (site problem) >> 300 >> 340 >> 340, lowest CBG: 47 >> 51 >> 50s >> 40 - maybe CGM error.  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   -No CKD Lab Results  Component Value Date   BUN 10 11/23/2018   CREATININE 1.05 11/23/2018   -Normal ACR: Lab Results  Component Value Date   MICRALBCREAT NOTE 11/23/2018   MICRALBCREAT 0.9 02/26/2016   MICRALBCREAT 2.4 02/23/2015   MICRALBCREAT 0.5 12/24/2013   MICRALBCREAT 0.3 02/11/2013   -No HL; last set of lipids:  Lab Results  Component Value Date   CHOL 176 11/23/2018   HDL 72 11/23/2018   LDLCALC 93 11/23/2018   TRIG 38 11/23/2018   CHOLHDL 2.4 11/23/2018   - last eye exam was in 10/2018: +PDR OU reportedly - stable -He did not numbness and tingling in his feet.  Hashimoto thyroiditis: -Not on levothyroxine  Latest TSH was normal: Lab Results   Component Value Date   TSH 2.58 01/16/2019   TSH 4.84 (H) 11/23/2018   TSH 3.85 09/25/2017   TSH 1.61 09/27/2016   TSH 2.24 02/26/2016   TSH 2.27 06/25/2015   TSH 4.50 02/23/2015   TSH 1.34 12/24/2013   TSH 2.35 02/11/2013   TSH 1.98 10/10/2012   In summer 2017, they lost their second baby at 24 weeks (hydrops), after wife contracted CMV virus.  In July 2019, his wife gave birth to his second daughter.  ROS: Constitutional: no weight gain/no weight loss, no fatigue, no subjective hyperthermia, no subjective hypothermia Eyes: no blurry vision, no xerophthalmia ENT: no sore throat, no nodules palpated in neck, no dysphagia, no odynophagia, no hoarseness Cardiovascular: no CP/no SOB/no palpitations/no leg swelling Respiratory: no cough/no SOB/no wheezing Gastrointestinal: no N/no V/no D/no C/no acid reflux Musculoskeletal: no muscle aches/+ joint aches Skin: no rashes, no hair loss Neurological: no tremors/no numbness/no tingling/no dizziness  I reviewed pt's medications, allergies, PMH, social hx, family hx, and changes were documented in the history of present illness. Otherwise, unchanged from my initial visit note.  Past Medical History:  Diagnosis Date  . Chicken pox   . Depression   . Diabetes mellitus without complication ([place])   . Eating disorder    Past Surgical History:  Procedure Laterality Date  . APPENDECTOMY    . LASIK     2013 or 2014  . WISDOM TOOTH EXTRACTION     Social History   Socioeconomic History  . Marital status: Married    Spouse name: Not on file  . Number of children: Not on file  . Years of education: Not on file  . Highest education level: Not on file  Occupational History  . Occupation: Engineer, technical sales: [employer]  Tobacco Use  . Smoking status: Never Smoker  . Smokeless tobacco: Never Used  Substance and Sexual Activity  . Alcohol use: No  . Drug use: No  . Sexual activity: Yes  Other Topics Concern  . Not on  file  Social History Narrative   Married. Daughter 85 years old and Dian Situ 32 year old in 2020. One child passed early -after birth.       FNP endocrinology   App undergrad- graduated [employer]. Masters at [employer].       Regular exercise: yes, biking, run, weight lifting- back to crossfit, mountain biking- slow on this compared to previous with neck      Caffeine use: daily   Social Determinants of Health   Financial Resource Strain:   . Difficulty of  Paying Living Expenses:   Food Insecurity:   . Worried About Programme researcher, broadcasting/film/video in the Last Year:   . Barista in the Last Year:   Transportation Needs:   . Freight forwarder (Medical):   Marland Kitchen Lack of Transportation (Non-Medical):   Physical Activity:   . Days of Exercise per Week:   . Minutes of Exercise per Session:   Stress:   . Feeling of Stress :   Social Connections:   . Frequency of Communication with Friends and Family:   . Frequency of Social Gatherings with Friends and Family:   . Attends Religious Services:   . Active Member of Clubs or Organizations:   . Attends Banker Meetings:   Marland Kitchen Marital Status:   Intimate Partner Violence:   . Fear of Current or Ex-Partner:   . Emotionally Abused:   Marland Kitchen Physically Abused:   . Sexually Abused:    Current Outpatient Medications on File Prior to Visit  Medication Sig Dispense Refill  . CONTOUR NEXT TEST test strip USE TO TEST BLOOD SUGAR UP TO 10 TIMES A DAY AS INSTRUCTED 300 each 11  . escitalopram (LEXAPRO) 20 MG tablet Take 1 tablet (20 mg total) by mouth daily. 90 tablet 3  . hydrocortisone-pramoxine (ANALPRAM-HC) 2.5-1 % rectal cream Place 1 application rectally 3 (three) times daily. For up to 7 days 30 g 0  . Ibuprofen-Famotidine (DUEXIS) 800-26.6 MG TABS Take 1 tablet by mouth 3 (three) times daily as needed. 1 tab po tid X 14 days then 1 tab po tid as needed 90 tablet 2  . insulin degludec (TRESIBA FLEXTOUCH) 100 UNIT/ML SOPN FlexTouch Pen Inject 0.2-0.26 mLs  (20-26 Units total) into the skin daily. 5 pen 5  . insulin lispro (HUMALOG) 100 UNIT/ML injection INJECT UP TO 75 UNITS INTO PUMP DAILY AS ADVISED 30 mL 5  . Insulin Pen Needle (BD PEN NEEDLE NANO U/F) 32G X 4 MM MISC Use 4x a day 200 each 11  . FLUoxetine (PROZAC) 40 MG capsule Take 1 capsule (40 mg total) by mouth daily. 90 capsule 3  . insulin lispro (HUMALOG) 100 UNIT/ML cartridge Inject up to 30 units daily under skin as advised (Patient not taking: Reported on 08/06/2019) 15 mL 5   No current facility-administered medications on file prior to visit.   Allergies  Allergen Reactions  . Penicillins Rash   Family History  Problem Relation Age of Onset  . Thyroid disease Mother   . Hyperlipidemia Mother   . Depression Mother   . GER disease Mother   . Hypertension Mother   . Other Mother        prediabetes  . Hyperlipidemia Father   . Alcohol abuse Maternal Grandmother   . Diabetes Maternal Grandmother   . Cancer Maternal Grandfather        colon and prostate. age 62 in 2018  . Stroke Maternal Grandfather        in 90s  . Dementia Maternal Grandfather        vascular  . Healthy Sister    PE: Ht 5\' 9"  (1.753 m)   BMI 25.78 kg/m  Body mass index is 25.78 kg/m. Wt Readings from Last 3 Encounters:  11/29/18 174 lb 9.6 oz (79.2 kg)  11/23/18 172 lb (78 kg)  04/16/18 171 lb (77.6 kg)   Constitutional: Normal weight, in NAD Eyes: PERRLA, EOMI, no exophthalmos ENT: moist mucous membranes, no thyromegaly, no cervical lymphadenopathy Cardiovascular: RRR, No  MRG Respiratory: CTA B Gastrointestinal: abdomen soft, NT, ND, BS+ Musculoskeletal: no deformities, strength intact in all 4 Skin: moist, warm, no rashes Neurological: no tremor with outstretched hands, DTR normal in all 4  ASSESSMENT: 1. DM1, controlled, without complications except hypoglycemia  2. Hashimoto thyroiditis - Euthyroid  PLAN:  1. Patient with longstanding, well-controlled, type 1 diabetes, with  history of hypoglycemic episodes, improved with only mild lows on his current insulin pump.  We reviewed together the downloads from his t:slim pump + integrated Dexcom CGM.  His happy with the current system and uses the control IQ system in the sleep mode.  This will allow him to stay within 110-120 range and does not provide supplemental boluses compared to the regular control IQ.  He feels that this is giving him more stable control. -He is very knowledgeable about his type 1 diabetes and continues to do good job adjusting the pump settings in response to his patterns.  He had some low blood sugars between meals but they were corrected by the automatically decrease basal rate from the control IQ.  However, at last visit I did suggest to try to decrease his basal rate in the evening.  At that time, he had some low blood sugars in the 70s in the middle of the night, between 1 and 3 AM.  He was reticent to change his basal rate around this time as these were quite low. -His active insulin time is 2.5 hours, which appears to be appropriate for him -He is trying to bolus 15 minutes before a meal -At this visit, his control remains excellent with occasional low blood sugars in the 70s during the night, which is picked up by the sensor and either maintain or increase slightly, and also occasional higher blood sugars after certain meals.  He also noticed that if he works in the yard all day, he may have some mild lows in the 60s even if he disconnects the pump.  He is having to having snacks with him.  Otherwise, he may have higher blood sugar peak in the morning, not necessarily after breakfast but occasionally after exercise.  Also, after the rest of the meals, he sometimes higher blood sugars.  We discussed that he needs to be careful when she corrects these to drop his sugars 2 months afterwards.  Otherwise, I do not feel he needs any changes in his regimen for now. -I advised him to: Patient Instructions   Please continue: - Basal rates:   12 am: 0.500, ISF 1:45, ICR 1:10 3 am : 0.800, ISF 1:40, ICR 1:10 7 am: 0.700, ISF 1:40, ICR 1:9 10 am: 0.750, ISF 1:40, ICR 1:9 2 pm: 0.765, ISF 1:45, ICR 1:9 5:30 pm: 0.700, ISF 1:45, ICR 1:10 10 pm-12 am: 0.650, ISF 1:45, ICR 1:10  - target CBG: 110 - Active insulin time: 2.5h  Please return in 6 months with your sugar log.  - we checked his HbA1c: 5.7% (excellent) - advised to check sugars at different times of the day - 4x a day, rotating check times - advised for yearly eye exams >> he is UTD - return to clinic in 6 months  2. Hashimoto's thyroiditis -no signs or symptoms of hypothyroidism -Latest TFTs normal -we will repeat them at next visit   Carlus Pavlov, MD PhD [facility] Endocrinology

## 2019-08-21 MED FILL — ESCITALOPRAM 20 MG TABLET: 20 | 90 days supply | Qty: 90 | Fill #1

## 2019-09-16 MED FILL — HumaLOG 100 UNIT/ML SOLN: 100 | 40 days supply | Qty: 30 | Fill #1

## 2019-10-28 ENCOUNTER — Telehealth: Payer: Self-pay

## 2019-10-28 NOTE — Telephone Encounter (Signed)
Form for DM pump supplies and Certificate/Letter of Medical Neccescity have been received, completed, and signed by Dr. Elvera Lennox.  Completed form has been faxed to [facility] with confirmation.

## 2019-11-08 ENCOUNTER — Telehealth: Payer: Self-pay

## 2019-11-08 NOTE — Telephone Encounter (Signed)
Form for Pump supplies, CGM and Certificate/Letter of Medical Neccescity have been received, completed, and signed by Dr. Elvera Lennox.  Completed form has been faxed to [facility] with confirmation.

## 2019-11-22 MED FILL — HumaLOG 100 UNIT/ML SOLN: 100 | 40 days supply | Qty: 30 | Fill #3

## 2020-01-03 LAB — HM DIABETES EYE EXAM

## 2020-01-08 MED FILL — ESCITALOPRAM 20 MG TABLET: 20 | 90 days supply | Qty: 90 | Fill #2

## 2020-01-08 MED FILL — HumaLOG 100 UNIT/ML SOLN: 100 | 40 days supply | Qty: 30 | Fill #4

## 2020-01-31 ENCOUNTER — Telehealth: Payer: Self-pay

## 2020-01-31 NOTE — Telephone Encounter (Signed)
Pt.'s wife is checking if the paperwork for the [facility] was faxed back. She states it was faxed to [place] on Tuesday 9/14

## 2020-02-03 NOTE — Telephone Encounter (Signed)
Paperwork has been faxed to the [facility]. Confirmation fax has been received.

## 2020-02-03 NOTE — Telephone Encounter (Signed)
Patients wife is following up on paperwork from [facility] It is needed asap.

## 2020-03-06 MED FILL — HumaLOG 100 UNIT/ML SOLN: 100 | 40 days supply | Qty: 30 | Fill #5

## 2020-03-25 ENCOUNTER — Ambulatory Visit: Payer: No Typology Code available for payment source | Attending: Internal Medicine

## 2020-03-25 DIAGNOSIS — Z23 Encounter for immunization: Secondary | ICD-10-CM

## 2020-03-25 NOTE — Progress Notes (Signed)
   Covid-19 Vaccination Clinic  Name:  Mark Barr    MRN: [id number] DOB: 1985-12-29  03/25/2020  [name] was observed post Covid-19 immunization for 15 minutes without incident. He was provided with Vaccine Information Sheet and instruction to access the V-Safe system.   [name] was instructed to call 911 with any severe reactions post vaccine: Marland Kitchen Difficulty breathing  . Swelling of face and throat  . A fast heartbeat  . A bad rash all over body  . Dizziness and weakness

## 2020-03-26 ENCOUNTER — Other Ambulatory Visit (HOSPITAL_COMMUNITY): Payer: Self-pay | Admitting: Internal Medicine

## 2020-04-05 ENCOUNTER — Encounter: Payer: Self-pay | Admitting: Internal Medicine

## 2020-04-06 ENCOUNTER — Other Ambulatory Visit: Payer: Self-pay | Admitting: Internal Medicine

## 2020-04-06 DIAGNOSIS — E063 Autoimmune thyroiditis: Secondary | ICD-10-CM

## 2020-04-06 DIAGNOSIS — E109 Type 1 diabetes mellitus without complications: Secondary | ICD-10-CM

## 2020-04-08 ENCOUNTER — Other Ambulatory Visit (INDEPENDENT_AMBULATORY_CARE_PROVIDER_SITE_OTHER): Payer: No Typology Code available for payment source

## 2020-04-08 ENCOUNTER — Other Ambulatory Visit: Payer: Self-pay

## 2020-04-08 DIAGNOSIS — E109 Type 1 diabetes mellitus without complications: Secondary | ICD-10-CM | POA: Diagnosis not present

## 2020-04-08 DIAGNOSIS — E063 Autoimmune thyroiditis: Secondary | ICD-10-CM

## 2020-04-08 LAB — TSH: TSH: 2.67 u[IU]/mL (ref 0.35–4.50)

## 2020-04-08 LAB — LIPID PANEL
Cholesterol: 160 mg/dL (ref 0–200)
HDL: 66.2 mg/dL (ref >39.00)
LDL Cholesterol: 86 mg/dL (ref 0–99)
NonHDL: 93.78
Total CHOL/HDL Ratio: 2
Triglycerides: 40 mg/dL (ref 0.0–149.0)
VLDL: 8 mg/dL (ref 0.0–40.0)

## 2020-04-08 LAB — MICROALBUMIN / CREATININE URINE RATIO
Creatinine,U: 29.1 mg/dL
Microalb Creat Ratio: 2.4 mg/g (ref 0.0–30.0)
Microalb, Ur: <0.7 mg/dL (ref 0.0–1.9)

## 2020-04-08 LAB — T3, FREE: T3, Free: 2.4 pg/mL (ref 2.3–4.2)

## 2020-04-08 LAB — T4, FREE: Free T4: 0.74 ng/dL (ref 0.60–1.60)

## 2020-04-09 LAB — COMPLETE METABOLIC PANEL WITH GFR
AG Ratio: 2 (calc) (ref 1.0–2.5)
ALT: 10 U/L (ref 9–46)
AST: 20 U/L (ref 10–40)
Albumin: 4.3 g/dL (ref 3.6–5.1)
Alkaline phosphatase (APISO): 58 U/L (ref 36–130)
BUN: 11 mg/dL (ref 7–25)
CO2: 28 mmol/L (ref 20–32)
Calcium: 9.3 mg/dL (ref 8.6–10.3)
Chloride: 103 mmol/L (ref 98–110)
Creat: 1.04 mg/dL (ref 0.60–1.35)
GFR, Est African American: 108 mL/min/{1.73_m2} (ref >=60)
GFR, Est Non African American: 93 mL/min/{1.73_m2} (ref >=60)
Globulin: 2.1 g/dL (calc) (ref 1.9–3.7)
Glucose, Bld: 63 mg/dL — ABNORMAL LOW (ref 65–99)
Potassium: 4.2 mmol/L (ref 3.5–5.3)
Sodium: 138 mmol/L (ref 135–146)
Total Bilirubin: 1 mg/dL (ref 0.2–1.2)
Total Protein: 6.4 g/dL (ref 6.1–8.1)

## 2020-04-15 ENCOUNTER — Ambulatory Visit: Payer: No Typology Code available for payment source | Admitting: Internal Medicine

## 2020-04-15 ENCOUNTER — Encounter: Payer: Self-pay | Admitting: Internal Medicine

## 2020-04-16 ENCOUNTER — Ambulatory Visit: Payer: No Typology Code available for payment source | Admitting: Internal Medicine

## 2020-04-16 ENCOUNTER — Other Ambulatory Visit: Payer: Self-pay | Admitting: Internal Medicine

## 2020-04-16 ENCOUNTER — Encounter: Payer: Self-pay | Admitting: Internal Medicine

## 2020-04-16 ENCOUNTER — Other Ambulatory Visit: Payer: Self-pay

## 2020-04-16 VITALS — BP 138/78 | HR 67 | Ht 69.0 in | Wt 175.6 lb

## 2020-04-16 DIAGNOSIS — E063 Autoimmune thyroiditis: Secondary | ICD-10-CM | POA: Diagnosis not present

## 2020-04-16 DIAGNOSIS — E109 Type 1 diabetes mellitus without complications: Secondary | ICD-10-CM

## 2020-04-16 MED ORDER — BAQSIMI TWO PACK 3 MG/DOSE NA POWD: NASAL | 11 refills | Status: DC

## 2020-04-16 MED FILL — BAQSIMI ONE PACK 3 MG/DOSE: 3 | 1 days supply | Qty: 1 | Fill #0

## 2020-04-16 NOTE — Patient Instructions (Addendum)
Please continue: - Basal rates:    12 am: 0.450, ISF 1:55, ICR 1:10 3 am : 0.800, ISF 1:55, ICR 1:10 7 am: 0.750, ISF 1:40, ICR 1:9 10 am: 0.750, ISF 1:40, ICR 1:9 3 pm: 0.650, ISF 1:40, ICR 1:9 9 pm: 0.600, ISF 1:55, ICR 1:10 - target CBG: 110 - Active insulin time: 2.5h  Try to change to [place].  Please return in 6 months.

## 2020-04-16 NOTE — Progress Notes (Signed)
Patient ID: Mark Barr, male   DOB: 08-17-1985, 34 y.o.   MRN: [id number]  This visit occurred during the SARS-CoV-2 public health emergency.  Safety protocols were in place, including screening questions prior to the visit, additional usage of staff PPE, and extensive cleaning of exam room while observing appropriate contact time as indicated for disinfecting solutions.   HPI: Mark Barr is a 34 y.o.-year-old male, returning for f/u for DM1, dx 29 (at 34 y/o), controlled, w/o complications, on insulin pump since 2000. Last visit 8 months ago.  Before last visit, he switched from Prozac to Lexapro due to nausea.  She is on insulin pump: - Medtronic Revel 530 G loaner - Omnipod since12/09/2013 >> loved it, however he developed a blistering a rash and the attaching site (started to use Flonase before attaching the pump, which helped) - Medtronic 723  - Medtronic 670G since 10/2015 -T:slim X2 since 03/2017.  He is using the control IQ technology.  He feels that this is a good match for him.  CGM: -Dexcom G6 since 01/2017  Insulin: -He initially tried NovoLog and Fiasp in the pump, but did not see a difference between the 2 in the 670 G pump.   -After he switched to the T slim pump, he felt Mark Barr was working better for him -However, he is not using Humalog per insurance preference  Reviewed HbA1c levels: Lab Results  Component Value Date   HGBA1C 5.7 08/05/2019   HGBA1C 6.4 (A) 11/23/2018   HGBA1C 6.3 04/16/2018   HGBA1C 6.6 (H) 02/26/2016   HGBA1C 6.5 10/01/2015   HGBA1C 7.1 (H) 06/25/2015   HGBA1C 6.8 02/19/2015   HGBA1C 6.5 08/25/2014   HGBA1C 7.2 (H) 04/28/2014   HGBA1C 6.9 (H) 12/24/2013   HGBA1C 6.1 09/16/2013   HGBA1C 6.6 (H) 05/13/2013   HGBA1C 6.9 (H) 02/11/2013  04/10/2018: HbA1c 6.3% 09/15/2017: HbA1c 6.4%  CGM parameters:  - Average from CGM: 143 >> 136 >> 142 for the last 2 weeks  Time in range:  - low (<80): 10% >> 7% >> 4% - normal range  (80-180): 70% >> 78% >> 78% - high sugars (>180): 10% >> 15% >> 19%  She usually uses the sleep mode throughout the day and the night.  Pump settings:   - Basal rates:   12 am: 0.450, ISF 1:55, ICR 1:10 3 am : 0.800, ISF 1:55, ICR 1:10 7 am: 0.750, ISF 1:40, ICR 1:9 10 am: 0.750, ISF 1:40, ICR 1:9 3 pm: 0.650, ISF 1:40, ICR 1:9 9 pm: 0.600, ISF 1:55, ICR 1:10 - target CBG: 110 - Active insulin time: 3h >> 2.5h   Total daily dose from basal: 37% >> 36% (13 units a day) >> 19 >> 20 >> 16 units a day (43%) Total daily dose from bolus 63% >> 64% (23 units a day) >> 24 >> 23 >>> 23 units a day (57%) Uses 37-60 units a day. - highest CBG:  340 >> 366 - lowest CBG: 40 >> 40  Pt's meals are: - Breakfast:  2 eggs + bagel + coffee, sometimes cereals  - Lunch: sandwich, fruit (apple), veggies (carrots) and, goldfish - Dinner: chicken, rice, and veggies - Snacks: 2 snacks: almonds, fruit, or cheese crackers  Since last visit, he moved to exercise from morning to 5 PM.   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   -No CKD: Lab Results  Component Value Date   BUN 11 04/08/2020   CREATININE  1.04 04/08/2020   -Normal ACR: Lab Results  Component Value Date   MICRALBCREAT 2.4 04/08/2020   MICRALBCREAT NOTE 11/23/2018   MICRALBCREAT 0.9 02/26/2016   MICRALBCREAT 2.4 02/23/2015   MICRALBCREAT 0.5 12/24/2013   MICRALBCREAT 0.3 02/11/2013   -No HL; last set of lipids:  Lab Results  Component Value Date   CHOL 160 04/08/2020   HDL 66.20 04/08/2020   LDLCALC 86 04/08/2020   TRIG 40.0 04/08/2020   CHOLHDL 2 04/08/2020   - last eye exam was in 01/03/2020: + very mild PDR OU. Dr. Charlotte Barr - no numbness and tingling in his feet.  Hashimoto thyroiditis: -He is not on levothyroxine  Latest TFTs were normal: Lab Results  Component Value Date   TSH 2.67 04/08/2020   TSH 2.58 01/16/2019   TSH 4.84 (H) 11/23/2018   TSH 3.85 09/25/2017   TSH 1.61 09/27/2016   TSH  2.24 02/26/2016   TSH 2.27 06/25/2015   TSH 4.50 02/23/2015   TSH 1.34 12/24/2013   TSH 2.35 02/11/2013   TSH 1.98 10/10/2012   Lab Results  Component Value Date   FREET4 0.74 04/08/2020   FREET4 1.0 01/16/2019   FREET4 0.9 11/23/2018   FREET4 0.9 09/25/2017   FREET4 0.78 09/27/2016   FREET4 0.75 02/26/2016   FREET4 0.86 06/25/2015   FREET4 0.79 02/11/2013   Lab Results  Component Value Date   T3FREE 2.4 04/08/2020   T3FREE 2.8 01/16/2019   T3FREE 3.0 11/23/2018   T3FREE 2.8 09/25/2017   T3FREE 3.3 02/26/2016   T3FREE 3.5 06/25/2015   T3FREE 2.7 02/11/2013   His antithyroid antibodies were elevated: Component     Latest Ref Rng & Units 06/25/2015  Thyroperoxidase Ab SerPl-aCnc     <9 IU/mL 75 (H)  Thyroglobulin Ab     <2 IU/mL 4 (H)   He had hip surgery 04/2019.  In summer 2017, they lost their second baby at 24 weeks (hydrops), after wife contracted CMV virus.  In July 2019, his wife gave birth to his second daughter.  ROS: Constitutional: no weight gain/no weight loss, no fatigue, no subjective hyperthermia, no subjective hypothermia Eyes: no blurry vision, no xerophthalmia ENT: no sore throat, + see HPI Cardiovascular: no CP/no SOB/no palpitations/no leg swelling Respiratory: no cough/no SOB/no wheezing Gastrointestinal: no N/no V/no D/no C/no acid reflux Musculoskeletal: no muscle aches/no joint aches Skin: no rashes, no hair loss Neurological: no tremors/no numbness/no tingling/no dizziness  I reviewed pt's medications, allergies, PMH, social hx, family hx, and changes were documented in the history of present illness. Otherwise, unchanged from my initial visit note.  Past Medical History:  Diagnosis Date  . Chicken pox   . Depression   . Diabetes mellitus without complication (HCC)   . Eating disorder    Past Surgical History:  Procedure Laterality Date  . APPENDECTOMY    . LASIK     2013 or 2014  . WISDOM TOOTH EXTRACTION     Social History    Socioeconomic History  . Marital status: Married    Spouse name: Not on file  . Number of children: Not on file  . Years of education: Not on file  . Highest education level: Not on file  Occupational History  . Occupation: Passenger transport managerurse Practitioner    Employer: [employer]  Tobacco Use  . Smoking status: Never Smoker  . Smokeless tobacco: Never Used  Vaping Use  . Vaping Use: Never used  Substance and Sexual Activity  . Alcohol use: No  .  Drug use: No  . Sexual activity: Yes  Other Topics Concern  . Not on file  Social History Narrative   Married. Daughter 71 years old and Kenard Gower 38 year old in 2020. One child passed early -after birth.       FNP endocrinology   App undergrad- graduated [employer]. Masters at [employer].       Regular exercise: yes, biking, run, weight lifting- back to crossfit, mountain biking- slow on this compared to previous with neck      Caffeine use: daily   Social Determinants of Health   Financial Resource Strain:   . Difficulty of Paying Living Expenses: Not on file  Food Insecurity:   . Worried About Programme researcher, broadcasting/film/video in the Last Year: Not on file  . Ran Out of Food in the Last Year: Not on file  Transportation Needs:   . Lack of Transportation (Medical): Not on file  . Lack of Transportation (Non-Medical): Not on file  Physical Activity:   . Days of Exercise per Week: Not on file  . Minutes of Exercise per Session: Not on file  Stress:   . Feeling of Stress : Not on file  Social Connections:   . Frequency of Communication with Friends and Family: Not on file  . Frequency of Social Gatherings with Friends and Family: Not on file  . Attends Religious Services: Not on file  . Active Member of Clubs or Organizations: Not on file  . Attends Banker Meetings: Not on file  . Marital Status: Not on file  Intimate Partner Violence:   . Fear of Current or Ex-Partner: Not on file  . Emotionally Abused: Not on file  . Physically Abused: Not on file   . Sexually Abused: Not on file   Current Outpatient Medications on File Prior to Visit  Medication Sig Dispense Refill  . CONTOUR NEXT TEST test strip USE TO TEST BLOOD SUGAR UP TO 10 TIMES A DAY AS INSTRUCTED 300 each 11  . escitalopram (LEXAPRO) 20 MG tablet Take 1 tablet (20 mg total) by mouth daily. 90 tablet 3  . hydrocortisone-pramoxine (ANALPRAM-HC) 2.5-1 % rectal cream Place 1 application rectally 3 (three) times daily. For up to 7 days 30 g 0  . Ibuprofen-Famotidine (DUEXIS) 800-26.6 MG TABS Take 1 tablet by mouth 3 (three) times daily as needed. 1 tab po tid X 14 days then 1 tab po tid as needed 90 tablet 2  . insulin degludec (TRESIBA FLEXTOUCH) 100 UNIT/ML SOPN FlexTouch Pen Inject 0.2-0.26 mLs (20-26 Units total) into the skin daily. 5 pen 5  . insulin lispro (HUMALOG) 100 UNIT/ML injection INJECT UP TO 75 UNITS INTO PUMP DAILY AS ADVISED 30 mL 5  . Insulin Pen Needle (BD PEN NEEDLE NANO U/F) 32G X 4 MM MISC Use 4x a day 200 each 11   No current facility-administered medications on file prior to visit.   Allergies  Allergen Reactions  . Penicillins Rash   Family History  Problem Relation Age of Onset  . Thyroid disease Mother   . Hyperlipidemia Mother   . Depression Mother   . GER disease Mother   . Hypertension Mother   . Other Mother        prediabetes  . Hyperlipidemia Father   . Alcohol abuse Maternal Grandmother   . Diabetes Maternal Grandmother   . Cancer Maternal Grandfather        colon and prostate. age 24 in 2018  . Stroke Maternal  Grandfather        in 90s  . Dementia Maternal Grandfather        vascular  . Healthy Sister    PE: BP 138/78   Pulse 67   Ht  (1.753 m)   Wt 175 lb 9.6 oz (79.7 kg)   SpO2 97%   BMI 25.93 kg/m  Body mass index is 25.93 kg/m. Wt Readings from Last 3 Encounters:  04/16/20 175 lb 9.6 oz (79.7 kg)  11/29/18 174 lb 9.6 oz (79.2 kg)  11/23/18 172 lb (78 kg)   Constitutional: normal weight, in NAD Eyes: PERRLA,  EOMI, no exophthalmos ENT: moist mucous membranes, no thyromegaly, no cervical lymphadenopathy Cardiovascular: RRR, No MRG Respiratory: CTA B Gastrointestinal: abdomen soft, NT, ND, BS+ Musculoskeletal: no deformities, strength intact in all 4 Skin: moist, warm, no rashes Neurological: no tremor with outstretched hands, DTR normal in all 4  ASSESSMENT: 1. DM1, controlled, without complications except hypoglycemia  2. Hashimoto thyroiditis -Euthyroid  PLAN:  1. Patient with longstanding, well-controlled, type 1 diabetes, with history of hypoglycemic episodes, improved with only mild lows on his t:slim insulin pump.  He also has an integrated Dexcom G6 CGM.  He is in the control IQ closed-loop mode.  She is happy with the current system.  He usually uses the control IQ system in the sleep mode, and this allows him to stay within the 110 and 120 range and does not provide supplemental boluses compared to the regular control IQ, and gives him more stable control -He is very knowledgeable about type 1 diabetes management and continues to do a great job adjusting the pump settings in response to his patterns.  His active insulin time is 2.5 hours, which appears to be appropriate for him.  He is trying to bolus 15 minutes before every meal.  At last visit, his control was excellent with only occasional low blood sugars in the 70s during the night, and occasional higher blood sugars after certain meals.  He noticed that while working outside in the yard, he had mild lows in the 60s, eventually disconnecting the pump.  However, he was caring snacks with him.  Also he occasionally had a blood sugar peak in the morning, not necessarily after breakfast, but occasionally after exercise.  We discussed about being careful how he corrected for these mild hyperglycemic peaks but we did not change his regimen at that time.  HbA1c was 5.7%.  He had a more recent HbA1c of 6.4% (higher). -We checked his annual labs  approximately a week ago and these were all at goal with the exception of a slightly low glucose in the 60s. CGM interpretation: -At today's visit, we reviewed her CGM downloads: It appears that 78% of values are in target range, while 19% are higher than 180, and 4% are lower than 70.  The calculated average blood sugar is 142.  -Reviewing the CGM trends, sugars appear to be well controlled overnight and they do increase slightly after breakfast, but usually staying in the target range.  However, they are more variable and usually increase more after lunch mostly due to the quality of the lunch, not always being able to inject 15 minutes before the meal, and also the fact that he would like to avoid low blood sugars while at work.  To help with this, I did suggest to try to switch to Lyumjev instead of Humalog.  She will try this and let me know if you want to  continue with it. -His sugars then decrease before 5 PM when he starts exercise.  He usually gets a 15 g of carb snack and it appears that sugars to increase afterwards, many of these values being above target, and in the 200s.  He tried to avoid drops in blood sugars before exercise by changing to the exercise mode of the pump with a temporary higher basal rate, but this did not help.  She also try to lower the basal rate in the afternoon, but this did not help since he is mostly staying the automatic mode.  I believe helping with post lunch hyperglycemia by switching to Lyumjev will help with the lower blood sugars before exercise so he may not need to have a snack before this, thus preventing hyperglycemia after exercise. -His insulin to carb ratios appear accurate.he did have higher blood sugars last week after COVID-19 booster and he actually change his pump settings at that time.  Sugars started to improve so for now we can continue the same settings. -I advised him to: Patient Instructions  Please continue: - Basal rates:   12 am: 0.450, ISF  1:55, ICR 1:10 3 am : 0.800, ISF 1:55, ICR 1:10 7 am: 0.750, ISF 1:40, ICR 1:9 10 am: 0.750, ISF 1:40, ICR 1:9 3 pm: 0.650, ISF 1:40, ICR 1:9 9 pm: 0.600, ISF 1:55, ICR 1:10 - target CBG: 110 - Active insulin time: 2.5h  Try to change to Lyumjev.  Please return in 6 months.  - advised to check sugars at different times of the day - 4x a day, rotating check times - advised for yearly eye exams >> he is UTD - return to clinic in 6 months  2. Hashimoto's thyroiditis -No signs or symptoms of hypothyroidism -Reviewed his most recent TFTs and these were normal earlier this month   Carlus Pavlov, MD PhD [facility] Endocrinology

## 2020-04-22 ENCOUNTER — Other Ambulatory Visit: Payer: Self-pay | Admitting: Family Medicine

## 2020-04-22 ENCOUNTER — Other Ambulatory Visit: Payer: Self-pay | Admitting: Internal Medicine

## 2020-04-22 DIAGNOSIS — E109 Type 1 diabetes mellitus without complications: Secondary | ICD-10-CM

## 2020-04-22 MED FILL — ESCITALOPRAM 20 MG TABLET: 20 | 90 days supply | Qty: 90 | Fill #0

## 2020-04-22 NOTE — Telephone Encounter (Signed)
Patient's insurance not covering original Rx. Alternatives are Kwikpen 100 unit/ML [place] SOPN; Humalog 100 unit/ML [place] SOCT; or Lyumjev Kwikpen 100 unit/ML [place] SOPN.

## 2020-04-23 ENCOUNTER — Telehealth: Payer: Self-pay | Admitting: Internal Medicine

## 2020-04-23 ENCOUNTER — Other Ambulatory Visit (HOSPITAL_COMMUNITY): Payer: Self-pay | Admitting: Internal Medicine

## 2020-04-23 MED FILL — HumaLOG 100 UNIT/ML SOLN: 100 | 40 days supply | Qty: 30 | Fill #0

## 2020-04-23 NOTE — Telephone Encounter (Signed)
Called and advised Rx was refilled to preferred pharmacy.

## 2020-04-23 NOTE — Telephone Encounter (Signed)
Joni Reining (wife) called for the patient stating the patient only wants his humalog refilled.    [phone] nicole

## 2020-04-23 NOTE — Telephone Encounter (Signed)
T, Ok to send this but please make sure he does not want Lyumjev fist. I d/w him at last OV about potentially using this.

## 2020-04-24 ENCOUNTER — Ambulatory Visit: Payer: No Typology Code available for payment source | Admitting: Internal Medicine

## 2020-05-04 ENCOUNTER — Other Ambulatory Visit: Payer: Self-pay | Admitting: Family Medicine

## 2020-05-04 ENCOUNTER — Encounter: Payer: Self-pay | Admitting: Family Medicine

## 2020-05-04 MED ORDER — CEPHALEXIN 500 MG PO CAPS: 500.0000 mg | ORAL_CAPSULE | Freq: Two times a day (BID) | ORAL | 0 refills | Status: DC

## 2020-05-04 MED ORDER — CEPHALEXIN 500 MG PO CAPS: 500.0000 mg | ORAL_CAPSULE | Freq: Two times a day (BID) | ORAL | 0 refills | Status: AC

## 2020-06-11 MED FILL — HumaLOG 100 UNIT/ML SOLN: 100 | 40 days supply | Qty: 30 | Fill #1

## 2020-07-21 MED FILL — HumaLOG 100 UNIT/ML SOLN: 100 | 40 days supply | Qty: 30 | Fill #2

## 2020-07-21 MED FILL — ESCITALOPRAM 20 MG TABLET: 20 | 90 days supply | Qty: 90 | Fill #1

## 2020-07-27 ENCOUNTER — Encounter: Payer: Self-pay | Admitting: Family Medicine

## 2020-07-29 ENCOUNTER — Other Ambulatory Visit: Payer: Self-pay

## 2020-07-29 DIAGNOSIS — G4700 Insomnia, unspecified: Secondary | ICD-10-CM

## 2020-08-31 ENCOUNTER — Other Ambulatory Visit: Payer: Self-pay | Admitting: Internal Medicine

## 2020-08-31 ENCOUNTER — Other Ambulatory Visit (HOSPITAL_COMMUNITY): Payer: Self-pay

## 2020-08-31 MED ORDER — INSULIN LISPRO 100 UNIT/ML ~~LOC~~ SOLN
75.0000 [IU] | Freq: Every day | SUBCUTANEOUS | 2 refills | Status: DC
  Filled 2020-08-31: qty 30, 40d supply, fill #0

## 2020-09-01 ENCOUNTER — Ambulatory Visit (INDEPENDENT_AMBULATORY_CARE_PROVIDER_SITE_OTHER): Payer: No Typology Code available for payment source | Admitting: Neurology

## 2020-09-01 ENCOUNTER — Encounter: Payer: Self-pay | Admitting: Neurology

## 2020-09-01 VITALS — BP 112/74 | HR 66 | Ht 69.0 in | Wt 174.0 lb

## 2020-09-01 DIAGNOSIS — F5104 Psychophysiologic insomnia: Secondary | ICD-10-CM | POA: Insufficient documentation

## 2020-09-01 DIAGNOSIS — E063 Autoimmune thyroiditis: Secondary | ICD-10-CM

## 2020-09-01 DIAGNOSIS — E109 Type 1 diabetes mellitus without complications: Secondary | ICD-10-CM

## 2020-09-01 DIAGNOSIS — R5382 Chronic fatigue, unspecified: Secondary | ICD-10-CM

## 2020-09-01 NOTE — Progress Notes (Signed)
SLEEP MEDICINE CLINIC    Provider:  Melvyn Novas, MD  Primary Care Physician:  Mark Majestic, MD [address] [place] [address]     Referring Provider: Shelva Majestic, Md [address] [place],  [place] [address]          Chief Complaint according to patient   Patient presents with:    . 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       HISTORY OF PRESENT ILLNESS:  Mark Barr is a 35 - year- old Caucasian male patient seen here upon a sleep consultation  -referral on 09/01/2020 from Mark Barr.   Chief concern according to patient :  "My wife wants me to be seen for insomnia and fragmented sleep.My mind starts racing"  In college I took [place], not as effective , then I tried Ambien and had sleep walking, and my psychologist recommended CBD, and I slept wonderful for 2 weeks but became afraid I could test positive for drugs".   Mark Barr  has a past medical history of Chicken pox, Depression, Diabetes mellitus  Type 1 with insulin pump, without complication (HCC), Hashimotos , and Eating disorder in his tweens, .   Insomnia since college 09/04/07- worse since the night he was woken at 2 AM to tell his wife that her mother had committed suicide.  He also was insomniac for a long time after his young son died in 09-04-2015.  My wife went through counseling, but I wake still up at that time.    Sleep relevant medical history: Sleep walking only on Ambien, Insomnia, no Tonsillectomy, multiple concussions.  Left cheek bone fracture, no septal deviation.     Family medical /sleep history: father with  untreated OSA, no  insomnia, no sleep walkers.    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   The patient currently works now in daytime but takes call- used to work in  shifts( Chief Technology Officer,) 2 dogs  are present. Tobacco use: never .   ETOH use: none, Caffeine intake in form of Coffee( 1 cup in AM ) Soda( 1 can at 5 -6 Pm ) Tea ( / or energy drinks. Regular exercise  Daily at 5 AM.      Sleep habits are as follows: The patient's dinner time is between 6 PM. The patient goes to bed at 9.30 PM and continues to struggle to sleep - bedroom is cool, quite and dark, white noise.  Its taken two hours to sleep without meds-, wakes  the first time at 1-2 AM, and again 3-4 AM On weekend he will sleep up to 8 AM.    The preferred sleep position is prone , with the support of 1 pillow.  Dreams are reportedly rare/ frequent/vivid.  5 AM is the usual rise time. The patient wakes up with an alarm.  He  reports not feeling refreshed or restored in AM, with symptoms residual fatigue.  Naps are taken rarely - sometimes after lunch, lasting from 15 minutes and are not more refreshing than nocturnal sleep.    Review of Systems: Out of a complete 14 system review, the patient complains of only the following symptoms, and all other reviewed systems are negative.:  Fatigue, , snoring- yes , fragmented sleep, Insomnia- for years    How likely are you to doze in the following situations: 0 = not likely, 1 = slight chance, 2 = moderate chance, 3 = high chance   Sitting and Reading? Watching Television? Sitting inactive in a public place (theater or meeting)? As a passenger in a car for an hour without a break? Lying down in the afternoon when circumstances permit? Sitting and talking to someone? Sitting quietly after lunch without alcohol? In a car, while stopped for a few minutes in traffic?   Total = 5/ 24 points   FSS endorsed at 23/ 63 points.   Social History   Socioeconomic History  . Marital status: Married    Spouse name: Not on file  . Number of children: Not on file  . Years of education: Not on file  . Highest education level: Not on file  Occupational  History  . Occupation: Passenger transport manager:   Tobacco Use  . Smoking status: Never Smoker  . Smokeless tobacco: Never Used  Vaping Use  . Vaping Use: Never used  Substance and Sexual Activity  . Alcohol use: No  . Drug use: No  . Sexual activity: Yes  Other Topics Concern  . Not on file  Social History Narrative   Married. Daughter 24 years old and Mark Barr 66 year old in 2020. One child passed early -after birth.       FNP endocrinology   App undergrad- graduated [employer]. Masters at [employer].       Regular exercise: yes, biking, run, weight lifting- back to crossfit, mountain biking- slow on this compared to previous with neck      Caffeine use: daily   Social Determinants of Health   Financial Resource Strain: Not on file  Food Insecurity: Not on file  Transportation Needs: Not on file  Physical Activity: Not on file  Stress: Not on file  Social Connections: Not on file    Family History  Problem Relation Age of Onset  . Thyroid disease Mother   . Hyperlipidemia Mother   . Depression Mother   . GER disease Mother   . Hypertension Mother   . Other Mother        prediabetes  . Hyperlipidemia Father   . Alcohol abuse Maternal Grandmother   . Diabetes Maternal Grandmother   . Cancer Maternal Grandfather        colon and prostate. age 60 in 2018  . Stroke Maternal Grandfather        in 90s  . Dementia Maternal Grandfather        vascular  . Healthy  Son - Hydrops CMV related - Sister     Past Medical History:  Diagnosis Date  . Chicken pox   . Depression   . Diabetes mellitus without complication (HCC)   . Eating disorder     Past Surgical History:  Procedure Laterality Date  . APPENDECTOMY    . LASIK     2013 or 2014  . WISDOM TOOTH EXTRACTION       Current Outpatient Medications on File Prior to Visit  Medication Sig Dispense Refill  . CONTOUR NEXT TEST test strip USE TO TEST BLOOD SUGAR UP TO 10 TIMES A DAY AS INSTRUCTED 300 each 11   . COVID-19 mRNA vaccine, Pfizer, 30 MCG/0.3ML injection USE AS DIRECTED .3 mL 0  . escitalopram (LEXAPRO) 20 MG tablet TAKE 1 TABLET (20 MG  TOTAL) BY MOUTH DAILY. 90 tablet 3  . Glucagon 3 MG/DOSE POWD USE AS NEEDED FOR HYPOGLYCEMIA 1 each 11  . insulin degludec (TRESIBA FLEXTOUCH) 100 UNIT/ML SOPN FlexTouch Pen Inject 0.2-0.26 mLs (20-26 Units total) into the skin daily. 5 pen 5  . insulin lispro (HUMALOG) 100 UNIT/ML cartridge Inject up to 75 units into pump daily as advised. 30 mL 2  . insulin lispro (HUMALOG) 100 UNIT/ML injection INJECT UP TO 75 UNITS INTO PUMP DAILY AS ADVISED. 30 mL 2  . Insulin Pen Needle (BD PEN NEEDLE NANO U/F) 32G X 4 MM MISC Use 4x a day 200 each 11   No current facility-administered medications on file prior to visit.    Allergies  Allergen Reactions  . Penicillins Rash    Physical exam:  Today's Vitals   09/01/20 1308  BP: 112/74  Pulse: 66  Weight: 174 lb (78.9 kg)  Height: 5\' 9"  (1.753 m)   Body mass index is 25.7 kg/m.   Wt Readings from Last 3 Encounters:  09/01/20 174 lb (78.9 kg)  04/16/20 175 lb 9.6 oz (79.7 kg)  11/29/18 174 lb 9.6 oz (79.2 kg)     Ht Readings from Last 3 Encounters:  09/01/20 5\' 9"  (1.753 m)  04/16/20 5\' 9"  (1.753 m)  08/06/19 5\' 9"  (1.753 m)        General: The patient is awake, alert and appears not in acute distress. The patient is well groomed. Head: Normocephalic, atraumatic. Neck is supple.  Mallampati  1,  neck circumference: 15 inches .  Nasal airflow  patent.  Retrognathia is not seen.  Dental status: biological  Cardiovascular:  Regular rate and cardiac rhythm by pulse,  without distended neck veins. Respiratory: Lungs are clear to auscultation.  Skin:  Without evidence of ankle edema, or rash. Trunk: The patient's posture is erect.   Neurologic exam : The patient is awake and alert, oriented to place and time.   Memory subjective described as intact.  Attention span & concentration ability  appears normal.  Speech is fluent,  without  dysarthria, dysphonia or aphasia.  Mood and affect are appropriate.   Cranial nerves: no loss of smell or taste reported  Pupils are equal and briskly reactive to light. Funduscopic exam deferred.   Extraocular movements in vertical and horizontal planes were intact and without nystagmus. No Diplopia. Visual fields by finger perimetry are intact. Hearing was intact to soft voice and finger rubbing. Facial sensation intact to fine touch. Facial motor strength is symmetric and tongue and uvula move midline.  Neck ROM : rotation, tilt and flexion extension were normal for age and shoulder shrug was symmetrical.    Motor exam:  Symmetric bulk, tone and ROM.   Normal tone without cog- wheeling, symmetric grip strength . Sensory:  Fine touch,  and vibration were normal. No neuropathy.  Proprioception tested in the upper extremities was normal. Coordination: Rapid alternating movements in the fingers/hands were of normal speed.  The Finger-to-nose maneuver was intact without evidence of ataxia, dysmetria or tremor. Gait and station: Patient could rise unassisted from a seated position, walked without assistive device.  Stance is of normal width/ base and the patient turned with 3 steps (RN observation).  Toe and heel walk were deferred.  Deep tendon reflexes: in the  upper and lower extremities are symmetric and intact.  Babinski response was deferred.      After spending a total time of 45  minutes face to face and additional  time for physical and neurologic examination, review of laboratory studies,  personal review of imaging studies, reports and results of other testing and review of referral information / records as far as provided in visit, I have established the following assessments:  1)  Chronic insomnia. Racing thoughts. Some anxiety and depression, may be OCD. Vivid dreams.  2) Sleep was improved under CBD- looking for a similar acting  prescription drug.  3)  Insufficient sleep syndrome with fatigue. 4) remote history of night shift work, had impaired diabetes management under those work hours.     My Plan is to proceed with:  1) We will rule out an organic sleep disorder first , screening for apnea by PSG or HST.   He prefers HST . 2) No hypersomnia.   Will not use trazodone unless restricted to 50 mg or less.  3) CBD gummies with melatonin should be resumed once he has enrolled back in school.   I would like to thank  Mark Majestic, Md [address] [place],  [place] [address] for allowing me to meet with and to take care of this pleasant patient.   In short, Mark Barr is presenting with  chronic insomnia.   I plan to follow up either personally or through our NP within 2-4 month.   CC: I will share my notes with PCP.  Electronically signed by: Mark Novas, MD 09/01/2020 1:12 PM  [facility] Board certified by The [employer] of Sleep Medicine and Diplomate of the [employer] of Sleep Medicine. Board certified In Neurology through the ABPN, Fellow of the [employer] of Neurology. Medical Director of [facility].

## 2020-09-01 NOTE — Patient Instructions (Addendum)
Please remember to try to maintain good sleep hygiene, which means: Keep a regular sleep and wake schedule, try not to exercise or have a meal within 2 hours of your bedtime, try to keep your bedroom conducive for sleep, that is, cool and dark, without light distractors such as an illuminated alarm clock, and refrain from watching TV right before sleep or in the middle of the night and do not keep the TV or radio on during the night. Also, try not to use or play on electronic devices at bedtime, such as your cell phone, tablet PC or laptop. If you like to read at bedtime on an electronic device, try to dim the background light as much as possible. Do not eat in the middle of the night.   We will request a sleep study.   We will look for leg twitching and snoring or sleep apnea.   For chronic insomnia, you are best followed by a psychiatrist and/or sleep psychologist.       Insomnia Insomnia is a sleep disorder that makes it difficult to fall asleep or stay asleep. Insomnia can cause fatigue, low energy, difficulty concentrating, mood swings, and poor performance at work or school. There are three different ways to classify insomnia:  Difficulty falling asleep.  Difficulty staying asleep.  Waking up too early in the morning. Any type of insomnia can be long-term (chronic) or short-term (acute). Both are common. Short-term insomnia usually lasts for three months or less. Chronic insomnia occurs at least three times a week for longer than three months. What are the causes? Insomnia may be caused by another condition, situation, or substance, such as:  Anxiety.  Certain medicines.  Gastroesophageal reflux disease (GERD) or other gastrointestinal conditions.  Asthma or other breathing conditions.  Restless legs syndrome, sleep apnea, or other sleep disorders.  Chronic pain.  Menopause.  Stroke.  Abuse of alcohol, tobacco, or illegal drugs.  Mental health conditions, such as  depression.  Caffeine.  Neurological disorders, such as Alzheimer's disease.  An overactive thyroid (hyperthyroidism). Sometimes, the cause of insomnia may not be known. What increases the risk? Risk factors for insomnia include:  Gender. Women are affected more often than men.  Age. Insomnia is more common as you get older.  Stress.  Lack of exercise.  Irregular work schedule or working night shifts.  Traveling between different time zones.  Certain medical and mental health conditions. What are the signs or symptoms? If you have insomnia, the main symptom is having trouble falling asleep or having trouble staying asleep. This may lead to other symptoms, such as:  Feeling fatigued or having low energy.  Feeling nervous about going to sleep.  Not feeling rested in the morning.  Having trouble concentrating.  Feeling irritable, anxious, or depressed. How is this diagnosed? This condition may be diagnosed based on:  Your symptoms and medical history. Your health care provider may ask about: ? Your sleep habits. ? Any medical conditions you have. ? Your mental health.  A physical exam. How is this treated? Treatment for insomnia depends on the cause. Treatment may focus on treating an underlying condition that is causing insomnia. Treatment may also include:  Medicines to help you sleep.  Counseling or therapy.  Lifestyle adjustments to help you sleep better. Follow these instructions at home: Eating and drinking  Limit or avoid alcohol, caffeinated beverages, and cigarettes, especially close to bedtime. These can disrupt your sleep.  Do not eat a large meal or eat spicy  foods right before bedtime. This can lead to digestive discomfort that can make it hard for you to sleep.   Sleep habits  Keep a sleep diary to help you and your health care provider figure out what could be causing your insomnia. Write down: ? When you sleep. ? When you wake up during the  night. ? How well you sleep. ? How rested you feel the next day. ? Any side effects of medicines you are taking. ? What you eat and drink.  Make your bedroom a dark, comfortable place where it is easy to fall asleep. ? Put up shades or blackout curtains to block light from outside. ? Use a white noise machine to block noise. ? Keep the temperature cool.  Limit screen use before bedtime. This includes: ? Watching TV. ? Using your smartphone, tablet, or computer.  Stick to a routine that includes going to bed and waking up at the same times every day and night. This can help you fall asleep faster. Consider making a quiet activity, such as reading, part of your nighttime routine.  Try to avoid taking naps during the day so that you sleep better at night.  Get out of bed if you are still awake after 15 minutes of trying to sleep. Keep the lights down, but try reading or doing a quiet activity. When you feel sleepy, go back to bed.   General instructions  Take over-the-counter and prescription medicines only as told by your health care provider.  Exercise regularly, as told by your health care provider. Avoid exercise starting several hours before bedtime.  Use relaxation techniques to manage stress. Ask your health care provider to suggest some techniques that may work well for you. These may include: ? Breathing exercises. ? Routines to release muscle tension. ? Visualizing peaceful scenes.  Make sure that you drive carefully. Avoid driving if you feel very sleepy.  Keep all follow-up visits as told by your health care provider. This is important. Contact a health care provider if:  You are tired throughout the day.  You have trouble in your daily routine due to sleepiness.  You continue to have sleep problems, or your sleep problems get worse. Get help right away if:  You have serious thoughts about hurting yourself or someone else. If you ever feel like you may hurt  yourself or others, or have thoughts about taking your own life, get help right away. You can go to your nearest emergency department or call:  Your local emergency services (911 in the [place]  A suicide crisis helpline, such as the National Suicide Prevention Lifeline at (603)079-6816. This is open 24 hours a day. Summary  Insomnia is a sleep disorder that makes it difficult to fall asleep or stay asleep.  Insomnia can be long-term (chronic) or short-term (acute).  Treatment for insomnia depends on the cause. Treatment may focus on treating an underlying condition that is causing insomnia.  Keep a sleep diary to help you and your health care provider figure out what could be causing your insomnia. This information is not intended to replace advice given to you by your health care provider. Make sure you discuss any questions you have with your health care provider. Document Revised: 03/12/2020 Document Reviewed: 03/12/2020 Elsevier Patient Education  2021 [employer].

## 2020-09-08 ENCOUNTER — Telehealth: Payer: Self-pay

## 2020-09-08 NOTE — Telephone Encounter (Signed)
LVM for pt to call me back to schedule sleep study  

## 2020-09-23 ENCOUNTER — Telehealth: Payer: Self-pay

## 2020-09-23 NOTE — Telephone Encounter (Signed)
LVM for pt to call me back to schedule sleep study  

## 2020-09-30 ENCOUNTER — Encounter: Payer: Self-pay | Admitting: Family Medicine

## 2020-10-09 ENCOUNTER — Other Ambulatory Visit (HOSPITAL_COMMUNITY): Payer: Self-pay

## 2020-10-09 MED ORDER — INSULIN LISPRO 100 UNIT/ML IJ SOLN
75.0000 [IU] | Freq: Every day | INTRAMUSCULAR | 1 refills | Status: DC
  Filled 2020-10-09: qty 30, 40d supply, fill #0
  Filled 2020-12-07: qty 30, 40d supply, fill #1

## 2020-10-16 ENCOUNTER — Encounter: Payer: Self-pay | Admitting: Internal Medicine

## 2020-10-16 ENCOUNTER — Ambulatory Visit (INDEPENDENT_AMBULATORY_CARE_PROVIDER_SITE_OTHER): Payer: No Typology Code available for payment source | Admitting: Internal Medicine

## 2020-10-16 ENCOUNTER — Other Ambulatory Visit: Payer: Self-pay

## 2020-10-16 VITALS — BP 120/88 | HR 66 | Ht 69.0 in | Wt 175.2 lb

## 2020-10-16 DIAGNOSIS — E063 Autoimmune thyroiditis: Secondary | ICD-10-CM

## 2020-10-16 DIAGNOSIS — E109 Type 1 diabetes mellitus without complications: Secondary | ICD-10-CM | POA: Diagnosis not present

## 2020-10-16 LAB — POCT GLYCOSYLATED HEMOGLOBIN (HGB A1C): Hemoglobin A1C: 5.7 % — AB (ref 4.0–5.6)

## 2020-10-16 NOTE — Patient Instructions (Signed)
Please continue: - Basal rates:   12 am: 0.450, ISF 1:55, ICR 1:10 3 am : 0.800, ISF 1:55, ICR 1:10 7 am: 0.850, ISF 1:40, ICR 1:9 10 am: 0.850, ISF 1:40, ICR 1:9 (try 1:8.5) 3 pm: 0.800 >> 0.700, ISF 1:40, ICR 1:9 9 pm: 0.650, ISF 1:50, ICR 1:10 - target CBG: 110 - Active insulin time: 2.5h  Try to keep stop the sleep mode midday.  Please return in 6 months.

## 2020-10-16 NOTE — Progress Notes (Signed)
Patient ID: AMEIR FARIA, male   DOB: 29-Mar-1986, 35 y.o.   MRN: [id number]  This visit occurred during the SARS-CoV-2 public health emergency.  Safety protocols were in place, including screening questions prior to the visit, additional usage of staff PPE, and extensive cleaning of exam room while observing appropriate contact time as indicated for disinfecting solutions.   HPI: WINFIELD CABA is a 35 y.o.-year-old male, returning for f/u for DM1, dx 29 (at 35 y/o), controlled, w/o complications, on insulin pump since 2000. Last visit 6 months ago.  Interim history: He continues to experience insomnia.  He may need to have a sleep test, but this is expensive. Otherwise, he has no complaints at this visit: No blurred vision, increased urination, nausea, chest pain.  Insulin pump: - Medtronic Revel 530 G loaner - Omnipod since12/09/2013 >> loved it, however he developed a blistering a rash and the attaching site (started to use Flonase before attaching the pump, which helped) - Medtronic 723  - Medtronic 670G since 10/2015 -T:slim X2 since 03/2017.  He is using the control IQ technology.  He feels that this is a good match for him.  CGM: -Dexcom G6 since 01/2017  Insulin: -He initially tried NovoLog and Fiasp in the pump, but did not see a difference between the 2 in the 670 G pump.   -After he switched to the T slim pump, he felt [place] was working better for him -However, he is now using Humalog per insurance preference -We tried Lyumjev 04/2020 but this caused burning at the infusion site so he stopped  Reviewed HbA1c levels: Lab Results  Component Value Date   HGBA1C 5.7 08/05/2019   HGBA1C 6.4 (A) 11/23/2018   HGBA1C 6.3 04/16/2018   HGBA1C 6.6 (H) 02/26/2016   HGBA1C 6.5 10/01/2015   HGBA1C 7.1 (H) 06/25/2015   HGBA1C 6.8 02/19/2015   HGBA1C 6.5 08/25/2014   HGBA1C 7.2 (H) 04/28/2014   HGBA1C 6.9 (H) 12/24/2013   HGBA1C 6.1 09/16/2013   HGBA1C 6.6 (H) 05/13/2013    HGBA1C 6.9 (H) 02/11/2013  04/10/2018: HbA1c 6.3% 09/15/2017: HbA1c 6.4%  Pump settings:   - Basal rates:   12 am: 0.450, ISF 1:55, ICR 1:10 3 am : 0.800, ISF 1:55, ICR 1:10 7 am: 0.850, ISF 1:40, ICR 1:9 10 am: 0.850, ISF 1:40, ICR 1:9 3 pm: 0.650 >> 0.800, ISF 1:40, ICR 1:9 9 pm: 0.650, ISF 1:50, ICR 1:10 - target CBG: 110 - Active insulin time: 3h >> 2.5h  CGM parameters:  - Average from CGM: 143 >> 136 >> 142 >> 140 for the last 2 weeks  Time in range:  - low (<80): 10% >> 7% >> 4% >> 6% - normal range (80-179): 70% >> 78% >> 78% >> 75% - high sugars (> or =180): 10% >> 15% >> 19 >> 80%  He usually uses the sleep mode throughout the day and the night, with the exceptions.      Previously:   Total daily dose from basal: 20 >> 16 units a day (43%) >> 19 units (45%) Total daily dose from bolus 23 >>> 23 units a day (57%) >> 23 units (54%) Uses 41-60 units a day. - highest CBG:  340 >> 366 >> 369 - lowest CBG: 40 >> 40 >> 43  Pt's meals are: - Breakfast:  2 eggs + bagel + coffee, sometimes cereals or occasionally morning PB + jelly - Lunch: sandwich, fruit (apple), veggies (carrots) and, goldfish >> Pb + jelly +  apple - Dinner: chicken, rice, and veggies - Snacks: 2 snacks: almonds, fruit, or cheese crackers  He is exercising mostly in the morning but occasionally around 5 PM.  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   -No CKD: Lab Results  Component Value Date   BUN 11 04/08/2020   CREATININE 1.04 04/08/2020   -Normal ACR: Lab Results  Component Value Date   MICRALBCREAT 2.4 04/08/2020   MICRALBCREAT NOTE 11/23/2018   MICRALBCREAT 0.9 02/26/2016   MICRALBCREAT 2.4 02/23/2015   MICRALBCREAT 0.5 12/24/2013   MICRALBCREAT 0.3 02/11/2013   -No HL; last set of lipids:  Lab Results  Component Value Date   CHOL 160 04/08/2020   HDL 66.20 04/08/2020   LDLCALC 86 04/08/2020   TRIG 40.0 04/08/2020   CHOLHDL 2 04/08/2020   - last eye  exam was in 01/03/2020: + very mild PDR OU. Dr. Charlotte Sanes - no numbness and tingling in his feet.  Hashimoto thyroiditis: -He did not require levothyroxine  Latest TFTs were normal: Lab Results  Component Value Date   TSH 2.67 04/08/2020   TSH 2.58 01/16/2019   TSH 4.84 (H) 11/23/2018   TSH 3.85 09/25/2017   TSH 1.61 09/27/2016   TSH 2.24 02/26/2016   TSH 2.27 06/25/2015   TSH 4.50 02/23/2015   TSH 1.34 12/24/2013   TSH 2.35 02/11/2013   TSH 1.98 10/10/2012   Lab Results  Component Value Date   FREET4 0.74 04/08/2020   FREET4 1.0 01/16/2019   FREET4 0.9 11/23/2018   FREET4 0.9 09/25/2017   FREET4 0.78 09/27/2016   FREET4 0.75 02/26/2016   FREET4 0.86 06/25/2015   FREET4 0.79 02/11/2013   Lab Results  Component Value Date   T3FREE 2.4 04/08/2020   T3FREE 2.8 01/16/2019   T3FREE 3.0 11/23/2018   T3FREE 2.8 09/25/2017   T3FREE 3.3 02/26/2016   T3FREE 3.5 06/25/2015   T3FREE 2.7 02/11/2013   His antithyroid antibodies were elevated: Component     Latest Ref Rng & Units 06/25/2015  Thyroperoxidase Ab SerPl-aCnc     <9 IU/mL 75 (H)  Thyroglobulin Ab     <2 IU/mL 4 (H)   He had hip surgery 04/2019.  In summer 2017, they lost their second baby at 24 weeks (hydrops), after wife contracted CMV virus.  In July 2019, his wife gave birth to his second daughter.  ROS: Constitutional: no weight gain/no weight loss, no fatigue, no subjective hyperthermia, no subjective hypothermia Eyes: no blurry vision, no xerophthalmia ENT: no sore throat, + see HPI Cardiovascular: no CP/no SOB/no palpitations/no leg swelling Respiratory: no cough/no SOB/no wheezing Gastrointestinal: no N/no V/no D/no C/no acid reflux Musculoskeletal: no muscle aches/no joint aches Skin: no rashes, no hair loss Neurological: no tremors/no numbness/no tingling/no dizziness  I reviewed pt's medications, allergies, PMH, social hx, family hx, and changes were documented in the history of present illness.  Otherwise, unchanged from my initial visit note.  Past Medical History:  Diagnosis Date  . Chicken pox   . Depression   . Diabetes mellitus without complication (HCC)   . Eating disorder    Past Surgical History:  Procedure Laterality Date  . APPENDECTOMY    . LASIK     2013 or 2014  . WISDOM TOOTH EXTRACTION     Social History   Socioeconomic History  . Marital status: Married    Spouse name: Not on file  . Number of children: Not on file  . Years of education:  Not on file  . Highest education level: Not on file  Occupational History  . Occupation: Passenger transport manager: [employer]  Tobacco Use  . Smoking status: Never Smoker  . Smokeless tobacco: Never Used  Vaping Use  . Vaping Use: Never used  Substance and Sexual Activity  . Alcohol use: No  . Drug use: No  . Sexual activity: Yes  Other Topics Concern  . Not on file  Social History Narrative   Married. Daughter 58 years old and Kenard Gower 27 year old in 2020. One child passed early -after birth.       FNP endocrinology   App undergrad- graduated [employer]. Masters at [employer].       Regular exercise: yes, biking, run, weight lifting- back to crossfit, mountain biking- slow on this compared to previous with neck      Caffeine use: daily   Social Determinants of Health   Financial Resource Strain: Not on file  Food Insecurity: Not on file  Transportation Needs: Not on file  Physical Activity: Not on file  Stress: Not on file  Social Connections: Not on file  Intimate Partner Violence: Not on file   Current Outpatient Medications on File Prior to Visit  Medication Sig Dispense Refill  . CONTOUR NEXT TEST test strip USE TO TEST BLOOD SUGAR UP TO 10 TIMES A DAY AS INSTRUCTED 300 each 11  . COVID-19 mRNA vaccine, Pfizer, 30 MCG/0.3ML injection USE AS DIRECTED .3 mL 0  . escitalopram (LEXAPRO) 20 MG tablet TAKE 1 TABLET (20 MG TOTAL) BY MOUTH DAILY. 90 tablet 3  . Glucagon 3 MG/DOSE POWD USE AS NEEDED FOR  HYPOGLYCEMIA 1 each 11  . insulin degludec (TRESIBA FLEXTOUCH) 100 UNIT/ML SOPN FlexTouch Pen Inject 0.2-0.26 mLs (20-26 Units total) into the skin daily. 5 pen 5  . insulin lispro (HUMALOG) 100 UNIT/ML cartridge Inject up to 75 units into pump daily as advised. 30 mL 2  . insulin lispro (HUMALOG) 100 UNIT/ML injection Inject up to 75 units into the pump daily as advised 30 mL 1  . Insulin Pen Needle (BD PEN NEEDLE NANO U/F) 32G X 4 MM MISC Use 4x a day 200 each 11   No current facility-administered medications on file prior to visit.   Allergies  Allergen Reactions  . Penicillins Rash   Family History  Problem Relation Age of Onset  . Thyroid disease Mother   . Hyperlipidemia Mother   . Depression Mother   . GER disease Mother   . Hypertension Mother   . Other Mother        prediabetes  . Hyperlipidemia Father   . Alcohol abuse Maternal Grandmother   . Diabetes Maternal Grandmother   . Cancer Maternal Grandfather        colon and prostate. age 82 in 2018  . Stroke Maternal Grandfather        in 90s  . Dementia Maternal Grandfather        vascular  . Healthy Sister    PE: BP 120/88 (BP Location: Right Arm, Patient Position: Sitting, Cuff Size: Normal)   Pulse 66   Ht 5\' 9"  (1.753 m)   Wt 175 lb 3.2 oz (79.5 kg)   SpO2 97%   BMI 25.87 kg/m  Body mass index is 25.87 kg/m. Wt Readings from Last 3 Encounters:  10/16/20 175 lb 3.2 oz (79.5 kg)  09/01/20 174 lb (78.9 kg)  04/16/20 175 lb 9.6 oz (79.7 kg)   Constitutional:  normal weight, in NAD Eyes: PERRLA, EOMI, no exophthalmos ENT: moist mucous membranes, no thyromegaly, no cervical lymphadenopathy Cardiovascular: RRR, No MRG Respiratory: CTA B Gastrointestinal: abdomen soft, NT, ND, BS+ Musculoskeletal: no deformities, strength intact in all 4 Skin: moist, warm, no rashes Neurological: no tremor with outstretched hands, DTR normal in all 4  ASSESSMENT: 1. DM1, controlled, without complications except  hypoglycemia  2. Hashimoto thyroiditis -Euthyroid  PLAN:  1. Patient with longstanding, well-controlled, type 1 diabetes, with history of hypoglycemic episodes, now improved, with only mild lows.  He is on a t:slim insulin pump with integrated Dexcom G6 CGM.  He is in the control IQ closed-loop mode.  He is happy with the current system.  He is usually using the sleep mode throughout the day to allow him to stay within a narrower range: 110-120.  This does not provide him supplemental boluses compared to the regular control IQ.  He is very knowledgeable about type 1 diabetes management and continues to do a great job adjusting the pump settings in response to his glucose patterns. -At last visit, HbA1c was slightly higher, at 6.4%, however, he had another HbA1c 3 months ago and this decreased back to his baseline HbA1c of 5.7% -At last visit I suggested to change from Humalog to Lyumjev, which she tried, but developed burning at the infusion site and is now back on NovoLog.  Sugars were well controlled overnight at that time but they were increasing slightly after breakfast, still usually in target range.  They were more variable and higher after lunch, mostly due to the quality of the lunch and not always being able to inject 15 minutes before the meal.  Also, he usually wants to avoid low blood sugars while at work.  His sugars were decreasing before 5 PM when he started exercise.  He was getting a 15 g of carb snack and sugars were higher afterwards, some of these blood sugars in the 200s.  We did discuss that alleviating postlunch hyperglycemia may improve this. CGM interpretation: -At today's visit, we reviewed his CGM downloads: It appears that 75% of values are in target range (goal >70%) despite the fact that he had higher blood sugars recently due to an upper respiratory infection and then gastroenteritis, while 80% are higher than 180 (goal <25%), and 6% are lower than 70 (goal <4%).  The  calculated average blood sugar is 140.   -Reviewing the CGM trends, his sugars remain fairly well controlled in the last 2 weeks the fact that he mentions that sugars have been higher in this interval.  He did see blood sugars after's, which are unusual for him.  Reviewing his downloads, it appears that his sugars mostly increase after lunch and also less frequently after dinner.  Upon questioning, he is having a peanut butter and jelly sandwich for lunch and we discussed about trying to change bread and maybe eliminate the jelly.  I also suggested a low calorie low-carb crisp bread instead of regular bread. He is trying to eat more calories to build muscle.  We also discussed that he may want to try a stricter insulin to carb ratio: 1:8.5 (he tried 1:8 but this was conducive to hypoglycemia).  Another beneficial change that  he occasional does is to get out of the sleep mode from breakfast after lunch, which usually helps.  He is mostly staying in the sleep mode otherwise.  I advised him to continue with this practice since this allows him to receive  correction boluses if sugars start to increase after meals. -He is exercising 2 hours a day, mostly in the morning, but occasionally after work.  Whenever he does still, he feels that he will may drop his blood sugars immediately after exercise and also occasionally after dinner.  Reviewing his basal rates, he increased his basal rate from 3 PM to 9 PM since last visit and he feels that this may contribute to the low blood sugars with exercise.  We discussed about starting to decrease this basal rate.  -No other changes are needed in his regimen for now. -We did discuss about the possibility of trying the OmniPod 5, but as of now, he is not sure how the algorithm will protect him for hyperglycemia and so he would like to continue with the t:slim pump -I advised him to: Patient Instructions  Please continue: - Basal rates:   12 am: 0.450, ISF 1:55, ICR 1:10 3  am : 0.800, ISF 1:55, ICR 1:10 7 am: 0.850, ISF 1:40, ICR 1:9 10 am: 0.850, ISF 1:40, ICR 1:9 (try 1:8.5) 3 pm: 0.800 >> 0.700, ISF 1:40, ICR 1:9 9 pm: 0.650, ISF 1:50, ICR 1:10 - target CBG: 110 - Active insulin time: 2.5h  Try to stop the sleep mode midday.  Please return in 6 months.  - we checked his HbA1c: 5.7% (stable) - advised to check sugars at different times of the day - 4x a day, rotating check times - advised for yearly eye exams >> he is UTD - Reviewed previous annual labs and will repeat these at next visit - return to clinic in 6 months  2. Hashimoto's thyroiditis -No signs or symptoms of hypothyroidism -Most recent TFTs were normal in 03/2020 -We will repeat these at next visit  Total time spent for the visit: 40 min, in reviewing his pump downloads, discussing his hypo- and hyper-glycemic episodes, reviewing previous labs and pump settings and developing a plan to avoid hypo- and hyper-glycemia.   Carlus Pavlovristina Mathis Cashman, MD PhD [facility]

## 2020-10-30 ENCOUNTER — Telehealth: Payer: Self-pay | Admitting: Internal Medicine

## 2020-10-30 NOTE — Telephone Encounter (Signed)
Sammy from [facility] is calling needing office visit notes and LMN form  Fax was sent over on June 8th and June 13th.   Fax number to [facility]- [phone]

## 2020-11-02 ENCOUNTER — Encounter: Payer: Self-pay | Admitting: Family Medicine

## 2020-11-02 NOTE — Telephone Encounter (Signed)
Last office note faxed to [facility] on 11/02/20

## 2020-11-02 NOTE — Telephone Encounter (Signed)
Contacted patient advised it would be best to contact the insurance to verify if they will cover the physical virtually.

## 2020-11-05 ENCOUNTER — Telehealth (INDEPENDENT_AMBULATORY_CARE_PROVIDER_SITE_OTHER): Payer: No Typology Code available for payment source | Admitting: Family Medicine

## 2020-11-05 ENCOUNTER — Other Ambulatory Visit (HOSPITAL_COMMUNITY): Payer: Self-pay

## 2020-11-05 ENCOUNTER — Encounter: Payer: Self-pay | Admitting: Internal Medicine

## 2020-11-05 ENCOUNTER — Encounter: Payer: Self-pay | Admitting: Family Medicine

## 2020-11-05 VITALS — HR 63

## 2020-11-05 DIAGNOSIS — Z0000 Encounter for general adult medical examination without abnormal findings: Secondary | ICD-10-CM

## 2020-11-05 MED ORDER — ESCITALOPRAM OXALATE 5 MG PO TABS
5.0000 mg | ORAL_TABLET | Freq: Every day | ORAL | 5 refills | Status: DC
  Filled 2020-11-05: qty 30, 30d supply, fill #0

## 2020-11-05 NOTE — Progress Notes (Signed)
Phone [phone] Virtual visit via Video note   Subjective:  Chief complaint: Chief Complaint  Patient presents with   Annual Exam    Patient currently has covid.     This visit type was conducted due to national recommendations for restrictions regarding the COVID-19 Pandemic (e.g. social distancing).  This format is felt to be most appropriate for this patient at this time balancing risks to patient and risks to population by having him in for in person visit.  No physical exam was performed (except for noted visual exam or audio findings with Telehealth visits).    Our team/I connected with Mark Barr at  1:00 PM EDT by a video enabled telemedicine application (doxy.me or caregility through epic) and verified that I am speaking with the correct person using two identifiers.  Location patient: [place] Location provider: [facility], office Persons participating in the virtual visit:  patient  Our team/I discussed the limitations of evaluation and management by telemedicine and the availability of in person appointments. In light of current covid-19 pandemic, patient also understands that we are trying to protect them by minimizing in office contact if at all possible.  The patient expressed consent for telemedicine visit and agreed to proceed. Patient understands insurance will be billed.   See problem oriented charting- Review of Systems  Constitutional:  Positive for chills (from covid) and fever.  HENT:  Negative for ear discharge, ear pain and hearing loss.   Eyes:  Negative for blurred vision and double vision.  Respiratory:  Positive for cough. Negative for hemoptysis.   Cardiovascular:  Negative for chest pain and palpitations.  Gastrointestinal:  Negative for heartburn and nausea.  Genitourinary:  Negative for dysuria and frequency.  Musculoskeletal:  Negative for myalgias and neck pain.  Skin:  Negative for itching and rash.  Neurological:  Positive for dizziness  (with coming off lexapro). Negative for tremors.  Endo/Heme/Allergies:  Negative for polydipsia. Does not bruise/bleed easily.  Psychiatric/Behavioral:  Negative for depression and suicidal ideas.    The following were reviewed and entered/updated in epic: Past Medical History:  Diagnosis Date   Chicken pox    Depression    Diabetes mellitus without complication (HCC)    Eating disorder    Patient Active Problem List   Diagnosis Date Noted   Diabetes type 1, controlled (HCC) 01/07/2013    Priority: High   Hashimoto's thyroiditis 06/26/2015    Priority: Medium   Anxiety and depression 01/07/2013    Priority: Medium   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    Priority: Low   GERD (gastroesophageal reflux disease) 01/07/2013    Priority: Low   History of anorexia nervosa 01/07/2013    Priority: Low   Chronic insomnia 09/01/2020   Other specified joint disorders, left hip 03/22/2019   Past Surgical History:  Procedure Laterality Date   APPENDECTOMY     LASIK     2013 or 2014   WISDOM TOOTH EXTRACTION      Family History  Problem Relation Age of Onset   Thyroid disease Mother    Hyperlipidemia Mother    Depression Mother    GER disease Mother    Hypertension Mother    Other Mother        prediabetes   Hyperlipidemia Father    Alcohol abuse Maternal Grandmother    Diabetes Maternal Grandmother    Cancer Maternal Grandfather        colon and prostate. age 57 in 2018  Stroke Maternal Grandfather        in 90s   Dementia Maternal Grandfather        vascular   Healthy Sister     Medications- reviewed and updated Current Outpatient Medications  Medication Sig Dispense Refill   Cholecalciferol (VITAMIN D) 50 MCG (2000 UT) CAPS Take by mouth.     CONTOUR NEXT TEST test strip USE TO TEST BLOOD SUGAR UP TO 10 TIMES A DAY AS INSTRUCTED 300 each 11   escitalopram (LEXAPRO) 5 MG tablet Take 1 tablet (5 mg total) by mouth daily. 30 tablet 5    Glucagon 3 MG/DOSE POWD USE AS NEEDED FOR HYPOGLYCEMIA 1 each 11   insulin degludec (TRESIBA FLEXTOUCH) 100 UNIT/ML SOPN FlexTouch Pen Inject 0.2-0.26 mLs (20-26 Units total) into the skin daily. 5 pen 5   insulin lispro (HUMALOG) 100 UNIT/ML cartridge Inject up to 75 units into pump daily as advised. 30 mL 2   insulin lispro (HUMALOG) 100 UNIT/ML injection Inject up to 75 units into the pump daily as advised 30 mL 1   Insulin Pen Needle (BD PEN NEEDLE NANO U/F) 32G X 4 MM MISC Use 4x a day 200 each 11   No current facility-administered medications for this visit.    Allergies-reviewed and updated Allergies  Allergen Reactions   Penicillins Rash    Social History   Social History Narrative   Married. Daughter 57 years old Valentina Gu and Kenard Gower 80 almost 35 year old in 2022. One child passed early -after birth.       FNP endocrinology   App undergrad- graduated [employer]. Masters at [employer].       Regular exercise: yes, biking, run, weight lifting- back to crossfit, mountain biking- slow on this compared to previous with neck      Caffeine use: daily      Objective:  Pulse 63  Constitutional: Conversant, no acute distress Eyes: Anicteric sclera; no lid lag or proptosis Respiratory: Normal respiratory effort, respiratory rate 16 Cardiovascular: No obvious peripheral edema in arms Skin: No rash, lesions or ulcers Musculoskeletal: No digital cyanosis.   Neurological: Normal speech, moves upper extremities without difficulty Psych: Intact judgment and insight.  Alert and oriented x3 with friendly affect.     Assessment and Plan:  34 y.o. male presenting for annual physical.  Health Maintenance counseling: 1. Anticipatory guidance: Patient counseled regarding regular dental exams -q6 months, eye exams -yearly,  avoiding smoking and second hand smoke , limiting alcohol to 2 beverages per day- does not drink, no illicit drugs 2. Risk factor reduction:  Advised patient of need for regular exercise  and diet rich and fruits and vegetables to reduce risk of heart attack and stroke. Exercise- exercising daily crossfit (in his garage with advanced set up)/weight lifting combo and some running. Has been hard to get biking in- takes 2 hours and hard with kids. Diet-reasonably healthy.  Wt Readings from Last 3 Encounters:  10/16/20 175 lb 3.2 oz (79.5 kg)  09/01/20 174 lb (78.9 kg)  04/16/20 175 lb 9.6 oz (79.7 kg)  3. Immunizations/screenings/ancillary studies-vaccinations up-to-date other than he could receive Prevnar 20 at next office visit- hold for now.  He will message [place] with dates of COVID-19 vaccination.  Needs diabetic foot exam at next office appointment.  Discussed hepatitis C screening- opts out for now  Immunization History  Administered Date(s) Administered   Influenza-Unspecified 02/14/2012, 02/12/2013, 02/13/2017   PFIZER(Purple Top)SARS-COV-2 Vaccination 03/25/2020   Pneumococcal Polysaccharide-23 05/24/2012   Tdap  03/26/2008, 11/29/2018  4. Prostate cancer screening- No early family history, start at age 60. Gradnfather in 20s.  5. Colon cancer screening - No family history, start at age 22 Grandfather had bad diverticulitis and colon partial resection in 62s but no colon cancer no rectal bleeding or melena 6. Skin cancer screening/prevention- saw dermatologist 6 months ago- option 1 year follow up. advised regular sunscreen use. Denies worrisome, changing, or new skin lesions.  7. Testicular cancer screening- advised monthly self exams  8. STD screening- patient opts out as monogamous 9. Never  smoker  Status of chronic or acute concerns   #Covid 59- recovering well after having covid- today is feeling much better. He was not interested in antivirals  # Diabetes type I managed by Dr. Elvera Lennox S: Medication: Humalog 100 unit mL injection Lab Results  Component Value Date   HGBA1C 5.7 (A) 10/16/2020   HGBA1C 5.7 08/05/2019   HGBA1C 6.4 (A) 11/23/2018  A/P: Stable.  Continue current medications.   #Elevated TSH- following Dr. Elvera Lennox- consider repeat next labs Lab Results  Component Value Date   TSH 2.67 04/08/2020    # GERD S:Medication:  none. A/P:  intermittent mild issues- can take protonix if needed    # Depression/Anxiety S: Medication: Lexapro 20 mg. Tapered down to 10 mg and now doing 10 mg every other day. Mild dizziness Depression screen [facility] 2/9 11/05/2020 11/29/2018 12/09/2016  Decreased Interest 0 1 0  Down, Depressed, Hopeless 0 2 0  PHQ - 2 Score 0 3 0  Altered sleeping 0 3 -  Tired, decreased energy 0 1 -  Change in appetite 0 1 -  Feeling bad or failure about yourself  0 1 -  Trouble concentrating 1 1 -  Moving slowly or fidgety/restless 0 1 -  Suicidal thoughts 0 0 -  PHQ-9 Score 1 11 -  Difficult doing work/chores Not difficult at all Somewhat difficult -  A/P: feels like doing overall well recently- wants to wean off completely- he will let me know if recurrence- has never really felt like meds have made a significant difference.  -wife will also monitor and alert him if he changes in mood significantly -good community with crossfit  Declines routine labs- had these last November- consider at next CPE. Visit today virtual due to patient having covid.   Takes vitamin D but no true lows  Recommended follow up: No follow-ups on file. Future Appointments  Date Time Provider Department Center  04/16/2021  9:00 AM Carlus Pavlov, MD [id number] None    Lab/Order associations: fasting   ICD-10-CM   1. Preventative health care  Z00.00       Meds ordered this encounter  Medications   escitalopram (LEXAPRO) 5 MG tablet    Sig: Take 1 tablet (5 mg total) by mouth daily.    Dispense:  30 tablet    Refill:  5   I,Harris Phan,acting as a scribe for Tana Conch, MD.,have documented all relevant documentation on the behalf of Tana Conch, MD,as directed by  Tana Conch, MD while in the presence of Tana Conch,  MD.  I, Tana Conch, MD, have reviewed all documentation for this visit. The documentation on 11/05/20 for the exam, diagnosis, procedures, and orders are all accurate and complete.  Return precautions advised.   Tana Conch, MD

## 2020-11-05 NOTE — Patient Instructions (Addendum)
Health Maintenance Due  Topic Date Due   Hepatitis C Screening Will do at his next in office visit.  Never done   FOOT EXAM Will be done at his next in office visit.  11/29/2019   COVID-19 Vaccine (2 - Pfizer series) will Mychart message [place] with the dates.  04/15/2020    Recommended follow up: No follow-ups on file.

## 2020-11-09 ENCOUNTER — Other Ambulatory Visit (HOSPITAL_COMMUNITY): Payer: Self-pay

## 2020-11-09 ENCOUNTER — Other Ambulatory Visit: Payer: Self-pay | Admitting: Internal Medicine

## 2020-11-09 MED ORDER — OMNIPOD 5 DEXG7G6 INTRO GEN 5 KIT
1.0000 | PACK | Freq: Once | 0 refills | Status: AC
  Filled 2020-11-09 (×2): qty 1, 30d supply, fill #0

## 2020-11-10 ENCOUNTER — Other Ambulatory Visit (HOSPITAL_COMMUNITY): Payer: Self-pay

## 2020-11-11 NOTE — Telephone Encounter (Signed)
Sammy from [facility] ph# [phone] called again re: Request for CMN form that was faxed to fax# [phone] on 10/30/20 & 11/09/20 to be filled out and faxed to [facility] at Fax# [phone]

## 2020-11-12 ENCOUNTER — Other Ambulatory Visit (HOSPITAL_COMMUNITY): Payer: Self-pay

## 2020-11-12 ENCOUNTER — Other Ambulatory Visit: Payer: Self-pay | Admitting: Internal Medicine

## 2020-11-13 NOTE — Telephone Encounter (Signed)
Fowarding to Mark Barr to have this form ,please advise

## 2020-11-19 ENCOUNTER — Encounter: Payer: Self-pay | Admitting: Internal Medicine

## 2020-11-19 NOTE — Telephone Encounter (Signed)
Form was completed and faxed to [phone]. Confirmation fax received

## 2020-11-19 NOTE — Telephone Encounter (Signed)
Sammy from [facility] called again ph# [phone] called again re: Status of Request for CMN form that was faxed to fax# [phone] on 10/30/20 & 11/09/20 to be filled out and faxed to [facility] at Fax# [phone]

## 2020-11-20 ENCOUNTER — Other Ambulatory Visit (HOSPITAL_COMMUNITY): Payer: Self-pay

## 2020-12-07 ENCOUNTER — Other Ambulatory Visit (HOSPITAL_COMMUNITY): Payer: Self-pay

## 2020-12-09 ENCOUNTER — Encounter: Payer: Self-pay | Admitting: Family Medicine

## 2020-12-16 ENCOUNTER — Encounter: Payer: Self-pay | Admitting: Internal Medicine

## 2020-12-31 ENCOUNTER — Encounter: Payer: Self-pay | Admitting: Family Medicine

## 2021-01-01 ENCOUNTER — Other Ambulatory Visit (HOSPITAL_COMMUNITY): Payer: Self-pay

## 2021-01-01 MED ORDER — SERTRALINE HCL 50 MG PO TABS
50.0000 mg | ORAL_TABLET | Freq: Every day | ORAL | 3 refills | Status: DC
  Filled 2021-01-01: qty 90, 90d supply, fill #0

## 2021-01-08 LAB — HM DIABETES EYE EXAM

## 2021-01-11 ENCOUNTER — Encounter: Payer: Self-pay | Admitting: Family Medicine

## 2021-01-13 ENCOUNTER — Encounter: Payer: Self-pay | Admitting: Internal Medicine

## 2021-01-14 ENCOUNTER — Encounter: Payer: Self-pay | Admitting: Internal Medicine

## 2021-01-15 ENCOUNTER — Telehealth: Payer: Self-pay

## 2021-01-15 ENCOUNTER — Other Ambulatory Visit (HOSPITAL_COMMUNITY): Payer: Self-pay

## 2021-01-15 ENCOUNTER — Other Ambulatory Visit: Payer: Self-pay | Admitting: Family Medicine

## 2021-01-15 MED ORDER — HYDROCORTISONE ACE-PRAMOXINE 2.5-1 % EX LOTN
TOPICAL_LOTION | CUTANEOUS | 1 refills | Status: DC
  Filled 2021-01-15: qty 59, 30d supply, fill #0

## 2021-01-15 MED ORDER — ESCITALOPRAM OXALATE 10 MG PO TABS
10.0000 mg | ORAL_TABLET | Freq: Every day | ORAL | 5 refills | Status: DC
  Filled 2021-01-15: qty 30, 30d supply, fill #0
  Filled 2021-02-18: qty 30, 30d supply, fill #1
  Filled 2021-03-26: qty 30, 30d supply, fill #2
  Filled 2021-04-29: qty 30, 30d supply, fill #3
  Filled 2021-06-21: qty 30, 30d supply, fill #4
  Filled 2021-09-03: qty 30, 30d supply, fill #5

## 2021-01-15 NOTE — Telephone Encounter (Signed)
States does not carry lotion in analpram-HC.  States does have the generic cream.   Would like a call back to replace.  Can reach any pharmacist at [phone].

## 2021-01-15 NOTE — Addendum Note (Signed)
Addended by: Shelva Majestic on: 01/15/2021 11:56 AM   Modules accepted: Orders

## 2021-01-15 NOTE — Telephone Encounter (Signed)
Called pharmacy and spoke to [place] she said they have the same Rx Analpram-HC in cream same strength if that is okay, otherwise will not be able to get till Tuesday due closed for the holiday. Told her that is fine.Megan verbalized understanding.

## 2021-01-19 ENCOUNTER — Other Ambulatory Visit: Payer: Self-pay | Admitting: Internal Medicine

## 2021-01-19 ENCOUNTER — Other Ambulatory Visit (HOSPITAL_COMMUNITY): Payer: Self-pay

## 2021-01-20 ENCOUNTER — Other Ambulatory Visit (HOSPITAL_COMMUNITY): Payer: Self-pay

## 2021-01-20 MED FILL — Insulin Lispro Inj Soln 100 Unit/ML: INTRAMUSCULAR | 40 days supply | Qty: 30 | Fill #0 | Status: AC

## 2021-02-04 ENCOUNTER — Encounter: Payer: Self-pay | Admitting: Family Medicine

## 2021-02-04 ENCOUNTER — Telehealth: Payer: Self-pay | Admitting: Internal Medicine

## 2021-02-04 NOTE — Telephone Encounter (Signed)
[facility] calling in regards to form needs to be filled out for [facility] transmitters/sensors. Form was faxed 9/13. Please call [phone] Fax num: [phone]

## 2021-02-05 ENCOUNTER — Other Ambulatory Visit: Payer: Self-pay

## 2021-02-10 NOTE — Telephone Encounter (Signed)
FOLLOW UP CALL from [facility] to fill out forms and fax.   [facility] calling in regards to form needs to be filled out for [facility] transmitters/sensors. Form was faxed 9/13. Please call [phone] Fax num: [phone]

## 2021-02-11 NOTE — Telephone Encounter (Signed)
Inbound fax requesting forms be completed. Forms completed and faxed to [phone].

## 2021-02-16 ENCOUNTER — Encounter: Payer: Self-pay | Admitting: Family Medicine

## 2021-02-17 ENCOUNTER — Other Ambulatory Visit: Payer: Self-pay

## 2021-02-17 DIAGNOSIS — K644 Residual hemorrhoidal skin tags: Secondary | ICD-10-CM

## 2021-02-18 ENCOUNTER — Other Ambulatory Visit (HOSPITAL_COMMUNITY): Payer: Self-pay

## 2021-03-02 ENCOUNTER — Other Ambulatory Visit (HOSPITAL_COMMUNITY): Payer: Self-pay

## 2021-03-02 MED FILL — Insulin Lispro Inj Soln 100 Unit/ML: INTRAMUSCULAR | 40 days supply | Qty: 30 | Fill #1 | Status: AC

## 2021-03-17 ENCOUNTER — Other Ambulatory Visit (HOSPITAL_COMMUNITY): Payer: Self-pay

## 2021-03-17 ENCOUNTER — Other Ambulatory Visit: Payer: Self-pay | Admitting: Internal Medicine

## 2021-03-17 MED ORDER — OMNIPOD 5 DEXG7G6 PODS GEN 5 MISC
1.0000 | 11 refills | Status: DC
  Filled 2021-03-17: qty 15, 30d supply, fill #0

## 2021-03-22 ENCOUNTER — Other Ambulatory Visit (HOSPITAL_COMMUNITY): Payer: Self-pay

## 2021-03-26 ENCOUNTER — Other Ambulatory Visit (HOSPITAL_COMMUNITY): Payer: Self-pay

## 2021-03-29 ENCOUNTER — Other Ambulatory Visit (HOSPITAL_COMMUNITY): Payer: Self-pay

## 2021-04-01 ENCOUNTER — Encounter: Payer: Self-pay | Admitting: Family Medicine

## 2021-04-02 ENCOUNTER — Other Ambulatory Visit: Payer: Self-pay

## 2021-04-02 MED ORDER — PANTOPRAZOLE SODIUM 40 MG PO TBEC: DELAYED_RELEASE_TABLET | ORAL | 0 refills | Status: DC

## 2021-04-08 ENCOUNTER — Telehealth: Payer: No Typology Code available for payment source | Admitting: Physician Assistant

## 2021-04-08 DIAGNOSIS — J019 Acute sinusitis, unspecified: Secondary | ICD-10-CM

## 2021-04-08 DIAGNOSIS — B9689 Other specified bacterial agents as the cause of diseases classified elsewhere: Secondary | ICD-10-CM | POA: Diagnosis not present

## 2021-04-08 MED ORDER — AZITHROMYCIN 250 MG PO TABS: ORAL_TABLET | ORAL | 0 refills | Status: AC

## 2021-04-08 MED ORDER — AZITHROMYCIN 250 MG PO TABS: ORAL_TABLET | ORAL | 0 refills | Status: DC

## 2021-04-08 NOTE — Addendum Note (Signed)
Addended by: Margaretann Loveless on: 04/08/2021 06:05 PM   Modules accepted: Orders

## 2021-04-08 NOTE — Progress Notes (Signed)
E-Visit for Sinus Problems  We are sorry that you are not feeling well.  Here is how we plan to help!  Based on what you have shared with me it looks like you have sinusitis.  Sinusitis is inflammation and infection in the sinus cavities of the head.  Based on your presentation I believe you most likely have Acute Bacterial Sinusitis.  This is an infection caused by bacteria and is treated with antibiotics. I have prescribed Azithromycin 250mg  Take 2 tablets on day 1, then 1 tablet daily for next 4 days. You may use an oral decongestant such as Mucinex D or if you have glaucoma or high blood pressure use plain Mucinex. Saline nasal spray help and can safely be used as often as needed for congestion.  If you develop worsening sinus pain, fever or notice severe headache and vision changes, or if symptoms are not better after completion of antibiotic, please schedule an appointment with a health care provider.    Sinus infections are not as easily transmitted as other respiratory infection, however we still recommend that you avoid close contact with loved ones, especially the very young and elderly.  Remember to wash your hands thoroughly throughout the day as this is the number one way to prevent the spread of infection!  Home Care: Only take medications as instructed by your medical team. Complete the entire course of an antibiotic. Do not take these medications with alcohol. A steam or ultrasonic humidifier can help congestion.  You can place a towel over your head and breathe in the steam from hot water coming from a faucet. Avoid close contacts especially the very young and the elderly. Cover your mouth when you cough or sneeze. Always remember to wash your hands.  Get Help Right Away If: You develop worsening fever or sinus pain. You develop a severe head ache or visual changes. Your symptoms persist after you have completed your treatment plan.  Make sure you Understand these  instructions. Will watch your condition. Will get help right away if you are not doing well or get worse.  Thank you for choosing an e-visit.  Your e-visit answers were reviewed by a board certified advanced clinical practitioner to complete your personal care plan. Depending upon the condition, your plan could have included both over the counter or prescription medications.  Please review your pharmacy choice. Make sure the pharmacy is open so you can pick up prescription now. If there is a problem, you may contact your provider through and have the prescription routed to another pharmacy.  Your safety is important to [employer]. If you have drug allergies check your prescription carefully.   For the next 24 hours you can use MyChart to ask questions about today's visit, request a non-urgent call back, or ask for a work or school excuse. You will get an email in the next two days asking about your experience. I hope that your e-visit has been valuable and will speed your recovery.  I provided 6 minutes of non face-to-face time during this encounter for chart review and documentation.

## 2021-04-12 ENCOUNTER — Other Ambulatory Visit: Payer: Self-pay | Admitting: Internal Medicine

## 2021-04-12 ENCOUNTER — Other Ambulatory Visit (HOSPITAL_COMMUNITY): Payer: Self-pay

## 2021-04-12 MED ORDER — INSULIN LISPRO 100 UNIT/ML IJ SOLN
75.0000 [IU] | Freq: Every day | INTRAMUSCULAR | 1 refills | Status: DC
  Filled 2021-04-12: qty 30, 40d supply, fill #0
  Filled 2021-05-20: qty 30, 40d supply, fill #1

## 2021-04-13 ENCOUNTER — Other Ambulatory Visit (HOSPITAL_COMMUNITY): Payer: Self-pay

## 2021-04-13 ENCOUNTER — Encounter: Payer: Self-pay | Admitting: Family Medicine

## 2021-04-13 MED ORDER — METRONIDAZOLE 500 MG PO TABS
500.0000 mg | ORAL_TABLET | Freq: Three times a day (TID) | ORAL | 0 refills | Status: AC
  Filled 2021-04-13: qty 15, 5d supply, fill #0

## 2021-04-13 MED ORDER — DOXYCYCLINE HYCLATE 100 MG PO TABS
100.0000 mg | ORAL_TABLET | Freq: Two times a day (BID) | ORAL | 0 refills | Status: AC
  Filled 2021-04-13: qty 10, 5d supply, fill #0

## 2021-04-13 NOTE — Telephone Encounter (Signed)
Also note indoor cat and up to date on rabies vaccine

## 2021-04-16 ENCOUNTER — Other Ambulatory Visit: Payer: Self-pay

## 2021-04-16 ENCOUNTER — Other Ambulatory Visit (HOSPITAL_COMMUNITY): Payer: Self-pay

## 2021-04-16 ENCOUNTER — Encounter: Payer: Self-pay | Admitting: Internal Medicine

## 2021-04-16 ENCOUNTER — Ambulatory Visit: Payer: No Typology Code available for payment source | Admitting: Internal Medicine

## 2021-04-16 VITALS — BP 120/68 | HR 71 | Ht 69.0 in | Wt 173.0 lb

## 2021-04-16 DIAGNOSIS — E063 Autoimmune thyroiditis: Secondary | ICD-10-CM | POA: Diagnosis not present

## 2021-04-16 DIAGNOSIS — E109 Type 1 diabetes mellitus without complications: Secondary | ICD-10-CM | POA: Diagnosis not present

## 2021-04-16 MED ORDER — GVOKE HYPOPEN 1-PACK 1 MG/0.2ML ~~LOC~~ SOAJ
SUBCUTANEOUS | 99 refills | Status: AC
Start: 2021-04-16 — End: ?
  Filled 2021-04-16: qty 0.4, 30d supply, fill #0
  Filled 2021-11-02: qty 0.4, 1d supply, fill #0

## 2021-04-16 NOTE — Patient Instructions (Addendum)
Please continue (t:slim): - Basal rates:   12 am: 0.450, ISF 1:55, ICR 1:10 3 am : 0.750, ISF 1:55, ICR 1:10 7 am: 0.850, ISF 1:40, ICR 1:9 10 am: 0.700, ISF 1:50, ICR 1:9 3 pm: 0.700, ISF 1:55, ICR 1:9 9 pm: 0.700, ISF 1:50, ICR 1:10 - target CBG: 110 - Active insulin time: 5h  Please return in 6 months.

## 2021-04-16 NOTE — Progress Notes (Signed)
Patient ID: Mark Barr, male   DOB: [birth year], 35 y.o.   MRN: [id number]  This visit occurred during the SARS-CoV-2 public health emergency.  Safety protocols were in place, including screening questions prior to the visit, additional usage of staff PPE, and extensive cleaning of exam room while observing appropriate contact time as indicated for disinfecting solutions.   HPI: CEBASTIAN Barr is a 35 y.o.-year-old male, returning for f/u for DM1, dx 4 (at 35 y/o), controlled, w/o complications, on insulin pump since 2000. Last visit 6 months ago.  Interim history: No increased urination, blurry vision, nausea, chest pain. He has chronic insomnia. He had COVID-19 in 10/2020.  He recovered well.  Insulin pump: - Medtronic Revel 530 G loaner - Omnipod since12/09/2013 >> loved it, however he developed a blistering a rash and the attaching site (started to use Flonase before attaching the pump, which helped) - Medtronic 723  - Medtronic 670G since 10/2015 - T:slim X2 since 03/2017.  He is using the control IQ technology. - now Omnipod 5 since last OV, but he uses this intermittently alternating with the tandem t:slim pump - started this back yesterday  CGM: -Dexcom G6 since 01/2017 -supplies from [facility]  Insulin: -He initially tried NovoLog and Fiasp in the pump, but did not see a difference between the 2 in the 670 G pump.   -After he switched to the T slim pump, he felt Candie Mile was working better for him -However, he is now using Humalog per insurance preference -We tried Lyumjev 04/2020 but this caused burning at the infusion site so he stopped  Reviewed HbA1c levels: Lab Results  Component Value Date   HGBA1C 5.7 (A) 10/16/2020   HGBA1C 5.7 08/05/2019   HGBA1C 6.4 (A) 11/23/2018   HGBA1C 6.3 04/16/2018   HGBA1C 6.6 (H) 02/26/2016   HGBA1C 6.5 10/01/2015   HGBA1C 7.1 (H) 06/25/2015   HGBA1C 6.8 02/19/2015   HGBA1C 6.5 08/25/2014   HGBA1C 7.2 (H) 04/28/2014   HGBA1C 6.9  (H) 12/24/2013   HGBA1C 6.1 09/16/2013   HGBA1C 6.6 (H) 05/13/2013   HGBA1C 6.9 (H) 02/11/2013  04/10/2018: HbA1c 6.3% 09/15/2017: HbA1c 6.4%  Pump settings:   - Basal rates:   12 am: 0.450, ISF 1:55, ICR 1:10 3 am : 0.750, ISF 1:55, ICR 1:10 7 am: 0.850, ISF 1:40, ICR 1:9 10 am: 0.700, ISF 1:50, ICR 1:9 3 pm: 0.700, ISF 1:55, ICR 1:9 9 pm: 0.700, ISF 1:50, ICR 1:10 - target CBG: 110 - Active insulin time: 5h  CGM parameters:  - Average from CGM: 143 >> 136 >> 142 >> 140 >> 139    Prev.:     Previously:   Total daily dose from basal: 20 >> 16 units a day (43%) >> 19 units (45%) >> same  Total daily dose from bolus 23 >>> 23 units a day (57%) >> 23 units (54%) >> same  Uses 41-60 units a day. - highest CBG:  340 >> 366 >> 369 >> upper 300s - lowest CBG: 40 >> 40 >> 43 >> 40  Pt's meals are: - Breakfast:  2 eggs + bagel + coffee, sometimes cereals or occasionally morning PB + jelly - Lunch: sandwich, fruit (apple), veggies (carrots) and, goldfish >> Pb + jelly + apple - Dinner: chicken, rice, and veggies - Snacks: 2 snacks: almonds, fruit, or cheese crackers  He is exercising around 5 pm-disconnect the pump when exercising.  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   -  No CKD: Lab Results  Component Value Date   BUN 11 04/08/2020   CREATININE 1.04 04/08/2020   -Normal ACR: Lab Results  Component Value Date   MICRALBCREAT 2.4 04/08/2020   MICRALBCREAT NOTE 11/23/2018   MICRALBCREAT 0.9 02/26/2016   MICRALBCREAT 2.4 02/23/2015   MICRALBCREAT 0.5 12/24/2013   MICRALBCREAT 0.3 02/11/2013   -No HL; last set of lipids:  Lab Results  Component Value Date   CHOL 160 04/08/2020   HDL 66.20 04/08/2020   LDLCALC 86 04/08/2020   TRIG 40.0 04/08/2020   CHOLHDL 2 04/08/2020   - last eye exam was on 01/08/2021: + mild PDR OU. Dr. Charlotte Sanes  - no numbness and tingling in his feet.  Hashimoto thyroiditis: -He did not require  levothyroxine  Latest TFTs were normal: Lab Results  Component Value Date   TSH 2.67 04/08/2020   TSH 2.58 01/16/2019   TSH 4.84 (H) 11/23/2018   TSH 3.85 09/25/2017   TSH 1.61 09/27/2016   TSH 2.24 02/26/2016   TSH 2.27 06/25/2015   TSH 4.50 02/23/2015   TSH 1.34 12/24/2013   TSH 2.35 02/11/2013   TSH 1.98 10/10/2012   Lab Results  Component Value Date   FREET4 0.74 04/08/2020   FREET4 1.0 01/16/2019   FREET4 0.9 11/23/2018   FREET4 0.9 09/25/2017   FREET4 0.78 09/27/2016   FREET4 0.75 02/26/2016   FREET4 0.86 06/25/2015   FREET4 0.79 02/11/2013   Lab Results  Component Value Date   T3FREE 2.4 04/08/2020   T3FREE 2.8 01/16/2019   T3FREE 3.0 11/23/2018   T3FREE 2.8 09/25/2017   T3FREE 3.3 02/26/2016   T3FREE 3.5 06/25/2015   T3FREE 2.7 02/11/2013   His antithyroid antibodies were elevated: Component     Latest Ref Rng & Units 06/25/2015  Thyroperoxidase Ab SerPl-aCnc     <9 IU/mL 75 (H)  Thyroglobulin Ab     <2 IU/mL 4 (H)   He had hip surgery 04/2019.  He has 2 daughters.  ROS: + see HPI  I reviewed pt's medications, allergies, PMH, social hx, family hx, and changes were documented in the history of present illness. Otherwise, unchanged from my initial visit note.  Past Medical History:  Diagnosis Date   Chicken pox    Depression    Diabetes mellitus without complication (HCC)    Eating disorder    Past Surgical History:  Procedure Laterality Date   APPENDECTOMY     LASIK     2013 or 2014   WISDOM TOOTH EXTRACTION     Social History   Socioeconomic History   Marital status: Married    Spouse name: Not on file   Number of children: Not on file   Years of education: Not on file   Highest education level: Not on file  Occupational History   Occupation: Nurse Practitioner    Employer: [employer]  Tobacco Use   Smoking status: Never   Smokeless tobacco: Never  Vaping Use   Vaping Use: Never used  Substance and Sexual Activity   Alcohol  use: No   Drug use: No   Sexual activity: Yes  Other Topics Concern   Not on file  Social History Narrative   Married. Daughter 29 years old Mark Barr and Mark Barr 26 almost 35 year old in 2022. One child passed early -after birth.       FNP endocrinology   App undergrad- graduated [employer]. Masters at [employer].       Regular exercise: yes, biking,  run, weight lifting- back to crossfit, mountain biking- slow on this compared to previous with neck      Caffeine use: daily   Social Determinants of Health   Financial Resource Strain: Not on file  Food Insecurity: Not on file  Transportation Needs: Not on file  Physical Activity: Not on file  Stress: Not on file  Social Connections: Not on file  Intimate Partner Violence: Not on file   Current Outpatient Medications on File Prior to Visit  Medication Sig Dispense Refill   Cholecalciferol (VITAMIN D) 50 MCG (2000 UT) CAPS Take by mouth.     CONTOUR NEXT TEST test strip USE TO TEST BLOOD SUGAR UP TO 10 TIMES A DAY AS INSTRUCTED 300 each 11   doxycycline (VIBRA-TABS) 100 MG tablet Take 1 tablet (100 mg total) by mouth 2 (two) times daily for 5 days. 10 tablet 0   escitalopram (LEXAPRO) 10 MG tablet Take 1 tablet (10 mg total) by mouth daily. 30 tablet 5   Glucagon 3 MG/DOSE POWD USE AS NEEDED FOR HYPOGLYCEMIA 1 each 11   Hydrocortisone Ace-Pramoxine 2.5-1 % LOTN Use three times daily as needed for hemorrhoids for up to 7 days. 59 mL 1   insulin degludec (TRESIBA FLEXTOUCH) 100 UNIT/ML SOPN FlexTouch Pen Inject 0.2-0.26 mLs (20-26 Units total) into the skin daily. 5 pen 5   Insulin Disposable Pump (OMNIPOD 5 G6 POD, GEN 5,) MISC Use every other day. 15 each 11   insulin lispro (HUMALOG) 100 UNIT/ML injection Inject up to 75 units into the pump daily as advised 30 mL 1   Insulin Pen Needle (BD PEN NEEDLE NANO U/F) 32G X 4 MM MISC Use 4x a day 200 each 11   metroNIDAZOLE (FLAGYL) 500 MG tablet Take 1 tablet (500 mg total) by mouth 3 (three) times daily for 5  days. No alcohol while taking this medicine 15 tablet 0   pantoprazole (PROTONIX) 40 MG tablet TAKE 1 TABLET BY MOUTH DAILY. 30 tablet 0   No current facility-administered medications on file prior to visit.   Allergies  Allergen Reactions   Penicillins Rash   Family History  Problem Relation Age of Onset   Thyroid disease Mother    Hyperlipidemia Mother    Depression Mother    GER disease Mother    Hypertension Mother    Other Mother        prediabetes   Hyperlipidemia Father    Alcohol abuse Maternal Grandmother    Diabetes Maternal Grandmother    Cancer Maternal Grandfather        colon and prostate. age 82 in 2018   Stroke Maternal Grandfather        in 76s   Dementia Maternal Grandfather        vascular   Healthy Sister    PE: There were no vitals taken for this visit. There is no height or weight on file to calculate BMI. Wt Readings from Last 3 Encounters:  10/16/20 175 lb 3.2 oz (79.5 kg)  09/01/20 174 lb (78.9 kg)  04/16/20 175 lb 9.6 oz (79.7 kg)   Constitutional: normal weight, in NAD Eyes: PERRLA, EOMI, no exophthalmos ENT: moist mucous membranes, no thyromegaly, no cervical lymphadenopathy Cardiovascular: RRR, No MRG Respiratory: CTA B Musculoskeletal: no deformities, strength intact in all 4 Skin: moist, warm, no rashes Neurological: no tremor with outstretched hands, DTR normal in all 4  ASSESSMENT: 1. DM1, controlled, without complications except hypoglycemia  2. Hashimoto thyroiditis -Euthyroid  PLAN:  1. Patient with longstanding, well-controlled, type 2 diabetes, with history of hypoglycemic episodes, now improved.  At last visit he was on a t:slim insulin pump with integrated Dexcom G6 CGM, in the control IQ closed-loop mode.  He was happy with the system.  He was using the sleep mode throughout the day to allow him to stay within a narrow range: 110-120.  This did not provide him supplemental boluses compared to the regular control IQ mode.  At  last visit, states sugars are higher after lunch and we could not strengthen insulin to carb ratio due to hypoglycemia, I advised him to stop the sleep mode in the middle of the day.  At that time, I made specific suggestions about improving lunch (eliminate jelly, using whole-grain low-carb crease bread instead of regular bread), but I got a message from his therapist to not interfere with their diet plan due to previous eating disorder.  Sugars were fairly well controlled otherwise.  I did advise him to decrease his basal rate in the afternoon as he was having low blood sugars around this time especially after exercise.  Otherwise, we did not change his regimen.  HbA1c at last visit was excellent, at 5.7%. CGM interpretation: -At today's visit, we reviewed his CGM downloads: It appears that 75% of values are in target range (goal >70%), while 21% are higher than 180 (goal <25%), and 4% are lower than 70 (goal <4%).  The calculated average blood sugar is 139.  The projected HbA1c for the next 3 months (GMI) is 6.6%. -Reviewing the CGM trends, it appears that his sugars are well controlled throughout the day, with only few occasional higher blood sugars after meals.  His sugars were lower during the night, but he continues to adjust his pump settings.  As of yesterday, he switched from his OmniPod to the t:slim insulin pump.  He mentions that OmniPod is more suitable for him to prevent lows, but basically markedly more in the target range.  He has been on OmniPod more recently and this may be the reason why his average sugar is higher.  He is happy with how the OmniPod to get him to drop his blood sugars during and after exercise.  He is still having problems with the t:slim dropping his blood sugars too much after exercise despite being off the pump completely for the duration of exercise.  He is afraid to stop the pump for an hour before exercise due to possible increase in blood sugars.  However, as of now, I  needs to drink regular soda to avoid low blood sugars after exercise. -As of now, since his pump settings are quite dynamic as he is very proficient in adjusting them, and since there are no significant concerns or patterns in his blood sugars, I did not suggest any change in regimen -I did just send a prescription for G-voke to his pharmacy.  Baqsimi is not covered by his insurance -I advised him to: Patient Instructions  Please continue (t:slim): - Basal rates:   12 am: 0.450, ISF 1:55, ICR 1:10 3 am : 0.750, ISF 1:55, ICR 1:10 7 am: 0.850, ISF 1:40, ICR 1:9 10 am: 0.700, ISF 1:50, ICR 1:9 3 pm: 0.700, ISF 1:55, ICR 1:9 9 pm: 0.700, ISF 1:50, ICR 1:10 - target CBG: 110 - Active insulin time: 5h  Please return in 6 months.  - we checked his HbA1c: 6.3% (higher)  - advised to check sugars at different times of the day -  4x a day, rotating check times - advised for yearly eye exams >> he is UTD - will check annual labs when he returns to the clinic-ordered today.  He will return fasting. - return to clinic in 6 months  2. Hashimoto's thyroiditis -No signs or symptoms of hypothyroidism -Most recent TFTs were normal in 03/2020 Lab Results  Component Value Date   TSH 2.67 04/08/2020  -We will repeat these today  Carlus Pavlov, MD PhD [facility] Endocrinology

## 2021-04-26 ENCOUNTER — Other Ambulatory Visit (HOSPITAL_COMMUNITY): Payer: Self-pay

## 2021-04-29 ENCOUNTER — Other Ambulatory Visit (HOSPITAL_COMMUNITY): Payer: Self-pay

## 2021-05-20 ENCOUNTER — Other Ambulatory Visit (HOSPITAL_COMMUNITY): Payer: Self-pay

## 2021-05-30 ENCOUNTER — Encounter: Payer: Self-pay | Admitting: Family Medicine

## 2021-05-31 ENCOUNTER — Other Ambulatory Visit: Payer: Self-pay

## 2021-05-31 DIAGNOSIS — M79641 Pain in right hand: Secondary | ICD-10-CM

## 2021-06-01 ENCOUNTER — Ambulatory Visit
Admission: RE | Admit: 2021-06-01 | Discharge: 2021-06-01 | Disposition: A | Discharge status: Home / Self Care | Payer: No Typology Code available for payment source | Source: Ambulatory Visit | Attending: Family Medicine | Admitting: Family Medicine

## 2021-06-01 DIAGNOSIS — M79641 Pain in right hand: Secondary | ICD-10-CM

## 2021-06-02 ENCOUNTER — Telehealth: Payer: Self-pay

## 2021-06-02 NOTE — Telephone Encounter (Signed)
Fax requesting medical reasoning to change infusion sets and cartridges every 2 days has now been filled out and faxed to [facility].  Fax #: [phone]

## 2021-06-07 ENCOUNTER — Encounter: Payer: Self-pay | Admitting: Internal Medicine

## 2021-06-09 ENCOUNTER — Other Ambulatory Visit (HOSPITAL_COMMUNITY): Payer: Self-pay

## 2021-06-18 ENCOUNTER — Other Ambulatory Visit (INDEPENDENT_AMBULATORY_CARE_PROVIDER_SITE_OTHER): Payer: No Typology Code available for payment source

## 2021-06-18 ENCOUNTER — Other Ambulatory Visit: Payer: Self-pay

## 2021-06-18 DIAGNOSIS — E109 Type 1 diabetes mellitus without complications: Secondary | ICD-10-CM | POA: Diagnosis not present

## 2021-06-18 DIAGNOSIS — E063 Autoimmune thyroiditis: Secondary | ICD-10-CM | POA: Diagnosis not present

## 2021-06-18 LAB — LIPID PANEL
Cholesterol: 177 mg/dL (ref 0–200)
HDL: 64.1 mg/dL (ref >39.00)
LDL Cholesterol: 103 mg/dL — ABNORMAL HIGH (ref 0–99)
NonHDL: 113.21
Total CHOL/HDL Ratio: 3
Triglycerides: 51 mg/dL (ref 0.0–149.0)
VLDL: 10.2 mg/dL (ref 0.0–40.0)

## 2021-06-18 LAB — COMPREHENSIVE METABOLIC PANEL
ALT: 9 U/L (ref 0–53)
AST: 19 U/L (ref 0–37)
Albumin: 4.5 g/dL (ref 3.5–5.2)
Alkaline Phosphatase: 67 U/L (ref 39–117)
BUN: 13 mg/dL (ref 6–23)
CO2: 32 mEq/L (ref 19–32)
Calcium: 9.5 mg/dL (ref 8.4–10.5)
Chloride: 98 mEq/L (ref 96–112)
Creatinine, Ser: 1.15 mg/dL (ref 0.40–1.50)
GFR: 82.45 mL/min (ref >60.00)
Glucose, Bld: 173 mg/dL — ABNORMAL HIGH (ref 70–99)
Potassium: 3.9 mEq/L (ref 3.5–5.1)
Sodium: 136 mEq/L (ref 135–145)
Total Bilirubin: 1 mg/dL (ref 0.2–1.2)
Total Protein: 6.8 g/dL (ref 6.0–8.3)

## 2021-06-18 LAB — TSH: TSH: 3.7 u[IU]/mL (ref 0.35–5.50)

## 2021-06-18 LAB — MICROALBUMIN / CREATININE URINE RATIO
Creatinine,U: 59.4 mg/dL
Microalb Creat Ratio: 1.2 mg/g (ref 0.0–30.0)
Microalb, Ur: <0.7 mg/dL (ref 0.0–1.9)

## 2021-06-18 LAB — T4, FREE: Free T4: 0.71 ng/dL (ref 0.60–1.60)

## 2021-06-18 LAB — T3, FREE: T3, Free: 2.8 pg/mL (ref 2.3–4.2)

## 2021-06-21 ENCOUNTER — Other Ambulatory Visit (HOSPITAL_COMMUNITY): Payer: Self-pay

## 2021-06-29 ENCOUNTER — Other Ambulatory Visit (HOSPITAL_COMMUNITY): Payer: Self-pay

## 2021-06-29 ENCOUNTER — Other Ambulatory Visit: Payer: Self-pay | Admitting: Internal Medicine

## 2021-06-29 MED ORDER — INSULIN LISPRO 100 UNIT/ML IJ SOLN
75.0000 [IU] | Freq: Every day | INTRAMUSCULAR | 3 refills | Status: DC
  Filled 2021-06-29: qty 30, 40d supply, fill #0
  Filled 2021-08-10: qty 30, 40d supply, fill #1
  Filled 2021-09-15: qty 30, 40d supply, fill #2
  Filled 2021-11-02: qty 30, 40d supply, fill #3

## 2021-07-05 ENCOUNTER — Encounter: Payer: Self-pay | Admitting: Family Medicine

## 2021-07-19 ENCOUNTER — Encounter: Payer: Self-pay | Admitting: Family Medicine

## 2021-08-10 ENCOUNTER — Other Ambulatory Visit (HOSPITAL_COMMUNITY): Payer: Self-pay

## 2021-09-03 ENCOUNTER — Other Ambulatory Visit (HOSPITAL_BASED_OUTPATIENT_CLINIC_OR_DEPARTMENT_OTHER): Payer: Self-pay

## 2021-09-15 ENCOUNTER — Other Ambulatory Visit (HOSPITAL_COMMUNITY): Payer: Self-pay

## 2021-10-05 ENCOUNTER — Encounter: Payer: Self-pay | Admitting: Family Medicine

## 2021-10-05 DIAGNOSIS — F509 Eating disorder, unspecified: Secondary | ICD-10-CM

## 2021-10-06 ENCOUNTER — Other Ambulatory Visit (HOSPITAL_COMMUNITY): Payer: Self-pay

## 2021-10-06 MED ORDER — FLUOXETINE HCL 10 MG PO CAPS
10.0000 mg | ORAL_CAPSULE | Freq: Every day | ORAL | 5 refills | Status: DC
  Filled 2021-10-06: qty 30, 30d supply, fill #0

## 2021-10-06 NOTE — Addendum Note (Signed)
Addended by: Marin Olp on: 10/06/2021 04:05 PM   Modules accepted: Orders

## 2021-10-15 ENCOUNTER — Encounter: Payer: Self-pay | Admitting: Internal Medicine

## 2021-10-15 ENCOUNTER — Ambulatory Visit (INDEPENDENT_AMBULATORY_CARE_PROVIDER_SITE_OTHER): Payer: No Typology Code available for payment source | Admitting: Internal Medicine

## 2021-10-15 VITALS — BP 128/76 | HR 74 | Ht 69.0 in

## 2021-10-15 DIAGNOSIS — E063 Autoimmune thyroiditis: Secondary | ICD-10-CM

## 2021-10-15 DIAGNOSIS — E109 Type 1 diabetes mellitus without complications: Secondary | ICD-10-CM

## 2021-10-15 LAB — POCT GLYCOSYLATED HEMOGLOBIN (HGB A1C): Hemoglobin A1C: 6 % — AB (ref 4.0–5.6)

## 2021-10-15 NOTE — Patient Instructions (Addendum)
Please use the following pump settings - Basal rates:   12 am: 0.450, ISF 1:65, ICR 1:10 3 am : 0.700, ISF 1:60, ICR 1:10 7 am: 0.800, ISF 1:50, ICR 1:9 10 am: 0.700, ISF 1:50, ICR 1:9 5 pm: 0.800, ISF 1:40, ICR 1:9 9 pm: 0.800, ISF 1:40, ICR 1:10 - target CBG: 110 - Active insulin time: 2:30h   Please return in 6 month.

## 2021-10-15 NOTE — Progress Notes (Signed)
Patient ID: MARKS SCALERA, male   DOB: 1985/06/30, 36 y.o.   MRN: [id number]  This visit occurred during the SARS-CoV-2 public health emergency.  Safety protocols were in place, including screening questions prior to the visit, additional usage of staff PPE, and extensive cleaning of exam room while observing appropriate contact time as indicated for disinfecting solutions.   HPI: OLUWATIMILEHIN BALFOUR is a 36 y.o.-year-old male, returning for f/u for DM1, dx 32 (at 36 y/o), controlled, w/o complications, on insulin pump since 2000. Last visit 6 months ago.  Interim history: No increased urination, blurry vision, nausea, chest pain.  He does have fatigue. He continues to exercise approximately 1.5 hours a day.  He is on a low calorie diet, 1200 cal a day.  He is working with his therapist to increase his daily calories and reduce his exercise.   Insulin pump: - Medtronic Revel 530 G loaner - Omnipod since12/09/2013 >> loved it, however he developed a blistering a rash and the attaching site (started to use Flonase before attaching the pump, which helped) - Medtronic 723  - Medtronic 670G since 10/2015 - T:slim X2 since 03/2017.  He is using the control IQ technology. - Omnipod 5, but he uses this intermittently alternating with the tandem t:slim pump >>   CGM: -Dexcom G6 since 01/2017 -supplies from [place]  Insulin: -He initially tried NovoLog and Fiasp in the pump, but did not see a difference between the 2 in the 670 G pump.   -After he switched to the T slim pump, he felt [place]ie [place] was working better for him -However, he is now using Humalog per insurance preference -We tried Lyumjev 04/2020 but this caused burning at the infusion site so he stopped  Reviewed HbA1c levels: 04/16/2022: HbA1c 6.3% Lab Results  Component Value Date   HGBA1C 5.7 (A) 10/16/2020   HGBA1C 5.7 08/05/2019   HGBA1C 6.4 (A) 11/23/2018   HGBA1C 6.3 04/16/2018   HGBA1C 6.6 (H) 02/26/2016   HGBA1C 6.5  10/01/2015   HGBA1C 7.1 (H) 06/25/2015   HGBA1C 6.8 02/19/2015   HGBA1C 6.5 08/25/2014   HGBA1C 7.2 (H) 04/28/2014   HGBA1C 6.9 (H) 12/24/2013   HGBA1C 6.1 09/16/2013   HGBA1C 6.6 (H) 05/13/2013   HGBA1C 6.9 (H) 02/11/2013  04/10/2018: HbA1c 6.3% 09/15/2017: HbA1c 6.4%  Pump settings:   - Basal rates:   12 am: 0.450, ISF 1:55, ICR 1:10 3 am : 0.750, ISF 1:55, ICR 1:10 7 am: 0.850, ISF 1:40, ICR 1:9 10 am: 0.700, ISF 1:50, ICR 1:9 3 pm: 0.700, ISF 1:55, ICR 1:9 9 pm: 0.700, ISF 1:50, ICR 1:10 - target CBG: 110 - Active insulin time: 5h  CGM downloads:     Previously:   Total daily dose from basal: 219 units (45%) >> same >> 37% (15 units) Total daily dose from bolus: 23 units (54%) >> same >> 62% (26 units) Uses 41-60 units a day. - highest CBG:  340 >> 366 >> 369 >> upper 300s >> 356 (site pb) - lowest CBG: 40 >> 40 >> 43 >> 40 >> 40  Pt's meals are: - Breakfast:  2 eggs + bagel + coffee, sometimes cereals or occasionally morning PB + jelly - Lunch: sandwich, fruit (apple), veggies (carrots) and, goldfish >> Pb + jelly + apple - Dinner: chicken, rice, and veggies - Snacks: 2 snacks: almonds, fruit, or cheese crackers  He is exercising around 5 pm-disconnect the pump when exercising.  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   -No CKD: Lab Results  Component Value Date   BUN 13 06/18/2021   CREATININE 1.15 06/18/2021   -Normal ACR: Lab Results  Component Value Date   MICRALBCREAT 1.2 06/18/2021   MICRALBCREAT 2.4 04/08/2020   MICRALBCREAT NOTE 11/23/2018   MICRALBCREAT 0.9 02/26/2016   MICRALBCREAT 2.4 02/23/2015   MICRALBCREAT 0.5 12/24/2013   MICRALBCREAT 0.3 02/11/2013   -No HL; last set of lipids:  Lab Results  Component Value Date   CHOL 177 06/18/2021   HDL 64.10 06/18/2021   LDLCALC 103 (H) 06/18/2021   TRIG 51.0 06/18/2021   CHOLHDL 3 06/18/2021   - last eye exam was on 01/08/2021: + mild PDR OU. Dr. Charlotte SanesMcCuen  - no  numbness and tingling in his feet.  Hashimoto thyroiditis: -He did not require levothyroxine  Latest TFTs were normal: Lab Results  Component Value Date   TSH 3.70 06/18/2021   TSH 2.67 04/08/2020   TSH 2.58 01/16/2019   TSH 4.84 (H) 11/23/2018   TSH 3.85 09/25/2017   TSH 1.61 09/27/2016   TSH 2.24 02/26/2016   TSH 2.27 06/25/2015   TSH 4.50 02/23/2015   TSH 1.34 12/24/2013   TSH 2.35 02/11/2013   TSH 1.98 10/10/2012   Lab Results  Component Value Date   FREET4 0.71 06/18/2021   FREET4 0.74 04/08/2020   FREET4 1.0 01/16/2019   FREET4 0.9 11/23/2018   FREET4 0.9 09/25/2017   FREET4 0.78 09/27/2016   FREET4 0.75 02/26/2016   FREET4 0.86 06/25/2015   FREET4 0.79 02/11/2013   Lab Results  Component Value Date   T3FREE 2.8 06/18/2021   T3FREE 2.4 04/08/2020   T3FREE 2.8 01/16/2019   T3FREE 3.0 11/23/2018   T3FREE 2.8 09/25/2017   T3FREE 3.3 02/26/2016   T3FREE 3.5 06/25/2015   T3FREE 2.7 02/11/2013   His antithyroid antibodies were elevated: Component     Latest Ref Rng & Units 06/25/2015  Thyroperoxidase Ab SerPl-aCnc     <9 IU/mL 75 (H)  Thyroglobulin Ab     <2 IU/mL 4 (H)   He had hip surgery 04/2019.  He has 2 daughters.  ROS: + see HPI  I reviewed pt's medications, allergies, PMH, social hx, family hx, and changes were documented in the history of present illness. Otherwise, unchanged from my initial visit note.  Past Medical History:  Diagnosis Date   Chicken pox    Depression    Diabetes mellitus without complication (HCC)    Eating disorder    Past Surgical History:  Procedure Laterality Date   APPENDECTOMY     LASIK     2013 or 2014   WISDOM TOOTH EXTRACTION     Social History   Socioeconomic History   Marital status: Married    Spouse name: Not on file   Number of children: Not on file   Years of education: Not on file   Highest education level: Not on file  Occupational History   Occupation: Nurse Practitioner    Employer: [employer]  Tobacco Use   Smoking status: Never   Smokeless tobacco: Never  Vaping Use   Vaping Use: Never used  Substance and Sexual Activity   Alcohol use: No   Drug use: No   Sexual activity: Yes  Other Topics Concern   Not on file  Social History Narrative   Married. Daughter 10658 years old Valentina GuLucy and Kenard GowerDrew 652 almost 36 year old in 2022. One child passed early -after birth.  FNP endocrinology   App undergrad- graduated [employer]. Masters at [employer].       Regular exercise: yes, biking, run, weight lifting- back to crossfit, mountain biking- slow on this compared to previous with neck      Caffeine use: daily   Social Determinants of Health   Financial Resource Strain: Not on file  Food Insecurity: Not on file  Transportation Needs: Not on file  Physical Activity: Not on file  Stress: Not on file  Social Connections: Not on file  Intimate Partner Violence: Not on file   Current Outpatient Medications on File Prior to Visit  Medication Sig Dispense Refill   Cholecalciferol (VITAMIN D) 50 MCG (2000 UT) CAPS Take by mouth.     CONTOUR NEXT TEST test strip USE TO TEST BLOOD SUGAR UP TO 10 TIMES A DAY AS INSTRUCTED 300 each 11   FLUoxetine (PROZAC) 10 MG capsule Take 1 capsule (10 mg total) by mouth daily. 30 capsule 5   GVOKE HYPOPEN 1-PACK 1 MG/0.2ML SOAJ Use as needed 0.4 mL PRN   Hydrocortisone Ace-Pramoxine 2.5-1 % LOTN Use three times daily as needed for hemorrhoids for up to 7 days. 59 mL 1   insulin degludec (TRESIBA FLEXTOUCH) 100 UNIT/ML SOPN FlexTouch Pen Inject 0.2-0.26 mLs (20-26 Units total) into the skin daily. 5 pen 5   Insulin Disposable Pump (OMNIPOD 5 G6 POD, GEN 5,) MISC Use every other day. 15 each 11   insulin lispro (HUMALOG) 100 UNIT/ML injection Inject up to 75 units into the pump daily as advised 30 mL 3   Insulin Pen Needle (BD PEN NEEDLE NANO U/F) 32G X 4 MM MISC Use 4x a day 200 each 11   pantoprazole (PROTONIX) 40 MG tablet TAKE 1 TABLET BY MOUTH DAILY. 30  tablet 0   No current facility-administered medications on file prior to visit.   Allergies  Allergen Reactions   Penicillins Rash   Family History  Problem Relation Age of Onset   Thyroid disease Mother    Hyperlipidemia Mother    Depression Mother    GER disease Mother    Hypertension Mother    Other Mother        prediabetes   Hyperlipidemia Father    Alcohol abuse Maternal Grandmother    Diabetes Maternal Grandmother    Cancer Maternal Grandfather        colon and prostate. age 62 in 2018   Stroke Maternal Grandfather        in 57s   Dementia Maternal Grandfather        vascular   Healthy Sister    PE: BP 128/76 (BP Location: Right Arm, Patient Position: Sitting, Cuff Size: Normal)   Pulse 74   Ht 5\' 9"  (1.753 m)   SpO2 99%   BMI 25.55 kg/m  Pt. declined being weighed today Wt Readings from Last 3 Encounters:  04/16/21 173 lb (78.5 kg)  10/16/20 175 lb 3.2 oz (79.5 kg)  09/01/20 174 lb (78.9 kg)   Constitutional: normal weight, fit, in NAD Eyes: PERRLA, EOMI, no exophthalmos ENT: moist mucous membranes, no thyromegaly, no cervical lymphadenopathy Cardiovascular: RRR, No MRG Respiratory: CTA B Musculoskeletal: no deformities, strength intact in all 4 Skin: moist, warm, no rashes Neurological: no tremor with outstretched hands, DTR normal in all 4  ASSESSMENT: 1. DM1, controlled, without complications except hypoglycemia  2. Hashimoto thyroiditis -Euthyroid  PLAN:  1. Patient with longstanding, well-controlled, type 2 diabetes, with history of hypoglycemic episodes, now improved.  He has a t:slim and a OmniPod and alternates between the 2.  He is usually using the sleep mode throughout the day when using the t:slim pump to allow him to stay in the narrow range.  Currently using the t:slim pump. -We did not have to change his pump settings at last visit.  At that time, HbA1c was excellent, at 6.3%, only slightly higher than before.  Sugars were well  controlled throughout the day with only few occasional higher blood sugars after meals.  They were lower during the night.  When I last saw him, he was on t:slim, but he just changed from the OmniPod.  He mentions that OmniPod kept him in a higher range than t:slim.  He was having problems with t:slim dropping his sugars too much after exercise compared to OmniPod despite trying different pump settings and even being off the pump completely for the duration of exercise.  However, at last visit, he did not have significant lows. CGM interpretation: -At today's visit, we reviewed his CGM downloads: It appears that 97% of values are between 75 and 180, while 19% are higher than 180 (goal <25%), and 4% are lower than 75.  The calculated average blood sugar is 142.   -Reviewing the CGM trends, it appears that his sugars are overall well controlled, but he does have occasional hyperglycemic spikes after some meals but mostly after an initial decrease in blood sugars right after exercise.  He is exercising in the afternoon mostly and sugars are dropping after exercise despite stopping the pump.  He normally does not drop the sugars under 70s after exercise, but sugars increase after he attaches the pump frequently above 180 and even 200s.  He then tries to correct the speaks and this causes the blood sugars to drop under 70 mg/dL.  Some of the decreases are slow and he may have a nadir of low blood sugars between 12 and 2 AM.  He is trying to change his practice in managing the highs occurring after exercise.  As of now, he noticed that if he boluses 1 unit of insulin right after the finishing exercise and then bolusing normally for the meal, sugars are better.  We will continue with this practice for now.  He tried to change his insulin to carb ratio around this time, but this was not successful.  He also tried to manipulate the basal rate, but this did not help. -Otherwise, he does not appear to need any changes in  his regimen.   -He is interested in trying the iLet insulin pump, which may get the samples to try from the rep.  I advised him to let me know how he likes it. -He has a G-Voke pen at home.  Baqsimi was not covered by his insurance. -I advised him to: Patient Instructions  Please use the following pump settings - Basal rates:   12 am: 0.450, ISF 1:65, ICR 1:10 3 am : 0.700, ISF 1:60, ICR 1:10 7 am: 0.800, ISF 1:50, ICR 1:9 10 am: 0.700, ISF 1:50, ICR 1:9 5 pm: 0.800, ISF 1:40, ICR 1:9 9 pm: 0.800, ISF 1:40, ICR 1:10 - target CBG: 110 - Active insulin time: 2:30h  Please return in 6 month.  - we checked his HbA1c: 6.0%  - advised to check sugars at different times of the day - 4x a day, rotating check times - advised for yearly eye exams >> he is UTD - he has an annual physical exam coming  up with PCP later in the summer -he may have annual labs performed at that time. - return to clinic in 6 months  2. Hashimoto's thyroiditis -No signs or symptoms of hypothyroidism -Most recent TFTs were normal in 06/2021 Lab Results  Component Value Date   TSH 3.70 06/18/2021  -We will continue to keep an eye on his TFTs, plan to repeat them in a year from the previous  Carlus Pavlov, MD PhD [facility] Endocrinology

## 2021-11-02 ENCOUNTER — Other Ambulatory Visit (HOSPITAL_BASED_OUTPATIENT_CLINIC_OR_DEPARTMENT_OTHER): Payer: Self-pay

## 2021-11-05 ENCOUNTER — Other Ambulatory Visit (HOSPITAL_BASED_OUTPATIENT_CLINIC_OR_DEPARTMENT_OTHER): Payer: Self-pay

## 2021-11-09 ENCOUNTER — Telehealth: Payer: Self-pay

## 2021-11-12 ENCOUNTER — Telehealth: Payer: Self-pay | Admitting: Family Medicine

## 2021-11-12 ENCOUNTER — Ambulatory Visit (INDEPENDENT_AMBULATORY_CARE_PROVIDER_SITE_OTHER): Payer: No Typology Code available for payment source | Admitting: Family Medicine

## 2021-11-12 ENCOUNTER — Encounter: Payer: Self-pay | Admitting: Family Medicine

## 2021-11-12 VITALS — BP 122/76 | HR 90 | Temp 98.1°F | Resp 16 | Ht 69.0 in

## 2021-11-12 DIAGNOSIS — Z0000 Encounter for general adult medical examination without abnormal findings: Secondary | ICD-10-CM | POA: Diagnosis not present

## 2021-11-12 DIAGNOSIS — E063 Autoimmune thyroiditis: Secondary | ICD-10-CM

## 2021-11-12 DIAGNOSIS — F419 Anxiety disorder, unspecified: Secondary | ICD-10-CM | POA: Diagnosis not present

## 2021-11-12 DIAGNOSIS — F32A Depression, unspecified: Secondary | ICD-10-CM

## 2021-11-12 DIAGNOSIS — E109 Type 1 diabetes mellitus without complications: Secondary | ICD-10-CM

## 2021-11-12 LAB — CBC WITH DIFFERENTIAL/PLATELET
Basophils Absolute: 0.1 10*3/uL (ref 0.0–0.1)
Basophils Relative: 0.9 % (ref 0.0–3.0)
Eosinophils Absolute: 0.1 10*3/uL (ref 0.0–0.7)
Eosinophils Relative: 1.6 % (ref 0.0–5.0)
HCT: 43.4 % (ref 39.0–52.0)
Hemoglobin: 14.7 g/dL (ref 13.0–17.0)
Lymphocytes Relative: 38.7 % (ref 12.0–46.0)
Lymphs Abs: 2.7 10*3/uL (ref 0.7–4.0)
MCHC: 33.8 g/dL (ref 30.0–36.0)
MCV: 93 fl (ref 78.0–100.0)
Monocytes Absolute: 0.8 10*3/uL (ref 0.1–1.0)
Monocytes Relative: 11.1 % (ref 3.0–12.0)
Neutro Abs: 3.3 10*3/uL (ref 1.4–7.7)
Neutrophils Relative %: 47.7 % (ref 43.0–77.0)
Platelets: 246 10*3/uL (ref 150.0–400.0)
RBC: 4.67 Mil/uL (ref 4.22–5.81)
RDW: 12.9 % (ref 11.5–15.5)
WBC: 6.9 10*3/uL (ref 4.0–10.5)

## 2021-11-12 LAB — COMPREHENSIVE METABOLIC PANEL
ALT: 13 U/L (ref 0–53)
AST: 24 U/L (ref 0–37)
Albumin: 4.9 g/dL (ref 3.5–5.2)
Alkaline Phosphatase: 69 U/L (ref 39–117)
BUN: 12 mg/dL (ref 6–23)
CO2: 32 mEq/L (ref 19–32)
Calcium: 9.8 mg/dL (ref 8.4–10.5)
Chloride: 102 mEq/L (ref 96–112)
Creatinine, Ser: 1.03 mg/dL (ref 0.40–1.50)
GFR: 93.84 mL/min (ref >60.00)
Glucose, Bld: 43 mg/dL — CL (ref 70–99)
Potassium: 3.8 mEq/L (ref 3.5–5.1)
Sodium: 141 mEq/L (ref 135–145)
Total Bilirubin: 0.8 mg/dL (ref 0.2–1.2)
Total Protein: 7.6 g/dL (ref 6.0–8.3)

## 2021-11-12 LAB — T4, FREE: Free T4: 0.76 ng/dL (ref 0.60–1.60)

## 2021-11-12 LAB — TSH: TSH: 4.3 u[IU]/mL (ref 0.35–5.50)

## 2021-11-12 NOTE — Progress Notes (Signed)
Phone: [phone]    Subjective:  Patient presents today for their annual physical. Chief complaint-noted.   See problem oriented charting- ROS- full  review of systems was completed and negative  except for: concentration difficulty, sad mood, anxiety  The following were reviewed and entered/updated in epic: Past Medical History:  Diagnosis Date   Chicken pox    Depression    Diabetes mellitus without complication ([place])    Eating disorder    Patient Active Problem List   Diagnosis Date Noted   Diabetes type 1, controlled ([place]) 01/07/2013    Priority: High   Hashimoto's thyroiditis 06/26/2015    Priority: Medium    Anxiety and depression 01/07/2013    Priority: Medium    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    Priority: Low   GERD (gastroesophageal reflux disease) 01/07/2013    Priority: Low   History of anorexia nervosa 01/07/2013    Priority: Low   Chronic insomnia 09/01/2020   Other specified joint disorders, left hip 03/22/2019   Past Surgical History:  Procedure Laterality Date   APPENDECTOMY     LASIK     2013 or 2014   WISDOM TOOTH EXTRACTION      Family History  Problem Relation Age of Onset   Thyroid disease Mother    Hyperlipidemia Mother    Depression Mother    GER disease Mother    Hypertension Mother    Other Mother        prediabetes   Hyperlipidemia Father    Alcohol abuse Maternal Grandmother    Diabetes Maternal Grandmother    Cancer Maternal Grandfather        colon and prostate. age 73 in 2018   Stroke Maternal Grandfather        in 20s   Dementia Maternal Grandfather        vascular   Healthy Sister     Medications- reviewed and updated Current Outpatient Medications  Medication Sig Dispense Refill   Cholecalciferol (VITAMIN D) 50 MCG (2000 UT) CAPS Take by mouth.     Glucagon (GVOKE HYPOPEN 1-PACK) 1 MG/0.2ML SOAJ Use as directed for low blood sugar. 0.4 mL PRN   Glucagon, rDNA, (GLUCAGON EMERGENCY) 1  MG KIT Inject 1 mg as directed daily.     insulin degludec (TRESIBA FLEXTOUCH) 100 UNIT/ML SOPN FlexTouch Pen Inject 0.2-0.26 mLs (20-26 Units total) into the skin daily. 5 pen 5   Insulin Disposable Pump (OMNIPOD 5 G6 POD, GEN 5,) MISC Use every other day. 15 each 11   insulin lispro (HUMALOG) 100 UNIT/ML injection Inject up to 75 units into the pump daily as advised 30 mL 3   Insulin Pen Needle (BD PEN NEEDLE NANO U/F) 32G X 4 MM MISC Use 4x a day 200 each 11   No current facility-administered medications for this visit.    Allergies-reviewed and updated Allergies  Allergen Reactions   Penicillins Rash   Penicillin G Nausea And Vomiting    Social History   Social History Narrative   Married. Daughter 22 years old Lorre Nick and Dian Situ 64 almost 36 year old in 2022. One child passed early -after birth.       FNP endocrinology   App undergrad- graduated [employer]. Masters at [employer].       Regular exercise: yes, biking, run, weight lifting- back to crossfit, mountain biking- slow on this compared to previous with neck      Caffeine use: daily      Objective:  BP 122/76   Pulse 90   Temp 98.1 F (36.7 C) (Oral)   Resp 16   Ht 5' 9"  (1.753 m)   SpO2 99%   BMI 25.55 kg/m  Gen: NAD, resting comfortably HEENT: Mucous membranes are moist. Oropharynx normal Neck: no thyromegaly CV: RRR no murmurs rubs or gallops Lungs: CTAB no crackles, wheeze, rhonchi Abdomen: soft/nontender/nondistended/normal bowel sounds. No rebound or guarding.  Ext: no edema Skin: warm, dry Neuro: grossly normal, moves all extremities, PERRLA  DM foot exam deferred to next year to get him to lab on time     Assessment and Plan:  36 y.o. male presenting for annual physical.  Health Maintenance counseling: 1. Anticipatory guidance: Patient counseled regarding regular dental exams -q6 months, eye exams - yearly,  avoiding smoking and second hand smoke , limiting alcohol to 2 beverages per day, no illicit drugs.   2.  Risk factor reduction:  Advised patient of need for regular exercise and diet rich and fruits and vegetables to reduce risk of heart attack and stroke.  Exercise- mainly in home gym crossfit type workouts- 6-7 days a week.  Diet/weight management-healthy weight- eating reasonably healthy- 1200-1500 calories more lately- glad he has pushed this up some- would love for him to push up higher- psychiatry may also be of benefit there.  Wt Readings from Last 3 Encounters:  04/16/21 173 lb (78.5 kg)  10/16/20 175 lb 3.2 oz (79.5 kg)  09/01/20 174 lb (78.9 kg)  3. Immunizations/screenings/ancillary studies- could receive prevnar 20 - opts out for now, holding off on further covid shots - did not tolerate 3rd shot well Immunization History  Administered Date(s) Administered   Influenza-Unspecified 02/14/2012, 02/12/2013, 02/13/2017   PFIZER(Purple Top)SARS-COV-2 Vaccination 03/25/2020   Pneumococcal Polysaccharide-23 05/24/2012   Tdap 03/26/2008, 11/29/2018   4. Prostate cancer screening-  no family history of younger prostate cancer, start at age 45. Grandfather in 90s  5. Colon cancer screening - no family history, start at age 24. Grandfather had diverticulitis and mom.  6. Skin cancer screening/prevention- seen about a year ago. advised regular sunscreen use. Denies worrisome, changing, or new skin lesions.  7. Testicular cancer screening- advised monthly self exams  8. STD screening- patient opts out- only active with wife 62. Smoking associated screening- Never smoker  Status of chronic or acute concerns   # Depression and anxiety S: Medication: none at present  -Started around college after wife's mom committed suicide- also dealt with anorexia issues which worsened wafter that. Later lost Clark early in life.  -first tried wellbutrin and effexor in college which worsened symptoms - has tried zoloft  up to 150 mg, prozac, lexapro 15 mg- but on each ends up getting GI upset. Most recently  retried prozac just 10 mg but with GI upset    11/12/2021    1:15 PM 11/05/2020   12:57 PM 11/29/2018    4:15 PM  Depression screen PHQ 2/9  Decreased Interest 1 0 1  Down, Depressed, Hopeless 2 0 2  PHQ - 2 Score 3 0 3  Altered sleeping 3 0 3  Tired, decreased energy 0 0 1  Change in appetite 0 0 1  Feeling bad or failure about yourself  1 0 1  Trouble concentrating 2 1 1   Moving slowly or fidgety/restless 0 0 1  Suicidal thoughts 0 0 0  PHQ-9 Score 9 1 11   Difficult doing work/chores Somewhat difficult Not difficult at all Somewhat difficult  A/P: Poor control of  depression- gets benefit from SSRI but does not tolerate GI effects- will refer to psychiatry to see if they have other options which may help - see avs -aware of 988 if thoughts of self harm  # Diabetes Type I S: Medication: insulin pump tandem t slim and dexcom g6 for monitoring  CBGs- infrequent lows- almost never under 60  Lab Results  Component Value Date   HGBA1C 6.0 (A) 10/15/2021   HGBA1C 5.7 (A) 10/16/2020   HGBA1C 5.7 08/05/2019   A/P: excellent control- continue follow up with Dr. Cruzita Lederer  #hashimoto's thyroiditis- still actively monitoring but has not needed therapy yet. Continue to monitor Lab Results  Component Value Date   TSH 3.70 06/18/2021   Recommended follow up: Return in about 1 year (around 11/13/2022) for physical or sooner if needed.Schedule b4 you leave. Future Appointments  Date Time Provider [place]  12/01/2021  9:45 AM Ardelle Lesches, RD [place] [facility]  04/15/2022 11:00 AM Philemon Kingdom, MD [facility] None   Lab/Order associations: NOT fasting   ICD-10-CM   1. Preventative health care  Z00.00 Ambulatory referral to Psychiatry    CBC with Differential/Platelet    Comprehensive metabolic panel    TSH    T4, free    2. Anxiety and depression  F41.9 Ambulatory referral to Psychiatry   F32.A     3. Hashimoto's thyroiditis  E06.3 TSH    T4, free    4. Controlled  diabetes mellitus type 1 without complications (HCC)  F84.0 CBC with Differential/Platelet    Comprehensive metabolic panel      No orders of the defined types were placed in this encounter.   Return precautions advised.   Garret Reddish, MD

## 2021-11-12 NOTE — Assessment & Plan Note (Signed)
S: Medication: none at present  -Started around college after wife's mom committed suicide- also dealt with anorexia issues which worsened wafter that. Later lost Clark early in life.  -first tried wellbutrin and effexor in college which worsened symptoms - has tried zoloft  up to 150 mg, prozac, lexapro 15 mg- but on each ends up getting GI upset. Most recently retried prozac just 10 mg but with GI upset    11/12/2021    1:15 PM 11/05/2020   12:57 PM 11/29/2018    4:15 PM  Depression screen PHQ 2/9  Decreased Interest 1 0 1  Down, Depressed, Hopeless 2 0 2  PHQ - 2 Score 3 0 3  Altered sleeping 3 0 3  Tired, decreased energy 0 0 1  Change in appetite 0 0 1  Feeling bad or failure about yourself  1 0 1  Trouble concentrating 2 1 1   Moving slowly or fidgety/restless 0 0 1  Suicidal thoughts 0 0 0  PHQ-9 Score 9 1 11   Difficult doing work/chores Somewhat difficult Not difficult at all Somewhat difficult  A/P: Poor control of depression- gets benefit from SSRI but does not tolerate GI effects- will refer to psychiatry to see if they have other options which may help

## 2021-11-12 NOTE — Patient Instructions (Addendum)
We will call you within two weeks about your referral to [place] [facility]. If you do not hear within 2 weeks, give [place] a call.  - another option: [email or web address]   Please stop by lab before you go If you have mychart- we will send your results within 3 business days of [place] receiving them.  If you do not have mychart- we will call you about results within 5 business days of [place] receiving them.  *please also note that you will see labs on mychart as soon as they post. I will later go in and write notes on them- will say "notes from Dr. Durene Cal"   Recommended follow up: Return in about 1 year (around 11/13/2022) for physical or sooner if needed.Schedule b4 you leave.

## 2021-11-12 NOTE — Telephone Encounter (Signed)
Called patient.  He did not pick up.  Called wife-she confirms he is home and was able to correct his sugar.  Noted as 43 on our labs-we have given him food to help correct-he had noted at 75 before going to lab and also had some glucose tablets in the [place]

## 2021-11-12 NOTE — Telephone Encounter (Signed)
Critical Lab:  Received from [facility] lab Time 4:45 Glucose:43

## 2021-11-15 ENCOUNTER — Encounter: Payer: Self-pay | Admitting: Internal Medicine

## 2021-11-17 ENCOUNTER — Other Ambulatory Visit: Payer: Self-pay | Admitting: Internal Medicine

## 2021-11-17 DIAGNOSIS — E063 Autoimmune thyroiditis: Secondary | ICD-10-CM

## 2021-11-18 ENCOUNTER — Other Ambulatory Visit (HOSPITAL_COMMUNITY): Payer: Self-pay

## 2021-11-18 MED ORDER — LAMOTRIGINE 25 MG PO TABS
25.0000 mg | ORAL_TABLET | Freq: Every evening | ORAL | 0 refills | Status: DC
  Filled 2021-11-18 – 2021-11-19 (×2): qty 15, 15d supply, fill #0

## 2021-11-19 ENCOUNTER — Other Ambulatory Visit (HOSPITAL_COMMUNITY): Payer: Self-pay

## 2021-11-20 ENCOUNTER — Other Ambulatory Visit (HOSPITAL_COMMUNITY): Payer: Self-pay

## 2021-12-01 ENCOUNTER — Ambulatory Visit: Payer: No Typology Code available for payment source | Admitting: Registered"

## 2021-12-21 ENCOUNTER — Encounter: Payer: Self-pay | Admitting: Internal Medicine

## 2021-12-26 ENCOUNTER — Encounter: Payer: Self-pay | Admitting: Family Medicine

## 2021-12-26 DIAGNOSIS — R0989 Other specified symptoms and signs involving the circulatory and respiratory systems: Secondary | ICD-10-CM

## 2021-12-27 ENCOUNTER — Encounter: Payer: Self-pay | Admitting: Family Medicine

## 2021-12-27 ENCOUNTER — Ambulatory Visit (INDEPENDENT_AMBULATORY_CARE_PROVIDER_SITE_OTHER): Payer: No Typology Code available for payment source | Admitting: Family Medicine

## 2021-12-27 ENCOUNTER — Ambulatory Visit
Admission: RE | Admit: 2021-12-27 | Discharge: 2021-12-27 | Disposition: A | Discharge status: Home / Self Care | Payer: No Typology Code available for payment source | Source: Ambulatory Visit | Attending: Family Medicine | Admitting: Family Medicine

## 2021-12-27 ENCOUNTER — Other Ambulatory Visit: Payer: Self-pay

## 2021-12-27 ENCOUNTER — Ambulatory Visit (INDEPENDENT_AMBULATORY_CARE_PROVIDER_SITE_OTHER): Payer: No Typology Code available for payment source | Admitting: "Endocrinology

## 2021-12-27 VITALS — BP 100/72 | HR 65 | Temp 98.2°F | Ht 69.0 in | Wt 177.0 lb

## 2021-12-27 DIAGNOSIS — R062 Wheezing: Secondary | ICD-10-CM | POA: Diagnosis not present

## 2021-12-27 DIAGNOSIS — R0989 Other specified symptoms and signs involving the circulatory and respiratory systems: Secondary | ICD-10-CM

## 2021-12-27 DIAGNOSIS — R0789 Other chest pain: Secondary | ICD-10-CM

## 2021-12-27 NOTE — Progress Notes (Signed)
Phone [phone] In person visit   Subjective:   Mark Barr is a 36 y.o. year old very pleasant male patient who presents for/with See problem oriented charting Chief Complaint  Patient presents with   Wheezing    Pt c/o wheezing and crackles in chest that has started 4-5 days ago, xray has been completed.   Past Medical History-  Patient Active Problem List   Diagnosis Date Noted   Diabetes type 1, controlled ([place]) 01/07/2013    Priority: High   Hashimoto's thyroiditis 06/26/2015    Priority: Medium    Anxiety and depression 01/07/2013    Priority: Medium    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    Priority: Low   GERD (gastroesophageal reflux disease) 01/07/2013    Priority: Low   History of anorexia nervosa 01/07/2013    Priority: Low   Chronic insomnia 09/01/2020   Other specified joint disorders, left hip 03/22/2019    Medications- reviewed and updated Current Outpatient Medications  Medication Sig Dispense Refill   Cholecalciferol (VITAMIN D) 50 MCG (2000 UT) CAPS Take by mouth.     Glucagon (GVOKE HYPOPEN 1-PACK) 1 MG/0.2ML SOAJ Use as directed for low blood sugar. 0.4 mL PRN   Glucagon, rDNA, (GLUCAGON EMERGENCY) 1 MG KIT Inject 1 mg as directed daily.     insulin degludec (TRESIBA FLEXTOUCH) 100 UNIT/ML SOPN FlexTouch Pen Inject 0.2-0.26 mLs (20-26 Units total) into the skin daily. 5 pen 5   Insulin Disposable Pump (OMNIPOD 5 G6 POD, GEN 5,) MISC Use every other day. 15 each 11   insulin lispro (HUMALOG) 100 UNIT/ML injection Inject up to 75 units into the pump daily as advised 30 mL 3   Insulin Pen Needle (BD PEN NEEDLE NANO U/F) 32G X 4 MM MISC Use 4x a day 200 each 11   lamoTRIgine (LAMICTAL) 25 MG tablet Take 1 tablet by mouth every evening. 15 tablet 0   No current facility-administered medications for this visit.     Objective:  BP 100/72   Pulse 65   Temp 98.2 F (36.8 C)   Ht _0  (1.753 m)   Wt 177 lb (80.3 kg)    SpO2 98%   BMI 26.14 kg/m  Gen: NAD, resting comfortably CV: RRR no murmurs rubs or gallops Lungs: After patient beats on his chest-to the right of his sternum there is a expiratory wheezing followed by inspiratory crackles but after a minute or so only hear a rubbing sensation with expiration and wheezes and crackles resolved-rest of the lung fields are normal Ext: no edema Skin: warm, dry     Assessment and Plan   #Wheezing/crackles/rub S: Patient started with wheezing/crackles in his chest about 4 to 5 days ago-he had a friend auscultate as he noted some discomfort in his chest-described in my chart message as "a weird feeling like I have fluid in my chest". Sensation is center right of the sternum and that is where auscultated sounds have been- like arub- noted wheezes in lower lungs at times.  Seems to worsen with time.  Denied fever.  Has slight cough. Almost comes and goes- yesterday from morning to 5-6 really noted it and then started to calm down. Today by afternoon started to feel better even though more prevalent in AM. Today is stable compared to yesterday. Laying on his stomach helps slightly with discomfort. Has not taken any medication. Some chest discomfort today greater than yesterday but was auscultated by several attendings and resident  physicians. Even the sound comes and goes on auscultation- blatantly obvious when it comes.   No trouble swallowing- not related to food  Also had a pretty hard fall on his chest wakeboarding weekend before last- middle of chest. Started several days after  See MyChart messages from yesterday and today as well for further details by patient A/P: 36 year old male with a fall onto his chest wakeboarding weekend before last that started 4 to 5 days ago with a sensation of fluid/discomfort just to the right of his sternum and on auscultation by several medical healthcare providers have noted wheezing specifically in this area as well as a rubbing  sensation and even crackles at times-very unique and I have not heard similar previously.  This sensation comes and goes and can be provoked by pounding on his chest as evidenced today-was not initially present at beginning of visit.  Since not present on auscultation of the heart or without deep breaths and since triggered by beating on the chest by patient-do not think needs cardiac work-up at this time -We did a chest x-ray earlier today and no obvious pneumonia or pneumothorax or hemothorax or effusion - For now we opted to monitor unless he has worsening symptoms or concerning findings on chest x-ray over read - Asked him to update me in 1 week with progress - Discussed if worsening or persistent symptoms consider chest CT-wonder about pulmonary contusion or injury related to hard fall while wakeboarding -See after visit summary as well -with wheezing being so pinpoint- doubt inhaler would be helpful. Did discuss considering incentive spirometer (ok to wait on CXR result)   #Depression-saw [facility] and they recommended trying alternate of Lamictal-he is not interested in staying on this and only took it once or twice-at this point prefers to continue to work with his therapist/counselor  Recommended follow up: Asked him for an update in 1 week unless worsening symptoms Future Appointments  Date Time Provider [place]  03/08/2022  8:30 AM Pyrtle, Lajuan Lines, MD [facility]  04/15/2022 11:00 AM Philemon Kingdom, MD [facility] None    Lab/Order associations:   ICD-10-CM   1. Wheezing  R06.2     2. Chest crackles  R09.89       Time Spent: 25 minutes of total time (5:10 PM-5:35 PM) was spent on the date of the encounter performing the following actions: chart review prior to seeing the patient, obtaining history, performing a medically necessary exam, counseling on the work-up plan-no current treatment plan until we obtain more information , placing orders, and  documenting in our EHR.   Return precautions advised.  Garret Reddish, MD

## 2021-12-27 NOTE — Patient Instructions (Addendum)
Flu shot- we should have these available within a month or two but please let us know if you get at outside pharmacy  Please update me in 1 week-we may need sooner intervention depending on x-ray read.  If worsening or persistent symptoms consider CT of the chest.

## 2021-12-27 NOTE — Telephone Encounter (Signed)
Patient is calling back in regard.  Is requesting call back at [phone] with what Dr. Durene Cal believes she should do for treatment today.

## 2021-12-27 NOTE — Telephone Encounter (Signed)
Patient requests to either be called at ph# [phone] or sent a MyChart message regarding if Dr. Durene Cal will order a chest Xray for Patient or should Patient have one of his other Physicians order it?

## 2021-12-27 NOTE — Telephone Encounter (Signed)
Please call and place pt on the schedule to be worked in at end of day today per Dr. Durene Cal. Please inform patient Dr. Durene Cal may be running behind since we are working him in.

## 2021-12-28 LAB — HM DIABETES EYE EXAM

## 2021-12-29 ENCOUNTER — Other Ambulatory Visit: Payer: Self-pay | Admitting: Internal Medicine

## 2021-12-29 ENCOUNTER — Ambulatory Visit (HOSPITAL_COMMUNITY)
Admission: RE | Admit: 2021-12-29 | Discharge: 2021-12-29 | Disposition: A | Discharge status: Home / Self Care | Payer: No Typology Code available for payment source | Source: Ambulatory Visit | Attending: Internal Medicine | Admitting: Internal Medicine

## 2021-12-29 ENCOUNTER — Ambulatory Visit (HOSPITAL_BASED_OUTPATIENT_CLINIC_OR_DEPARTMENT_OTHER)
Admission: RE | Admit: 2021-12-29 | Discharge: 2021-12-29 | Disposition: A | Discharge status: Home / Self Care | Payer: No Typology Code available for payment source | Source: Ambulatory Visit | Attending: Internal Medicine | Admitting: Internal Medicine

## 2021-12-29 ENCOUNTER — Encounter (HOSPITAL_COMMUNITY): Payer: Self-pay | Admitting: Internal Medicine

## 2021-12-29 ENCOUNTER — Encounter (HOSPITAL_BASED_OUTPATIENT_CLINIC_OR_DEPARTMENT_OTHER): Payer: Self-pay

## 2021-12-29 ENCOUNTER — Other Ambulatory Visit (HOSPITAL_COMMUNITY): Payer: Self-pay

## 2021-12-29 VITALS — BP 102/78 | HR 84 | Wt 173.8 lb

## 2021-12-29 DIAGNOSIS — R079 Chest pain, unspecified: Secondary | ICD-10-CM

## 2021-12-29 DIAGNOSIS — I5022 Chronic systolic (congestive) heart failure: Secondary | ICD-10-CM

## 2021-12-29 DIAGNOSIS — S27329A Contusion of lung, unspecified, initial encounter: Secondary | ICD-10-CM

## 2021-12-29 DIAGNOSIS — R0609 Other forms of dyspnea: Secondary | ICD-10-CM | POA: Insufficient documentation

## 2021-12-29 DIAGNOSIS — Y9317 Activity, water skiing and wake boarding: Secondary | ICD-10-CM | POA: Diagnosis not present

## 2021-12-29 DIAGNOSIS — E119 Type 2 diabetes mellitus without complications: Secondary | ICD-10-CM | POA: Diagnosis not present

## 2021-12-29 LAB — CBC
HCT: 44.3 % (ref 39.0–52.0)
Hemoglobin: 15.1 g/dL (ref 13.0–17.0)
MCH: 31.2 pg (ref 26.0–34.0)
MCHC: 34.1 g/dL (ref 30.0–36.0)
MCV: 91.5 fL (ref 80.0–100.0)
Platelets: 251 10*3/uL (ref 150–400)
RBC: 4.84 MIL/uL (ref 4.22–5.81)
RDW: 11.9 % (ref 11.5–15.5)
WBC: 5.6 10*3/uL (ref 4.0–10.5)
nRBC: 0 % (ref 0.0–0.2)

## 2021-12-29 LAB — BASIC METABOLIC PANEL
Anion gap: 6 (ref 5–15)
BUN: 8 mg/dL (ref 6–20)
CO2: 28 mmol/L (ref 22–32)
Calcium: 9.4 mg/dL (ref 8.9–10.3)
Chloride: 106 mmol/L (ref 98–111)
Creatinine, Ser: 1.04 mg/dL (ref 0.61–1.24)
GFR, Estimated: >60 mL/min (ref >60)
Glucose, Bld: 99 mg/dL (ref 70–99)
Potassium: 4 mmol/L (ref 3.5–5.1)
Sodium: 140 mmol/L (ref 135–145)

## 2021-12-29 LAB — SEDIMENTATION RATE: Sed Rate: 2 mm/hr (ref 0–16)

## 2021-12-29 LAB — ECHOCARDIOGRAM COMPLETE
Area-P 1/2: 3.03 cm2
S' Lateral: 2.8 cm

## 2021-12-29 MED ORDER — IOHEXOL 300 MG/ML  SOLN
100.0000 mL | Freq: Once | INTRAMUSCULAR | Status: AC | PRN
  Administered 2021-12-29: 75 mL via INTRAVENOUS

## 2021-12-29 NOTE — Patient Instructions (Signed)
Keep taking ibuprofen on for the pain   Labs done today, your results will be available in MyChart, we will contact you for abnormal readings.  Non-Cardiac CT scanning, (CAT scanning), is a noninvasive, special x-ray that produces cross-sectional images of the body using x-rays and a computer. CT scans help physicians diagnose and treat medical conditions. For some CT exams, a contrast material is used to enhance visibility in the area of the body being studied. CT scans provide greater clarity and reveal more details than regular x-ray exams.  If you have any questions or concerns before your next appointment please send [place] a message through [place] or call our office at [phone].    TO LEAVE A MESSAGE FOR THE NURSE SELECT OPTION 2, PLEASE LEAVE A MESSAGE INCLUDING: YOUR NAME DATE OF BIRTH CALL BACK NUMBER REASON FOR CALL**this is important as we prioritize the call backs  YOU WILL RECEIVE A CALL BACK THE SAME DAY AS LONG AS YOU CALL BEFORE 4:00 PM  At the [facility], you and your health needs are our priority. As part of our continuing mission to provide you with exceptional heart care, we have created designated Provider Care Teams. These Care Teams include your primary Cardiologist (physician) and Advanced Practice Providers (APPs- Physician Assistants and Nurse Practitioners) who all work together to provide you with the care you need, when you need it.   You may see any of the following providers on your designated Care Team at your next follow up: Dr Arvilla Meres Dr Carron Curie, NP Robbie Lis, [place] [facility] [place], [place] Karle Plumber, PharmD   Please be sure to bring in all your medications bottles to every appointment.

## 2021-12-29 NOTE — Progress Notes (Signed)
CARDIOLOGY CLINIC CONSULT NOTE  Referring Physician: Marin Olp, MD Primary Care: Marin Olp, MD Primary Cardiologist: None  HPI:  Mark Barr is a 36 y/o male with DM1 who presents for evaluation of chest pain.   Denies any cardiac history. Very active at baseline without problems.   Last Saturday.fell on his chest while wakeboarding. Got wind knocked of him. No pain. Thursday developed an audible pulmonary friction rub.Pleuritic pain progressively worse over past 2 days. Mild dry cough. No hemoptysis No fevers/chills. No long trips or leg swelling. Mild exertional dyspnea  Saw PCP. CXR negative.    Review of Systems: [y] = yes, [ ]  = no   General: Weight gain [ ] ; Weight loss [ ] ; Anorexia [ ] ; Fatigue [ ] ; Fever [ ] ; Chills [ ] ; Weakness [ ]   Cardiac: Chest pain/pressure [ y]; Resting SOB [ ] ; Exertional SOB [ y]; Orthopnea [ ] ; Pedal Edema [ ] ; Palpitations [ ] ; Syncope [ ] ; Presyncope [ ] ; Paroxysmal nocturnal dyspnea[ ]   Pulmonary: Cough Blue.Reese ]; Wheezing[ ] ; Hemoptysis[ ] ; Sputum [ ] ; Snoring [ ]   GI: Vomiting[ ] ; Dysphagia[ ] ; Melena[ ] ; Hematochezia [ ] ; Heartburn[ ] ; Abdominal pain [ ] ; Constipation [ ] ; Diarrhea [ ] ; BRBPR [ ]   GU: Hematuria[ ] ; Dysuria [ ] ; Nocturia[ ]   Vascular: Pain in legs with walking [ ] ; Pain in feet with lying flat [ ] ; Non-healing sores [ ] ; Stroke [ ] ; TIA [ ] ; Slurred speech [ ] ;  Neuro: Headaches[ ] ; Vertigo[ ] ; Seizures[ ] ; Paresthesias[ ] ;Blurred vision [ ] ; Diplopia [ ] ; Vision changes [ ]   Ortho/Skin: Arthritis [ ] ; Joint pain [ ] ; Muscle pain [ ] ; Joint swelling [ ] ; Back Pain [ ] ; Rash [ ]   Psych: Depression[ ] ; Anxiety[ ]   Heme: Bleeding problems [ ] ; Clotting disorders [ ] ; Anemia [ ]   Endocrine: Diabetes [ y]; Thyroid dysfunction[ ]    Past Medical History:  Diagnosis Date   Chicken pox    Depression    Diabetes mellitus without complication (HCC)    Eating disorder     Current Outpatient Medications  Medication Sig  Dispense Refill   Cholecalciferol (VITAMIN D) 50 MCG (2000 UT) CAPS Take by mouth.     Glucagon (GVOKE HYPOPEN 1-PACK) 1 MG/0.2ML SOAJ Use as directed for low blood sugar. 0.4 mL PRN   Glucagon, rDNA, (GLUCAGON EMERGENCY) 1 MG KIT Inject 1 mg as directed daily.     insulin degludec (TRESIBA FLEXTOUCH) 100 UNIT/ML SOPN FlexTouch Pen Inject 0.2-0.26 mLs (20-26 Units total) into the skin daily. 5 pen 5   Insulin Disposable Pump (OMNIPOD 5 G6 POD, GEN 5,) MISC Use every other day. 15 each 11   insulin lispro (HUMALOG) 100 UNIT/ML injection Inject up to 75 units into the pump daily as advised 30 mL 3   Insulin Pen Needle (BD PEN NEEDLE NANO U/F) 32G X 4 MM MISC Use 4x a day 200 each 11   No current facility-administered medications for this encounter.    Allergies  Allergen Reactions   Penicillins Rash   Penicillin G Nausea And Vomiting      Social History   Socioeconomic History   Marital status: Married    Spouse name: Not on file   Number of children: Not on file   Years of education: Not on file   Highest education level: Not on file  Occupational History   Occupation: Nurse Practitioner    Employer: [employer]  Tobacco Use  Smoking status: Never   Smokeless tobacco: Never  Vaping Use   Vaping Use: Never used  Substance and Sexual Activity   Alcohol use: No   Drug use: No   Sexual activity: Yes  Other Topics Concern   Not on file  Social History Narrative   Married. Daughter 56 years old Lorre Nick and Dian Situ 2 almost 36 year old in 2022. One child passed early -after birth.       FNP endocrinology   App undergrad- graduated [employer]. Masters at [employer].       Regular exercise: yes, biking, run, weight lifting- back to crossfit, mountain biking- slow on this compared to previous with neck      Caffeine use: daily   Social Determinants of Health   Financial Resource Strain: Not on file  Food Insecurity: Not on file  Transportation Needs: Not on file  Physical Activity: Not on  file  Stress: Not on file  Social Connections: Not on file  Intimate Partner Violence: Not on file      Family History  Problem Relation Age of Onset   Thyroid disease Mother    Hyperlipidemia Mother    Depression Mother    GER disease Mother    Hypertension Mother    Other Mother        prediabetes   Hyperlipidemia Father    Alcohol abuse Maternal Grandmother    Diabetes Maternal Grandmother    Cancer Maternal Grandfather        colon and prostate. age 70 in 2018   Stroke Maternal Grandfather        in 14s   Dementia Maternal Grandfather        vascular   Healthy Sister     Vitals:   12/29/21 1202  BP: 102/78  Pulse: 84  SpO2: 96%  Weight: 78.8 kg (173 lb 12.8 oz)    PHYSICAL EXAM: General:  Well appearing. No respiratory difficulty HEENT: normal Neck: supple. no JVD. Carotids 2+ bilat; no bruits. No lymphadenopathy or thryomegaly appreciated. Cor: PMI nondisplaced. Regular rate & rhythm. No rubs, gallops or murmurs. Lungs: clear with clear pulmonary friction rub anteriorly over sternum Abdomen: soft, nontender, nondistended. No hepatosplenomegaly. No bruits or masses. Good bowel sounds. Extremities: no cyanosis, clubbing, rash, edema Neuro: alert & oriented x 3, cranial nerves grossly intact. moves all 4 extremities w/o difficulty. Affect pleasant.  ECG: NSR 72 No ST-T wave abnormalities.   Echo EF 60-65 no effusion Personally reviewed   ASSESSMENT & PLAN:  1. Chest pain with pulmonary friction rub on exam - suspect pulmonary contusion - will check chest CT with contrast - continue NSAIDs (with PPI) - Check CBC, BMET, ESR - D/w Dr. Carlis Abbott in Pulmonary as well.   Mark Bickers, MD  1:10 PM

## 2021-12-30 ENCOUNTER — Other Ambulatory Visit (HOSPITAL_BASED_OUTPATIENT_CLINIC_OR_DEPARTMENT_OTHER): Payer: Self-pay

## 2021-12-30 MED ORDER — INSULIN LISPRO 100 UNIT/ML IJ SOLN
75.0000 [IU] | Freq: Every day | INTRAMUSCULAR | 3 refills | Status: DC
Start: 2021-12-30 — End: 2022-05-18
  Filled 2021-12-30: qty 30, 40d supply, fill #0
  Filled 2022-02-03: qty 30, 40d supply, fill #1
  Filled 2022-03-15: qty 30, 40d supply, fill #2
  Filled 2022-04-22: qty 30, 40d supply, fill #3

## 2022-02-01 NOTE — Progress Notes (Addendum)
[facility] Note  02/02/2022     CHIEF COMPLAINT Patient presents for Retina Evaluation   HISTORY OF PRESENT ILLNESS: Mark Barr is a 36 y.o. male who presents to the clinic today for:   HPI     Retina Evaluation   In both eyes.  This started 33 years ago.  Duration of 5 days.  I, the attending physician,  performed the HPI with the patient and updated documentation appropriately.        Comments   New pt here for BRVO vs CRVO OS. Referred by Dr. Ellie Lunch. Pt is diabetic and reports being diabetic for at least 30 years. Always reporting excellent control of his sugar levels. Last A1C checked was 5.7. Does not wear corrective lenses.       Last edited by Bernarda Caffey, MD on 02/02/2022 12:26 PM.    Pt is here on the referral of Dr. Ellie Lunch for concern of BRVO / CRVO OS, pt is a type 1 diabetic for 30 years, he states his blood sugars have always been well controlled, pt had lasik with Dr. Lucita Ferrara  Referring physician: Luberta Mutter, MD [place],  [place] [address]  HISTORICAL INFORMATION:   Selected notes from the MEDICAL RECORD NUMBER Referred by Dr. Ellie Lunch for concern of CRVO OS LEE:  Ocular Hx- PMH-    CURRENT MEDICATIONS: No current outpatient medications on file. (Ophthalmic Drugs)   No current facility-administered medications for this visit. (Ophthalmic Drugs)   Current Outpatient Medications (Other)  Medication Sig   Glucagon (GVOKE HYPOPEN 1-PACK) 1 MG/0.2ML SOAJ Use as directed for low blood sugar.   Glucagon, rDNA, (GLUCAGON EMERGENCY) 1 MG KIT Inject 1 mg as directed daily.   insulin degludec (TRESIBA FLEXTOUCH) 100 UNIT/ML SOPN FlexTouch Pen Inject 0.2-0.26 mLs (20-26 Units total) into the skin daily.   Insulin Disposable Pump (OMNIPOD 5 G6 POD, GEN 5,) MISC Use every other day.   insulin lispro (HUMALOG) 100 UNIT/ML injection Inject up to 75 units into the pump daily as advised   Insulin Pen Needle  (BD PEN NEEDLE NANO U/F) 32G X 4 MM MISC Use 4x a day   Cholecalciferol (VITAMIN D) 50 MCG (2000 UT) CAPS Take by mouth. (Patient not taking: Reported on 02/02/2022)   No current facility-administered medications for this visit. (Other)   REVIEW OF SYSTEMS: ROS   Positive for: Endocrine, Eyes Negative for: Constitutional, Gastrointestinal, Neurological, Skin, Genitourinary, Musculoskeletal, HENT, Cardiovascular, Respiratory, Psychiatric, Allergic/Imm, Heme/Lymph Last edited by Kingsley Spittle, COT on 02/02/2022  8:14 AM.     ALLERGIES Allergies  Allergen Reactions   Penicillins Rash   Penicillin G Nausea And Vomiting   PAST MEDICAL HISTORY Past Medical History:  Diagnosis Date   Chicken pox    Depression    Diabetes mellitus without complication ([place])    Eating disorder    Past Surgical History:  Procedure Laterality Date   APPENDECTOMY     LASIK     2013 or 2014   WISDOM TOOTH EXTRACTION     FAMILY HISTORY Family History  Problem Relation Age of Onset   Thyroid disease Mother    Hyperlipidemia Mother    Depression Mother    GER disease Mother    Hypertension Mother    Other Mother        prediabetes   Hyperlipidemia Father    Alcohol abuse Maternal Grandmother    Diabetes Maternal Grandmother    Cancer Maternal  Grandfather        colon and prostate. age 10 in 2018   Stroke Maternal Grandfather        in 52s   Dementia Maternal Grandfather        vascular   Healthy Sister    SOCIAL HISTORY Social History   Tobacco Use   Smoking status: Never   Smokeless tobacco: Never  Vaping Use   Vaping Use: Never used  Substance Use Topics   Alcohol use: No   Drug use: No       OPHTHALMIC EXAM:  Base Eye Exam     Visual Acuity (Snellen - Linear)       Right Left   Dist [place] 20/20 -1 20/25   Dist ph [place]  20/25 +2         Tonometry (Tonopen, 8:22 AM)       Right Left   Pressure 12 16         Pupils       Pupils Dark Light Shape React APD    Right PERRL 4 3 Round Brisk None   Left PERRL 4 3 Round Brisk None         Visual Fields (Counting fingers)       Left Right    Full Full         Extraocular Movement       Right Left    Full, Ortho Full, Ortho         Neuro/Psych     Oriented x3: Yes   Mood/Affect: Normal         Dilation     Both eyes: 1.0% Mydriacyl, 2.5% Phenylephrine @ 8:22 AM           Slit Lamp and Fundus Exam     Slit Lamp Exam       Right Left   Lids/Lashes Normal Normal   Conjunctiva/Sclera White and quiet White and quiet   Cornea well healed barely visible lasik flap well healed barely visible lasik flap   Anterior Chamber deep and clear deep and clear   Iris Round and dilated, No NVI Round and dilated, No NVI   Lens Clear Clear   Anterior Vitreous mild syneresis mild syneresis         Fundus Exam       Right Left   Disc Pink and Sharp, +PPP, no NVD Pink and Sharp, +PPP, no NVD   C/D Ratio 0.3 0.2   Macula Flat, Good foveal reflex, rare punctate MA Flat, Good foveal reflex, rare punctate MA   Vessels mild attenuation, mild tortuosity, mild copper wiring, no NV mild tortuosity, mild copper wiring, no NV; +IRMA peripheral ST arcades   Periphery Attached, scattered MA / DBH -- rare Attached, scattered MA / DBH-- small focal cluster ST quad, +IRMA           Refraction     Manifest Refraction       Sphere Cylinder Axis Dist VA   Right       Left -0.50 +1.00 093 20/20           IMAGING AND PROCEDURES  Imaging and Procedures for 02/02/2022  OCT, Retina - OU - Both Eyes       Right Eye Quality was good. Central Foveal Thickness: 286. Progression has no prior data. Findings include normal foveal contour, no IRF, no SRF.   Left Eye Quality was good. Central Foveal Thickness: 298. Progression has no prior data.  Findings include normal foveal contour, no IRF, no SRF.   Notes *Images captured and stored on drive  Diagnosis / Impression:  NFP, no IRF/SRF  OU No DME OU  Clinical management:  See below  Abbreviations: NFP - Normal foveal profile. CME - cystoid macular edema. PED - pigment epithelial detachment. IRF - intraretinal fluid. SRF - subretinal fluid. EZ - ellipsoid zone. ERM - epiretinal membrane. ORA - outer retinal atrophy. ORT - outer retinal tubulation. SRHM - subretinal hyper-reflective material. IRHM - intraretinal hyper-reflective material      Fluorescein Angiography Optos (Transit OS)       Right Eye Progression has no prior data. Early phase findings include microaneurysm. Mid/Late phase findings include microaneurysm (No significant leakage; no NV).   Left Eye Progression has no prior data. Early phase findings include microaneurysm, vascular perfusion defect. Mid/Late phase findings include leakage, microaneurysm, vascular perfusion defect (+IRMA -- peripheral ST arcades; no frank NVE; vascular perfusion defects and +perivascular leakage temporal periphery; late leaking MA).   Notes **Images stored on drive**  Impression: Moderate NPDR OU OD: scattered MA; no significant leakage; no NV OS: +IRMA -- peripheral ST arcades; no frank NVE; vascular perfusion defects and +perivascular leakage temporal periphery; late leaking MA            ASSESSMENT/PLAN:    ICD-10-CM   1. 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   - The need for close monitoring of blood glucose, blood pressure, and serum lipids, avoiding cigarette or any type of tobacco, and the need for long term follow up was also discussed with patient. - exam shows scattered MA OU (OS >  OD); OS w/ focal cluster of DBH temporal periphery and +IRMA - FA (09.20.23) shows MA OU; OS with peripheral nonperfusion and corresponding leakage --  - OCT without diabetic macular edema, both eyes  - discussed findings, prognosis and treatment options with patient via telephone - recommend segmental PRP laser OS to areas of vascular nonperfusion - f/u on Oct 9, 245 pm for DFE/OCT, FA review, and laser PRP OS   Ophthalmic Meds Ordered this visit:  No orders of the defined types were placed in this encounter.    Return in about 19 days (around 02/21/2022) for NPDR w/ peripheral nonperfusion OS - , Dilated Exam, OCT, Laser PRP OS.  There are no Patient Instructions on file for this visit.   Explained the diagnoses, plan, and follow up with the patient and they expressed understanding.  Patient expressed understanding of the importance of proper follow up care.   This document serves as a record of services personally performed by Gardiner Sleeper, MD, PhD. It was created on their behalf by San Jetty. Owens Shark, OA an ophthalmic technician. The creation of this record is the provider's dictation and/or activities during the visit.    Electronically signed by: San Jetty. Owens Shark, [place] 09.19.2023 12:20 AM   Gardiner Sleeper, M.D., Ph.D. Diseases & Surgery of the Retina and Vitreous [facility]  I have reviewed the above documentation for accuracy and completeness, and I agree with the above. Gardiner Sleeper, M.D., Ph.D. 02/05/22 12:20  AM  Abbreviations: M myopia (nearsighted); A astigmatism; H hyperopia (farsighted); P presbyopia; Mrx spectacle prescription;  CTL contact lenses; OD right eye; OS left eye; OU both eyes  XT exotropia; ET esotropia; PEK punctate epithelial keratitis; PEE punctate epithelial erosions; DES dry eye syndrome; MGD meibomian gland dysfunction; ATs artificial tears; PFAT's preservative free artificial tears; [place] nuclear sclerotic cataract; PSC posterior  subcapsular cataract; ERM epi-retinal membrane; PVD posterior vitreous detachment; RD retinal detachment; DM diabetes mellitus; DR diabetic retinopathy; NPDR non-proliferative diabetic retinopathy; PDR proliferative diabetic retinopathy; CSME clinically significant macular edema; DME diabetic macular edema; dbh dot blot hemorrhages; CWS cotton wool spot; POAG primary open angle glaucoma; C/D cup-to-disc ratio; HVF humphrey visual field; GVF goldmann visual field; OCT optical coherence tomography; IOP intraocular pressure; BRVO Branch retinal vein occlusion; CRVO central retinal vein occlusion; CRAO central retinal artery occlusion; BRAO branch retinal artery occlusion; RT retinal tear; SB scleral buckle; PPV pars plana vitrectomy; VH Vitreous hemorrhage; PRP panretinal laser photocoagulation; IVK intravitreal kenalog; VMT vitreomacular traction; MH Macular hole;  NVD neovascularization of the disc; NVE neovascularization elsewhere; AREDS age related eye disease study; ARMD age related macular degeneration; POAG primary open angle glaucoma; EBMD epithelial/anterior basement membrane dystrophy; ACIOL anterior chamber intraocular lens; IOL intraocular lens; PCIOL posterior chamber intraocular lens; Phaco/IOL phacoemulsification with intraocular lens placement; [place] photorefractive keratectomy; LASIK laser assisted in situ keratomileusis; HTN hypertension; DM diabetes mellitus; COPD chronic obstructive pulmonary disease

## 2022-02-02 ENCOUNTER — Ambulatory Visit (INDEPENDENT_AMBULATORY_CARE_PROVIDER_SITE_OTHER): Payer: No Typology Code available for payment source | Admitting: Ophthalmology

## 2022-02-02 ENCOUNTER — Encounter (INDEPENDENT_AMBULATORY_CARE_PROVIDER_SITE_OTHER): Payer: Self-pay | Admitting: Ophthalmology

## 2022-02-02 DIAGNOSIS — E103213 Type 1 diabetes mellitus with mild nonproliferative diabetic retinopathy with macular edema, bilateral: Secondary | ICD-10-CM

## 2022-02-02 DIAGNOSIS — E103293 Type 1 diabetes mellitus with mild nonproliferative diabetic retinopathy without macular edema, bilateral: Secondary | ICD-10-CM | POA: Diagnosis not present

## 2022-02-02 DIAGNOSIS — H3581 Retinal edema: Secondary | ICD-10-CM

## 2022-02-03 ENCOUNTER — Other Ambulatory Visit (HOSPITAL_BASED_OUTPATIENT_CLINIC_OR_DEPARTMENT_OTHER): Payer: Self-pay

## 2022-02-04 ENCOUNTER — Encounter (INDEPENDENT_AMBULATORY_CARE_PROVIDER_SITE_OTHER): Payer: Self-pay | Admitting: Ophthalmology

## 2022-02-07 ENCOUNTER — Encounter: Payer: Self-pay | Admitting: *Deleted

## 2022-02-11 ENCOUNTER — Other Ambulatory Visit: Payer: Self-pay

## 2022-02-11 MED ORDER — FLUARIX QUADRIVALENT 0.5 ML IM SUSY
0.5000 mL | PREFILLED_SYRINGE | INTRAMUSCULAR | 0 refills | Status: DC
  Filled 2022-02-11: qty 0.5, 1d supply, fill #0

## 2022-02-18 NOTE — Progress Notes (Signed)
[facility] Note  02/21/2022     CHIEF COMPLAINT Patient presents for Retina Follow Up   HISTORY OF PRESENT ILLNESS: Mark Barr is a 36 y.o. male who presents to the clinic today for:   HPI     Retina Follow Up   Patient presents with  Diabetic Retinopathy.  In both eyes.  This started 2 weeks ago.  I, the attending physician,  performed the HPI with the patient and updated documentation appropriately.        Comments   Patient here for 2 weeks retina follow up for NPDR OU/ PRP OS. Patient states vision doing fine. No eye pain.      Last edited by Bernarda Caffey, MD on 02/21/2022  4:58 PM.    Pt is here for PRP OS today  Referring physician: Luberta Mutter, MD [place],  [place] [address]  HISTORICAL INFORMATION:   Selected notes from the MEDICAL RECORD NUMBER Referred by Dr. Ellie Lunch for concern of CRVO OS LEE:  Ocular Hx- PMH-    CURRENT MEDICATIONS: No current outpatient medications on file. (Ophthalmic Drugs)   No current facility-administered medications for this visit. (Ophthalmic Drugs)   Current Outpatient Medications (Other)  Medication Sig   Glucagon (GVOKE HYPOPEN 1-PACK) 1 MG/0.2ML SOAJ Use as directed for low blood sugar.   Glucagon, rDNA, (GLUCAGON EMERGENCY) 1 MG KIT Inject 1 mg as directed daily.   influenza vac split quadrivalent PF (FLUARIX QUADRIVALENT) 0.5 ML injection Inject 0.5 mLs into the muscle.   insulin degludec (TRESIBA FLEXTOUCH) 100 UNIT/ML SOPN FlexTouch Pen Inject 0.2-0.26 mLs (20-26 Units total) into the skin daily.   Insulin Disposable Pump (OMNIPOD 5 G6 POD, GEN 5,) MISC Use every other day.   insulin lispro (HUMALOG) 100 UNIT/ML injection Inject up to 75 units into the pump daily as advised   Insulin Pen Needle (BD PEN NEEDLE NANO U/F) 32G X 4 MM MISC Use 4x a day   Cholecalciferol (VITAMIN D) 50 MCG (2000 UT) CAPS Take by mouth. (Patient not taking: Reported on 02/02/2022)   No  current facility-administered medications for this visit. (Other)   REVIEW OF SYSTEMS: ROS   Positive for: Endocrine, Eyes Negative for: Constitutional, Gastrointestinal, Neurological, Skin, Genitourinary, Musculoskeletal, HENT, Cardiovascular, Respiratory, Psychiatric, Allergic/Imm, Heme/Lymph Last edited by Theodore Demark, COA on 02/21/2022  2:33 PM.     ALLERGIES Allergies  Allergen Reactions   Penicillins Rash   Penicillin G Nausea And Vomiting   PAST MEDICAL HISTORY Past Medical History:  Diagnosis Date   Chicken pox    Depression    Diabetes mellitus without complication ([place])    Eating disorder    Past Surgical History:  Procedure Laterality Date   APPENDECTOMY     LASIK     2013 or 2014   WISDOM TOOTH EXTRACTION     FAMILY HISTORY Family History  Problem Relation Age of Onset   Thyroid disease Mother    Hyperlipidemia Mother    Depression Mother    GER disease Mother    Hypertension Mother    Other Mother        prediabetes   Hyperlipidemia Father    Alcohol abuse Maternal Grandmother    Diabetes Maternal Grandmother    Cancer Maternal Grandfather        colon and prostate. age 41 in 2018   Stroke Maternal Grandfather        in 68s   Dementia Maternal  Grandfather        vascular   Healthy Sister    SOCIAL HISTORY Social History   Tobacco Use   Smoking status: Never   Smokeless tobacco: Never  Vaping Use   Vaping Use: Never used  Substance Use Topics   Alcohol use: No   Drug use: No       OPHTHALMIC EXAM:  Base Eye Exam     Visual Acuity (Snellen - Linear)       Right Left   Dist [place] 20/20 20/20         Tonometry (Tonopen, 2:31 PM)       Right Left   Pressure 10 10         Pupils       Dark Light Shape React APD   Right 4 3 Round Brisk None   Left 4 3 Round Brisk None         Visual Fields (Counting fingers)       Left Right    Full Full         Extraocular Movement       Right Left    Full, Ortho Full,  Ortho         Neuro/Psych     Oriented x3: Yes   Mood/Affect: Normal         Dilation     Both eyes: 1.0% Mydriacyl, 2.5% Phenylephrine @ 2:31 PM           Slit Lamp and Fundus Exam     Slit Lamp Exam       Right Left   Lids/Lashes Normal Normal   Conjunctiva/Sclera White and quiet White and quiet   Cornea well healed barely visible lasik flap well healed barely visible lasik flap   Anterior Chamber deep and clear deep and clear   Iris Round and dilated, No NVI Round and dilated, No NVI   Lens Clear Clear   Anterior Vitreous mild syneresis mild syneresis         Fundus Exam       Right Left   Disc Pink and Sharp, +PPP, no NVD Pink and Sharp, +PPP, no NVD   C/D Ratio 0.3 0.2   Macula Flat, Good foveal reflex, rare punctate MA Flat, Good foveal reflex, rare punctate MA   Vessels mild attenuation, mild tortuosity, mild copper wiring, no NV mild tortuosity, mild copper wiring, no NV; +IRMA peripheral ST arcades   Periphery Attached, scattered MA / DBH -- rare Attached, scattered MA / DBH-- small focal cluster ST quad, +IRMA           IMAGING AND PROCEDURES  Imaging and Procedures for 02/21/2022  OCT, Retina - OU - Both Eyes       Right Eye Quality was good. Central Foveal Thickness: 285. Progression has been stable. Findings include normal foveal contour, no IRF, no SRF.   Left Eye Quality was good. Central Foveal Thickness: 297. Progression has been stable. Findings include normal foveal contour, no IRF, no SRF.   Notes *Images captured and stored on drive  Diagnosis / Impression:  NFP, no IRF/SRF OU No DME OU  Clinical management:  See below  Abbreviations: NFP - Normal foveal profile. CME - cystoid macular edema. PED - pigment epithelial detachment. IRF - intraretinal fluid. SRF - subretinal fluid. EZ - ellipsoid zone. ERM - epiretinal membrane. ORA - outer retinal atrophy. ORT - outer retinal tubulation. SRHM - subretinal hyper-reflective  material. IRHM - intraretinal hyper-reflective  material      Panretinal Photocoagulation - OS - Left Eye       LASER PROCEDURE NOTE  Diagnosis:   Nonproliferative Diabetic Retinopathy w/ peripheral vascular nonperfusion, LEFT EYE  Procedure:  Segmental pan-retinal photocoagulation using slit lamp laser, LEFT EYE  Anesthesia:  Topical  Surgeon: Bernarda Caffey, MD, PhD   Informed consent obtained, operative eye marked, and time out performed prior to initiation of laser.   Lumenis [id number] slit lamp laser Pattern: 2x2 square Power: 250 mW Duration: 30 msec  Spot size: 200 microns  # spots: 337 spots temporal periphery  Complications: None.  RTC: 4-6 mos for DFE/OCT, repeat FA  Patient tolerated the procedure well and received written and verbal post-procedure care information/education.            ASSESSMENT/PLAN:    ICD-10-CM   1. 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 ([facility])  E10.3293 OCT, Retina - OU - Both Eyes    Panretinal Photocoagulation - OS - Left Eye     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, both eyes - exam shows scattered MA OU (OS > OD); OS w/ focal cluster of DBH temporal periphery and +IRMA - FA (09.20.23) shows MA OU; OS with peripheral nonperfusion and corresponding leakage - OCT without diabetic macular edema, both eyes  - BCVA 20/20 OU - discussed findings, prognosis and treatment options with patient via telephone - recommend segmental PRP laser OS today, 10.09.23 -- to areas of vascular nonperfusion and DBH, temporal periphery - pt wishes to proceed with laser - RBA of procedure discussed, questions answered - informed consent obtained and signed - see procedure note - f/u 4-6 months, DFE, OCT, repeat FA   Ophthalmic Meds Ordered this visit:  No orders of the defined types were placed in this encounter.    Return for f/u 4-6 months, NPDR OU,  DFE, OCT, repeat FA.  There are no Patient Instructions on file for this visit.   Explained the diagnoses, plan, and follow up with the patient and they expressed understanding.  Patient expressed understanding of the importance of proper follow up care.   This document serves as a record of services personally performed by Gardiner Sleeper, MD, PhD. It was created on their behalf by Roselee Nova, COM[place]. The creation of this record is the provider's dictation and/or activities during the visit.  Electronically signed by: Roselee Nova, COMT 02/21/22 5:02 PM  This document serves as a record of services personally performed by Gardiner Sleeper, MD, PhD. It was created on their behalf by San Jetty. Owens Shark, OA an ophthalmic technician. The creation of this record is the provider's dictation and/or activities during the visit.    Electronically signed by: San Jetty. [place], [place] 10.09.2023 5:02 PM  Gardiner Sleeper, M.D., Ph.D. Diseases & Surgery of the Retina and Vitreous [facility]  I have reviewed the above documentation for accuracy and completeness, and I agree with the above. Gardiner Sleeper, M.D., Ph.D. 02/21/22 5:04 PM  Abbreviations: M myopia (nearsighted); A astigmatism; H hyperopia (farsighted); P presbyopia; Mrx spectacle prescription;  CTL contact lenses; OD right eye; OS left eye; OU both eyes  XT exotropia; ET esotropia; PEK punctate epithelial keratitis; PEE punctate epithelial erosions; DES dry eye syndrome; MGD meibomian gland dysfunction; ATs artificial tears; PFAT's preservative free artificial tears; [place] nuclear sclerotic cataract; PSC posterior subcapsular cataract; ERM epi-retinal membrane; PVD posterior vitreous detachment;  RD retinal detachment; DM diabetes mellitus; DR diabetic retinopathy; NPDR non-proliferative diabetic retinopathy; PDR proliferative diabetic retinopathy; CSME clinically significant macular edema; DME diabetic macular edema; dbh dot blot  hemorrhages; CWS cotton wool spot; POAG primary open angle glaucoma; C/D cup-to-disc ratio; HVF humphrey visual field; GVF goldmann visual field; OCT optical coherence tomography; IOP intraocular pressure; BRVO Branch retinal vein occlusion; CRVO central retinal vein occlusion; CRAO central retinal artery occlusion; BRAO branch retinal artery occlusion; RT retinal tear; SB scleral buckle; PPV pars plana vitrectomy; VH Vitreous hemorrhage; PRP panretinal laser photocoagulation; IVK intravitreal kenalog; VMT vitreomacular traction; MH Macular hole;  NVD neovascularization of the disc; NVE neovascularization elsewhere; AREDS age related eye disease study; ARMD age related macular degeneration; POAG primary open angle glaucoma; EBMD epithelial/anterior basement membrane dystrophy; ACIOL anterior chamber intraocular lens; IOL intraocular lens; PCIOL posterior chamber intraocular lens; Phaco/IOL phacoemulsification with intraocular lens placement; [place] photorefractive keratectomy; LASIK laser assisted in situ keratomileusis; HTN hypertension; DM diabetes mellitus; COPD chronic obstructive pulmonary disease

## 2022-02-21 ENCOUNTER — Encounter (INDEPENDENT_AMBULATORY_CARE_PROVIDER_SITE_OTHER): Payer: Self-pay | Admitting: Ophthalmology

## 2022-02-21 ENCOUNTER — Ambulatory Visit (INDEPENDENT_AMBULATORY_CARE_PROVIDER_SITE_OTHER): Payer: No Typology Code available for payment source | Admitting: Ophthalmology

## 2022-02-21 DIAGNOSIS — E103293 Type 1 diabetes mellitus with mild nonproliferative diabetic retinopathy without macular edema, bilateral: Secondary | ICD-10-CM

## 2022-02-28 ENCOUNTER — Encounter: Payer: Self-pay | Admitting: *Deleted

## 2022-03-02 ENCOUNTER — Encounter (INDEPENDENT_AMBULATORY_CARE_PROVIDER_SITE_OTHER): Payer: Self-pay | Admitting: Ophthalmology

## 2022-03-08 ENCOUNTER — Ambulatory Visit: Payer: No Typology Code available for payment source | Admitting: Internal Medicine

## 2022-03-15 ENCOUNTER — Other Ambulatory Visit (HOSPITAL_BASED_OUTPATIENT_CLINIC_OR_DEPARTMENT_OTHER): Payer: Self-pay

## 2022-04-04 ENCOUNTER — Encounter: Payer: Self-pay | Admitting: Family Medicine

## 2022-04-05 NOTE — Telephone Encounter (Signed)
LVM to schedule ov  

## 2022-04-15 ENCOUNTER — Encounter: Payer: Self-pay | Admitting: Internal Medicine

## 2022-04-15 ENCOUNTER — Ambulatory Visit (INDEPENDENT_AMBULATORY_CARE_PROVIDER_SITE_OTHER): Payer: No Typology Code available for payment source | Admitting: Internal Medicine

## 2022-04-15 ENCOUNTER — Ambulatory Visit: Payer: No Typology Code available for payment source | Admitting: Family Medicine

## 2022-04-15 VITALS — BP 118/72 | HR 68 | Ht 69.0 in

## 2022-04-15 DIAGNOSIS — E063 Autoimmune thyroiditis: Secondary | ICD-10-CM | POA: Diagnosis not present

## 2022-04-15 DIAGNOSIS — E109 Type 1 diabetes mellitus without complications: Secondary | ICD-10-CM

## 2022-04-15 NOTE — Progress Notes (Signed)
Patient ID: Mark Barr, male   DOB: 1985-08-07, 36 y.o.   MRN: [id number]  HPI: Mark Barr is a 36 y.o.-year-old male, returning for f/u for DM1, dx 56 (at 36 y/o), controlled, w/o complications, on insulin pump since 2000. Last visit 6 months ago.  Interim history: No increased urination, blurry vision, nausea, chest pain.  Since last visit, he had a fall on his chest while wakeboarding.  He developed a pulmonary contusion and was evaluated by cardiology.  2D echo on 12/29/2021 was normal, with an EF of 60 to 65%.  A CT chest on 12/30/2021 showed changes consistent with pulmonary contusion.  Chest tightness shortness of breath resolved since then. He continues to exercise approximately 1.5 hours a day.  Insulin pump: - Medtronic Revel 530 G loaner - Omnipod since12/09/2013 >> loved it, however he developed a blistering a rash and the attaching site (started to use Flonase before attaching the pump, which helped) - Medtronic 723  - Medtronic 670G since 10/2015 - T:slim X2 since 03/2017.  He is using the control IQ technology. - Omnipod 5 alternating with tandem t:slim pump  -currently on the tandem pump - tried the iLet pump - did not like it   CGM: -Dexcom G6 since 01/2017 -supplies from [employer]  Insulin: -He initially tried NovoLog and Fiasp in the pump, but did not see a difference between the 2 in the 670 G pump.   -After he switched to the T slim pump, he felt Mark Barr was working better for him -However, he is now using Humalog per insurance preference -We tried Lyumjev 04/2020 but this caused burning at the infusion site so he stopped  Reviewed HbA1c levels: Lab Results  Component Value Date   HGBA1C 6.0 (A) 10/15/2021   HGBA1C 5.7 (A) 10/16/2020   HGBA1C 5.7 08/05/2019   HGBA1C 6.4 (A) 11/23/2018   HGBA1C 6.3 04/16/2018   HGBA1C 6.6 (H) 02/26/2016   HGBA1C 6.5 10/01/2015   HGBA1C 7.1 (H) 06/25/2015   HGBA1C 6.8 02/19/2015   HGBA1C 6.5 08/25/2014   HGBA1C 7.2  (H) 04/28/2014   HGBA1C 6.9 (H) 12/24/2013   HGBA1C 6.1 09/16/2013   HGBA1C 6.6 (H) 05/13/2013   HGBA1C 6.9 (H) 02/11/2013  04/16/2021: HbA1c 6.3% 04/10/2018: HbA1c 6.3% 09/15/2017: HbA1c 6.4%  Pump settings:   - Basal rates:   12 am: 0.450, ISF 1:65, ICR 1:10 3 am : 0.700, ISF 1:60, ICR 1:10 7 am: 0.800, ISF 1:50, ICR 1:9 10 am: 0.700, ISF 1:50, ICR 1:9 5 pm: 0.800, ISF 1:40, ICR 1:9 9 pm: 0.800, ISF 1:40, ICR 1:10 - target CBG: 110 - Active insulin time: 5h >> 2.5h  CGM downloads:  Previously:     Total daily dose from basal: 219 units (45%) >> same >> 37% (15 units) Total daily dose from bolus: 23 units (54%) >> same >> 62% (26 units) Uses 41-60 units a day. - highest CBG:  upper 300s >> 356 (site pb) >> 300. - lowest CBG: 40 >> 40 >> 45.  Pt's meals are: - Breakfast:  2 eggs + bagel + coffee, sometimes cereals or occasionally morning PB + jelly - Lunch: sandwich, fruit (apple), veggies (carrots) and, goldfish >> Pb + jelly + apple - Dinner: chicken, rice, and veggies - Snacks: 2 snacks: almonds, fruit, or cheese crackers  He is exercising around 5 pm-disconnect the pump when exercising.  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   -No CKD: Lab Results  Component  Value Date   BUN 8 12/29/2021   CREATININE 1.04 12/29/2021   -Normal ACR: Lab Results  Component Value Date   MICRALBCREAT 1.2 06/18/2021   MICRALBCREAT 2.4 04/08/2020   MICRALBCREAT NOTE 11/23/2018   MICRALBCREAT 0.9 02/26/2016   MICRALBCREAT 2.4 02/23/2015   MICRALBCREAT 0.5 12/24/2013   MICRALBCREAT 0.3 02/11/2013   -No HL; last set of lipids:  Lab Results  Component Value Date   CHOL 177 06/18/2021   HDL 64.10 06/18/2021   LDLCALC 103 (H) 06/18/2021   TRIG 51.0 06/18/2021   CHOLHDL 3 06/18/2021   - last eye exam was on 02/21/2022: + mild PDR OU.  He is getting laser treatments OS.  - no numbness and tingling in his feet.   Hashimoto thyroiditis: -He did not  require levothyroxine  Latest TFTs were normal: Lab Results  Component Value Date   TSH 4.30 11/12/2021   TSH 3.70 06/18/2021   TSH 2.67 04/08/2020   TSH 2.58 01/16/2019   TSH 4.84 (H) 11/23/2018   TSH 3.85 09/25/2017   TSH 1.61 09/27/2016   TSH 2.24 02/26/2016   TSH 2.27 06/25/2015   TSH 4.50 02/23/2015   TSH 1.34 12/24/2013   TSH 2.35 02/11/2013   TSH 1.98 10/10/2012   Lab Results  Component Value Date   FREET4 0.76 11/12/2021   FREET4 0.71 06/18/2021   FREET4 0.74 04/08/2020   FREET4 1.0 01/16/2019   FREET4 0.9 11/23/2018   FREET4 0.9 09/25/2017   FREET4 0.78 09/27/2016   FREET4 0.75 02/26/2016   FREET4 0.86 06/25/2015   FREET4 0.79 02/11/2013   Lab Results  Component Value Date   T3FREE 2.8 06/18/2021   T3FREE 2.4 04/08/2020   T3FREE 2.8 01/16/2019   T3FREE 3.0 11/23/2018   T3FREE 2.8 09/25/2017   T3FREE 3.3 02/26/2016   T3FREE 3.5 06/25/2015   T3FREE 2.7 02/11/2013   His antithyroid antibodies were elevated: Component     Latest Ref Rng & Units 06/25/2015  Thyroperoxidase Ab SerPl-aCnc     <9 IU/mL 75 (H)  Thyroglobulin Ab     <2 IU/mL 4 (H)   He had hip surgery 04/2019.  He has 2 daughters.  ROS: + see HPI  I reviewed pt's medications, allergies, PMH, social hx, family hx, and changes were documented in the history of present illness. Otherwise, unchanged from my initial visit note.  Past Medical History:  Diagnosis Date   Anxiety    Chicken pox    Depression    Diabetes mellitus without complication (HCC)    Diabetic retinopathy ([place])    Eating disorder    Past Surgical History:  Procedure Laterality Date   APPENDECTOMY     LASIK     2013 or 2014   WISDOM TOOTH EXTRACTION     Social History   Socioeconomic History   Marital status: Married    Spouse name: Not on file   Number of children: Not on file   Years of education: Not on file   Highest education level: Not on file  Occupational History   Occupation: Nurse Practitioner     Employer: [employer]  Tobacco Use   Smoking status: Never   Smokeless tobacco: Never  Vaping Use   Vaping Use: Never used  Substance and Sexual Activity   Alcohol use: No   Drug use: No   Sexual activity: Yes  Other Topics Concern   Not on file  Social History Narrative   Married. Daughter 33 years old [place] and  Dian Situ 13 almost 35 year old in 2022. One child passed early -after birth.       FNP endocrinology   App undergrad- graduated [employer]. Masters at [employer].       Regular exercise: yes, biking, run, weight lifting- back to crossfit, mountain biking- slow on this compared to previous with neck      Caffeine use: daily   Social Determinants of Health   Financial Resource Strain: Not on file  Food Insecurity: Not on file  Transportation Needs: Not on file  Physical Activity: Not on file  Stress: Not on file  Social Connections: Not on file  Intimate Partner Violence: Not on file   Current Outpatient Medications on File Prior to Visit  Medication Sig Dispense Refill   Cholecalciferol (VITAMIN D) 50 MCG (2000 UT) CAPS Take by mouth. (Patient not taking: Reported on 02/02/2022)     Glucagon (GVOKE HYPOPEN 1-PACK) 1 MG/0.2ML SOAJ Use as directed for low blood sugar. 0.4 mL PRN   Glucagon, rDNA, (GLUCAGON EMERGENCY) 1 MG KIT Inject 1 mg as directed daily.     influenza vac split quadrivalent PF (FLUARIX QUADRIVALENT) 0.5 ML injection Inject 0.5 mLs into the muscle. 0.5 mL 0   insulin degludec (TRESIBA FLEXTOUCH) 100 UNIT/ML SOPN FlexTouch Pen Inject 0.2-0.26 mLs (20-26 Units total) into the skin daily. 5 pen 5   Insulin Disposable Pump (OMNIPOD 5 G6 POD, GEN 5,) MISC Use every other day. 15 each 11   insulin lispro (HUMALOG) 100 UNIT/ML injection Inject up to 75 units into the pump daily as advised 30 mL 3   Insulin Pen Needle (BD PEN NEEDLE NANO U/F) 32G X 4 MM MISC Use 4x a day 200 each 11   No current facility-administered medications on file prior to visit.   Allergies   Allergen Reactions   Penicillins Rash   Penicillin G Nausea And Vomiting   Family History  Problem Relation Age of Onset   Thyroid disease Mother    Hyperlipidemia Mother    Depression Mother    GER disease Mother    Hypertension Mother    Other Mother        prediabetes   Hyperlipidemia Father    Alcohol abuse Maternal Grandmother    Diabetes Maternal Grandmother    Cancer Maternal Grandfather        colon and prostate. age 58 in 2018   Stroke Maternal Grandfather        in 45s   Dementia Maternal Grandfather        vascular   Healthy Sister    PE: BP 118/72 (BP Location: Left Arm, Patient Position: Sitting, Cuff Size: Normal)   Pulse 68   Ht 5' 9" (1.753 m)   SpO2 97%   BMI 25.67 kg/m  Pt. declined being weighed today Wt Readings from Last 3 Encounters:  12/29/21 173 lb 12.8 oz (78.8 kg)  12/27/21 177 lb (80.3 kg)  04/16/21 173 lb (78.5 kg)   Constitutional: normal weight, fit, in NAD Eyes:  EOMI, no exophthalmos ENT: no neck masses, no cervical lymphadenopathy Cardiovascular: RRR, No MRG Respiratory: CTA B Musculoskeletal: no deformities Skin:no rashes Neurological: + Mild tremor with outstretched hands Diabetic Foot Exam - Simple   Simple Foot Form Diabetic Foot exam was performed with the following findings: Yes 04/15/2022 11:28 AM  Visual Inspection No deformities, no ulcerations, no other skin breakdown bilaterally: Yes Sensation Testing Intact to touch and monofilament testing bilaterally: Yes Pulse Check Posterior Tibialis and Dorsalis  pulse intact bilaterally: Yes Comments    ASSESSMENT: 1. DM1, controlled, with complications:  - Mild NPDR OU  2. Hashimoto thyroiditis - Euthyroid  PLAN:  1. Patient with longstanding, well-controlled, type 2 diabetes, with a history of hypoglycemic episodes, now improved.  He is alternating between OmniPod and t:slim insulin pump.  He is currently on the t:slim insulin pump communicating with the Dexcom G7.   He did try the iLet since last visit but he did not like it as he could not bolus for snacks or control the pump in any way. CGM interpretation: -At today's visit, we reviewed his CGM downloads: It appears that 82% of values are in target range (goal >70%), while 16% are higher than 180 (goal <25%), and 2% are lower than 70 (goal <4%).  The calculated average blood sugar is 135.  The projected HbA1c for the next 3 months (GMI) is 6.5%. -Reviewing the CGM trends, sugars are mostly fluctuating within the target range with only an occasional higher blood sugar -these are happening when he has pump site failure but also, he had some higher blood sugars with the holidays (Thanksgiving) and he also had an upper respiratory infection at that time.  He did adjust some of his blood sugars recently. -For now, I did not suggest any change in regimen. -He has a G-Voke pen at home.  Baqsimi was not covered by his insurance. -I advised him to: Patient Instructions  Please use the following pump settings - Basal rates:   12 am: 0.400, ISF 1:70, ICR 1:14 3 am : 0.700, ISF 1:65, ICR 1:10 7 am: 0.800, ISF 1:45, ICR 1:9 10 am: 0.700, ISF 1:50, ICR 1:8 4:30 pm: 0.870, ISF 1:40, ICR 1:9 9 pm: 0.650, ISF 1:55, ICR 1:12 - target CBG: 110 - Active insulin time: 5 h  Please return in 6 months.  - we checked his HbA1c: 6.1% (slightly higher) - advised to check sugars at different times of the day - 4x a day, rotating check times - advised for yearly eye exams >> he is UTD - return to clinic in 6 months  2. Hashimoto's thyroiditis -No signs or symptoms of hypothyroidism -Most recent TFTs reviewed and the TSH was normal: Lab Results  Component Value Date   TSH 4.30 11/12/2021  -We will continue to keep an eye on his TFTs -he will have these checked by PCP at next visit (less expensive)  Philemon Kingdom, MD PhD [facility] Endocrinology

## 2022-04-15 NOTE — Patient Instructions (Addendum)
Please use the following pump settings - Basal rates:   12 am: 0.400, ISF 1:70, ICR 1:14 3 am : 0.700, ISF 1:65, ICR 1:10 7 am: 0.800, ISF 1:45, ICR 1:9 10 am: 0.700, ISF 1:50, ICR 1:8 4:30 pm: 0.870, ISF 1:40, ICR 1:9 9 pm: 0.650, ISF 1:55, ICR 1:12 - target CBG: 110 - Active insulin time: 5 h  Please return in 6 months.

## 2022-04-20 ENCOUNTER — Encounter: Payer: Self-pay | Admitting: Internal Medicine

## 2022-04-20 ENCOUNTER — Encounter: Payer: Self-pay | Admitting: Family Medicine

## 2022-04-21 ENCOUNTER — Other Ambulatory Visit (HOSPITAL_COMMUNITY): Payer: Self-pay

## 2022-04-21 MED ORDER — ONDANSETRON 4 MG PO TBDP
4.0000 mg | ORAL_TABLET | Freq: Three times a day (TID) | ORAL | 1 refills | Status: DC | PRN
  Filled 2022-04-21 – 2022-04-22 (×2): qty 20, 7d supply, fill #0

## 2022-04-22 ENCOUNTER — Other Ambulatory Visit (HOSPITAL_BASED_OUTPATIENT_CLINIC_OR_DEPARTMENT_OTHER): Payer: Self-pay

## 2022-04-25 ENCOUNTER — Other Ambulatory Visit (HOSPITAL_COMMUNITY): Payer: Self-pay

## 2022-05-04 ENCOUNTER — Encounter (INDEPENDENT_AMBULATORY_CARE_PROVIDER_SITE_OTHER): Payer: No Typology Code available for payment source | Admitting: Ophthalmology

## 2022-05-18 ENCOUNTER — Other Ambulatory Visit: Payer: Self-pay | Admitting: Internal Medicine

## 2022-05-18 ENCOUNTER — Other Ambulatory Visit (HOSPITAL_BASED_OUTPATIENT_CLINIC_OR_DEPARTMENT_OTHER): Payer: Self-pay

## 2022-05-18 MED ORDER — INSULIN LISPRO 100 UNIT/ML IJ SOLN
75.0000 [IU] | Freq: Every day | INTRAMUSCULAR | 3 refills | Status: DC
  Filled 2022-05-18 – 2022-06-01 (×3): qty 30, 40d supply, fill #0
  Filled 2022-07-21: qty 30, 40d supply, fill #1
  Filled 2022-08-19 – 2022-08-22 (×2): qty 30, 40d supply, fill #2
  Filled 2022-11-21: qty 30, 40d supply, fill #3

## 2022-06-01 ENCOUNTER — Other Ambulatory Visit (HOSPITAL_BASED_OUTPATIENT_CLINIC_OR_DEPARTMENT_OTHER): Payer: Self-pay

## 2022-06-07 ENCOUNTER — Other Ambulatory Visit (HOSPITAL_COMMUNITY): Payer: Self-pay

## 2022-07-25 ENCOUNTER — Ambulatory Visit (INDEPENDENT_AMBULATORY_CARE_PROVIDER_SITE_OTHER): Payer: 59

## 2022-07-25 ENCOUNTER — Ambulatory Visit (INDEPENDENT_AMBULATORY_CARE_PROVIDER_SITE_OTHER): Payer: 59 | Admitting: Podiatry

## 2022-07-25 DIAGNOSIS — M722 Plantar fascial fibromatosis: Secondary | ICD-10-CM

## 2022-07-25 MED ORDER — MELOXICAM 15 MG PO TABS: 15.0000 mg | ORAL_TABLET | Freq: Every day | ORAL | 1 refills | Status: DC

## 2022-07-25 NOTE — Progress Notes (Signed)
   Chief Complaint  Patient presents with   Plantar Fasciitis    Bilateral plantar fasciitis, rate of pain 6 out of 10, feels like walking on a rock, X-rays done today, Diabetic    Subjective: 37 y.o. male PMHx T1DM presenting today for evaluation of bilateral fifth toe pain.  Onset about 3 weeks ago.  Patient states that he wore a pair of tight tennis shoes which elicited the pain.  The right foot has mostly resolved/improved however he continues to have pain and tenderness to the left fifth MTP joint.  He has also been dealing with plantar fasciitis to the bilateral feet over the past several months and he says that currently it is very tolerable and minimal.  Patient wears good supportive running shoes from [employer] running store and he is very active.  He has been decreasing his activity over the past few weeks to try and alleviate the pain and reduce the inflammation   Past Medical History:  Diagnosis Date   Anxiety    Chicken pox    Depression    Diabetes mellitus without complication ([place])    Diabetic retinopathy ([place])    Eating disorder    Past Surgical History:  Procedure Laterality Date   APPENDECTOMY     LASIK     2013 or 2014   WISDOM TOOTH EXTRACTION     Allergies  Allergen Reactions   Penicillins Rash   Penicillin G Nausea And Vomiting     Objective: Physical Exam General: The patient is alert and oriented x3 in no acute distress.  Dermatology: Skin is warm, dry and supple bilateral lower extremities. Negative for open lesions or macerations bilateral.   Vascular: Dorsalis Pedis and Posterior Tibial pulses palpable bilateral.  Capillary fill time is immediate to all digits.  Neurological: Grossly intact via light touch  Musculoskeletal: Minimal tenderness to palpation to the plantar aspect of the bilateral heels along the plantar fascia.  Pain with palpation and range of motion noted to the fifth MTP bilateral left greater than the right  Radiographic  exam B/L feet 07/25/2022: Normal osseous mineralization. Joint spaces preserved. No fracture/dislocation/boney destruction. No other soft tissue abnormalities or radiopaque foreign bodies.   Assessment: 1. plantar fasciitis bilateral feet; minimally symptomatic 2.  Inflammatory fifth MTP capsulitis bilateral. LT > RT  Plan of Care:  -Patient evaluated.  X-rays reviewed -Today we discussed conservative treatment options including ice, good supportive shoes which the patient already wears, and anti-inflammatory. -T1DM.  Would like to avoid corticosteroid for the moment both oral and injection -Prescription for meloxicam 15 mg daily -Continue wearing good supportive tennis shoes -We did discuss the possibility of orthotics but for now will defer -Return to clinic as needed  *Nurse practitioner for pediatric oncology  Edrick Kins, DPM [facility]  Dr. Edrick Kins, DPM    [address], [place] [address]                Office [phone]  Fax [phone]

## 2022-07-29 ENCOUNTER — Encounter (INDEPENDENT_AMBULATORY_CARE_PROVIDER_SITE_OTHER): Payer: No Typology Code available for payment source | Admitting: Ophthalmology

## 2022-08-04 NOTE — Progress Notes (Signed)
[facility] Note  08/05/2022     CHIEF COMPLAINT Patient presents for Retina Follow Up   HISTORY OF PRESENT ILLNESS: Mark Barr is a 36 y.o. male who presents to the clinic today for:   HPI     Retina Follow Up   Patient presents with  Diabetic Retinopathy.  This started 5 months ago.  Duration of 5 months.  Since onset it is stable.  I, the attending physician,  performed the HPI with the patient and updated documentation appropriately.        Comments   5 month retina follow up NPDR pt is reporting no vision changes noticed his last A1C 5.8 1 month ago he denies any flashes or floaters       Last edited by Bernarda Caffey, MD on 08/05/2022 10:10 PM.     Pt is not having any problems with his vision, pt states his left eye has some blurriness to it  Referring physician: Marin Olp, MD [place],  [place] [address]  HISTORICAL INFORMATION:   Selected notes from the MEDICAL RECORD NUMBER Referred by Dr. Ellie Lunch for concern of CRVO OS LEE:  Ocular Hx- PMH-    CURRENT MEDICATIONS: No current outpatient medications on file. (Ophthalmic Drugs)   No current facility-administered medications for this visit. (Ophthalmic Drugs)   Current Outpatient Medications (Other)  Medication Sig   Glucagon (GVOKE HYPOPEN 1-PACK) 1 MG/0.2ML SOAJ Use as directed for low blood sugar.   influenza vac split quadrivalent PF (FLUARIX QUADRIVALENT) 0.5 ML injection Inject 0.5 mLs into the muscle.   insulin degludec (TRESIBA FLEXTOUCH) 100 UNIT/ML SOPN FlexTouch Pen Inject 0.2-0.26 mLs (20-26 Units total) into the skin daily.   Insulin Disposable Pump (OMNIPOD 5 G6 POD, GEN 5,) MISC Use every other day.   insulin lispro (HUMALOG) 100 UNIT/ML injection Inject up to 75 units into the pump daily as advised   Insulin Pen Needle (BD PEN NEEDLE NANO U/F) 32G X 4 MM MISC Use 4x a day   meloxicam (MOBIC) 15 MG tablet Take 1 tablet (15 mg total)  by mouth daily.   ondansetron (ZOFRAN-ODT) 4 MG disintegrating tablet Take 1 tablet (4 mg total) by mouth every 8 (eight) hours as needed for nausea or vomiting.   No current facility-administered medications for this visit. (Other)   REVIEW OF SYSTEMS: ROS   Positive for: Endocrine, Eyes Negative for: Constitutional, Gastrointestinal, Neurological, Skin, Genitourinary, Musculoskeletal, HENT, Cardiovascular, Respiratory, Psychiatric, Allergic/Imm, Heme/Lymph Last edited by Parthenia Ames, COT on 08/05/2022  1:00 PM.      ALLERGIES Allergies  Allergen Reactions   Penicillins Rash   Penicillin G Nausea And Vomiting   PAST MEDICAL HISTORY Past Medical History:  Diagnosis Date   Anxiety    Chicken pox    Depression    Diabetes mellitus without complication ([place])    Diabetic retinopathy ([place])    Eating disorder    Past Surgical History:  Procedure Laterality Date   APPENDECTOMY     LASIK     2013 or 2014   WISDOM TOOTH EXTRACTION     FAMILY HISTORY Family History  Problem Relation Age of Onset   Thyroid disease Mother    Hyperlipidemia Mother    Depression Mother    GER disease Mother    Hypertension Mother    Other Mother        prediabetes   Hyperlipidemia Father    Alcohol abuse Maternal  Grandmother    Diabetes Maternal Grandmother    Cancer Maternal Grandfather        colon and prostate. age 5 in 2018   Stroke Maternal Grandfather        in 18s   Dementia Maternal Grandfather        vascular   Healthy Sister    SOCIAL HISTORY Social History   Tobacco Use   Smoking status: Never   Smokeless tobacco: Never  Vaping Use   Vaping Use: Never used  Substance Use Topics   Alcohol use: No   Drug use: No       OPHTHALMIC EXAM:  Base Eye Exam     Visual Acuity (Snellen - Linear)       Right Left   Dist [place] 20/20 -1 20/25 -2         Tonometry (Tonopen, 1:05 PM)       Right Left   Pressure 11 14         Pupils       Pupils Dark  Light Shape React APD   Right PERRL 4 3 Round Brisk None   Left PERRL 4 3 Round Brisk None         Visual Fields       Left Right    Full Full         Extraocular Movement       Right Left    Full, Ortho Full, Ortho         Neuro/Psych     Oriented x3: Yes   Mood/Affect: Normal         Dilation     Both eyes: 2.5% Phenylephrine @ 1:04 PM           Slit Lamp and Fundus Exam     Slit Lamp Exam       Right Left   Lids/Lashes Normal Normal   Conjunctiva/Sclera White and quiet White and quiet   Cornea well healed barely visible lasik flap well healed barely visible lasik flap   Anterior Chamber deep and clear deep and clear   Iris Round and dilated, No NVI Round and dilated, No NVI   Lens Clear Clear   Anterior Vitreous mild syneresis mild syneresis         Fundus Exam       Right Left   Disc Pink and Sharp, +PPP, no NVD Pink and Sharp, +PPP, no NVD   C/D Ratio 0.3 0.2   Macula Flat, Good foveal reflex, rare punctate MA Flat, Good foveal reflex, rare punctate MA   Vessels mild attenuation, mild tortuosity, mild copper wiring, no NV mild tortuosity, mild copper wiring, no NV; +IRMA peripheral ST arcades   Periphery Attached, scattered MA / DBH -- rare, greatest temporal quad Attached, scattered MA / DBH -- small focal cluster ST quad, +IRMA, good segmental PRP temporal periphery, room for posterior fill in           IMAGING AND PROCEDURES  Imaging and Procedures for 08/05/2022  OCT, Retina - OU - Both Eyes       Right Eye Quality was good. Central Foveal Thickness: 281. Progression has been stable. Findings include normal foveal contour, no IRF, no SRF.   Left Eye Quality was good. Central Foveal Thickness: 291. Progression has been stable. Findings include normal foveal contour, no IRF, no SRF.   Notes *Images captured and stored on drive  Diagnosis / Impression:  NFP, no IRF/SRF OU No DME  OU  Clinical management:  See  below  Abbreviations: NFP - Normal foveal profile. CME - cystoid macular edema. PED - pigment epithelial detachment. IRF - intraretinal fluid. SRF - subretinal fluid. EZ - ellipsoid zone. ERM - epiretinal membrane. ORA - outer retinal atrophy. ORT - outer retinal tubulation. SRHM - subretinal hyper-reflective material. IRHM - intraretinal hyper-reflective material      Fluorescein Angiography Optos (Transit OS)       Right Eye Progression has been stable. Early phase findings include microaneurysm. Mid/Late phase findings include microaneurysm (No significant leakage; no NV).   Left Eye Progression has been stable. Early phase findings include staining, microaneurysm, vascular perfusion defect. Mid/Late phase findings include leakage, staining, microaneurysm, vascular perfusion defect (+IRMA -- peripheral ST arcades; no frank NVE; vascular perfusion defects and +perivascular leakage temporal periphery; late leaking MA, staining of segmental PRP temporal periphery).   Notes **Images stored on drive**  Impression: Mild-Moderate NPDR OU OD: scattered MA; no significant leakage; no NV OS: +IRMA -- peripheral ST arcades persistent; no frank NVE; vascular perfusion defects and +perivascular leakage temporal periphery; late leaking MA, staining of segmental PRP temporal periphery -- room for fill-in of vascular nonperfusion             ASSESSMENT/PLAN:    ICD-10-CM   1. 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 (HCC)  E10.3293 OCT, Retina - OU - Both Eyes    Fluorescein Angiography Optos (Transit OS)      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, both eyes - BCVA 20/20 OD, 20/25 OS - OCT without diabetic macular edema, both eyes  - exam shows scattered MA OU (OS > OD); OS w/ focal cluster of DBH temporal periphery and +IRMA - FA (09.20.23) shows MA OU; OS with peripheral nonperfusion and  corresponding leakage - s/p segmental PRP laser OS (10.09.23) - temporal periphery - repeat FA 03.22.24 shows +MA, no NV OU; OS +IRMA -- peripheral ST arcades persistent; no frank NVE; vascular perfusion defects and +perivascular leakage temporal periphery; late leaking MA, staining of segmental PRP temporal periphery -- room for fill-in of vascular nonperfusion  - discussed findings, prognosis and treatment options - recommend fill in segmental PRP to areas of vascular nonperfusion and tighter to IRMA to induce regression -- pt in agreement - will schedule PRP fill in laser within 6 weeks -- pt prefers Friday appt   Ophthalmic Meds Ordered this visit:  No orders of the defined types were placed in this encounter.    Return for f/u within 6 weeks, Friday, 10am appt, NPDR OU.  There are no Patient Instructions on file for this visit.   This document serves as a record of services personally performed by Gardiner Sleeper, MD, PhD. It was created on their behalf by Joetta Manners COT, an ophthalmic technician. The creation of this record is the provider's dictation and/or activities during the visit.    Electronically signed by: Joetta Manners COT 03.21.2024 10:11 PM  This document serves as a record of services personally performed by Gardiner Sleeper, MD, PhD. It was created on their behalf by San Jetty. Owens Shark, OA an ophthalmic technician. The creation of this record is the provider's dictation and/or activities during the visit.    Electronically signed by: San Jetty. Owens Shark, [place] 03.22.2024 10:11 PM  Gardiner Sleeper, M.D., Ph.D. Diseases & Surgery of the Retina and [place] 08/05/2022   I  have reviewed the above documentation for accuracy and completeness, and I agree with the above. Gardiner Sleeper, M.D., Ph.D. 08/05/22 10:17 PM   Abbreviations: M myopia (nearsighted); A astigmatism; H hyperopia (farsighted); P presbyopia; Mrx spectacle  prescription;  CTL contact lenses; OD right eye; OS left eye; OU both eyes  XT exotropia; ET esotropia; PEK punctate epithelial keratitis; PEE punctate epithelial erosions; DES dry eye syndrome; MGD meibomian gland dysfunction; ATs artificial tears; PFAT's preservative free artificial tears; [place] nuclear sclerotic cataract; PSC posterior subcapsular cataract; ERM epi-retinal membrane; PVD posterior vitreous detachment; RD retinal detachment; DM diabetes mellitus; DR diabetic retinopathy; NPDR non-proliferative diabetic retinopathy; PDR proliferative diabetic retinopathy; CSME clinically significant macular edema; DME diabetic macular edema; dbh dot blot hemorrhages; CWS cotton wool spot; POAG primary open angle glaucoma; C/D cup-to-disc ratio; HVF humphrey visual field; GVF goldmann visual field; OCT optical coherence tomography; IOP intraocular pressure; BRVO Branch retinal vein occlusion; CRVO central retinal vein occlusion; CRAO central retinal artery occlusion; BRAO branch retinal artery occlusion; RT retinal tear; SB scleral buckle; PPV pars plana vitrectomy; VH Vitreous hemorrhage; PRP panretinal laser photocoagulation; IVK intravitreal kenalog; VMT vitreomacular traction; MH Macular hole;  NVD neovascularization of the disc; NVE neovascularization elsewhere; AREDS age related eye disease study; ARMD age related macular degeneration; POAG primary open angle glaucoma; EBMD epithelial/anterior basement membrane dystrophy; ACIOL anterior chamber intraocular lens; IOL intraocular lens; PCIOL posterior chamber intraocular lens; Phaco/IOL phacoemulsification with intraocular lens placement; [place] photorefractive keratectomy; LASIK laser assisted in situ keratomileusis; HTN hypertension; DM diabetes mellitus; COPD chronic obstructive pulmonary disease

## 2022-08-05 ENCOUNTER — Encounter (INDEPENDENT_AMBULATORY_CARE_PROVIDER_SITE_OTHER): Payer: Self-pay | Admitting: Ophthalmology

## 2022-08-05 ENCOUNTER — Ambulatory Visit (INDEPENDENT_AMBULATORY_CARE_PROVIDER_SITE_OTHER): Payer: 59 | Admitting: Ophthalmology

## 2022-08-05 DIAGNOSIS — E103293 Type 1 diabetes mellitus with mild nonproliferative diabetic retinopathy without macular edema, bilateral: Secondary | ICD-10-CM

## 2022-08-06 ENCOUNTER — Encounter (INDEPENDENT_AMBULATORY_CARE_PROVIDER_SITE_OTHER): Payer: Self-pay | Admitting: Ophthalmology

## 2022-08-17 ENCOUNTER — Encounter (INDEPENDENT_AMBULATORY_CARE_PROVIDER_SITE_OTHER): Payer: 59 | Admitting: Ophthalmology

## 2022-08-17 DIAGNOSIS — E103293 Type 1 diabetes mellitus with mild nonproliferative diabetic retinopathy without macular edema, bilateral: Secondary | ICD-10-CM

## 2022-08-19 ENCOUNTER — Other Ambulatory Visit (HOSPITAL_BASED_OUTPATIENT_CLINIC_OR_DEPARTMENT_OTHER): Payer: Self-pay

## 2022-08-22 ENCOUNTER — Other Ambulatory Visit (HOSPITAL_BASED_OUTPATIENT_CLINIC_OR_DEPARTMENT_OTHER): Payer: Self-pay

## 2022-08-24 NOTE — Progress Notes (Signed)
[facility] - Clinic Note  08/31/2022     CHIEF COMPLAINT Patient presents for Retina Follow Up   HISTORY OF PRESENT ILLNESS: Mark Barr is a 37 y.o. male who presents to the clinic today for:   HPI     Retina Follow Up   Patient presents with  Diabetic Retinopathy.  In right eye.  This started 4 weeks ago.  Duration of 4 weeks.  Since onset it is stable.  I, the attending physician,  performed the HPI with the patient and updated documentation appropriately.        Comments   4 week retina follow up PRP fill in OD pt is reporting stable vision he denies any flashes or floaters last blood sugar reading 107 now       Last edited by Rennis ChrisZamora, Waleska Buttery, MD on 08/31/2022  4:22 PM.    Pt here for PRP OS fill in today   Referring physician: Shelva MajesticHunter, Stephen O, MD [address] [place] [id number]  HISTORICAL INFORMATION:   Selected notes from the MEDICAL RECORD NUMBER Referred by Dr. Charlotte SanesMcCuen for concern of CRVO OS LEE:  Ocular Hx- PMH-    CURRENT MEDICATIONS: Current Outpatient Medications (Ophthalmic Drugs)  Medication Sig   prednisoLONE acetate (PRED FORTE) 1 % ophthalmic suspension Place 1 drop into the left eye 4 (four) times daily for 7 days.   No current facility-administered medications for this visit. (Ophthalmic Drugs)   Current Outpatient Medications (Other)  Medication Sig   Glucagon (GVOKE HYPOPEN 1-PACK) 1 MG/0.2ML SOAJ Use as directed for low blood sugar.   influenza vac split quadrivalent PF (FLUARIX QUADRIVALENT) 0.5 ML injection Inject 0.5 mLs into the muscle.   insulin degludec (TRESIBA FLEXTOUCH) 100 UNIT/ML SOPN FlexTouch Pen Inject 0.2-0.26 mLs (20-26 Units total) into the skin daily.   Insulin Disposable Pump (OMNIPOD 5 G6 POD, GEN 5,) MISC Use every other day.   insulin lispro (HUMALOG) 100 UNIT/ML injection Inject up to 75 units into the pump daily as advised   Insulin Pen Needle (BD PEN NEEDLE NANO U/F) 32G X 4  MM MISC Use 4x a day   meloxicam (MOBIC) 15 MG tablet Take 1 tablet (15 mg total) by mouth daily.   ondansetron (ZOFRAN-ODT) 4 MG disintegrating tablet Take 1 tablet (4 mg total) by mouth every 8 (eight) hours as needed for nausea or vomiting.   No current facility-administered medications for this visit. (Other)   REVIEW OF SYSTEMS: ROS   Positive for: Endocrine, Eyes Negative for: Constitutional, Gastrointestinal, Neurological, Skin, Genitourinary, Musculoskeletal, HENT, Cardiovascular, Respiratory, Psychiatric, Allergic/Imm, Heme/Lymph Last edited by Etheleen MayhewMendenhall, Jennifer W, COT on 08/31/2022  2:47 PM.     ALLERGIES Allergies  Allergen Reactions   Penicillins Rash   Penicillin G Nausea And Vomiting   PAST MEDICAL HISTORY Past Medical History:  Diagnosis Date   Anxiety    Chicken pox    Depression    Diabetes mellitus without complication    Diabetic retinopathy    Eating disorder    Past Surgical History:  Procedure Laterality Date   APPENDECTOMY     LASIK     2013 or 2014   WISDOM TOOTH EXTRACTION     FAMILY HISTORY Family History  Problem Relation Age of Onset   Thyroid disease Mother    Hyperlipidemia Mother    Depression Mother    GER disease Mother    Hypertension Mother    Other Mother  prediabetes   Hyperlipidemia Father    Alcohol abuse Maternal Grandmother    Diabetes Maternal Grandmother    Cancer Maternal Grandfather        colon and prostate. age 100 in 2018   Stroke Maternal Grandfather        in 48s   Dementia Maternal Grandfather        vascular   Healthy Sister    SOCIAL HISTORY Social History   Tobacco Use   Smoking status: Never   Smokeless tobacco: Never  Vaping Use   Vaping Use: Never used  Substance Use Topics   Alcohol use: No   Drug use: No       OPHTHALMIC EXAM:  Base Eye Exam     Visual Acuity (Snellen - Linear)       Right Left   Dist [place] 20/20 20/20 -2         Tonometry (Tonopen, 2:50 PM)       Right  Left   Pressure 15 13         Pupils       Pupils Dark Light Shape React APD   Right PERRL 4 3 Round Brisk None   Left PERRL 4 3 Round Brisk None         Visual Fields       Left Right    Full Full         Extraocular Movement       Right Left    Full, Ortho Full, Ortho         Neuro/Psych     Oriented x3: Yes   Mood/Affect: Normal         Dilation     Both eyes: 2.5% Phenylephrine @ 2:49 PM           Slit Lamp and Fundus Exam     Slit Lamp Exam       Right Left   Lids/Lashes Normal Normal   Conjunctiva/Sclera White and quiet White and quiet   Cornea well healed barely visible lasik flap well healed barely visible lasik flap   Anterior Chamber deep and clear deep and clear   Iris Round and dilated, No NVI Round and dilated, No NVI   Lens Clear Clear   Anterior Vitreous mild syneresis mild syneresis         Fundus Exam       Right Left   Disc Pink and Sharp, +PPP, no NVD Pink and Sharp, +PPP, no NVD   C/D Ratio 0.3 0.2   Macula Flat, Good foveal reflex, rare punctate MA Flat, Good foveal reflex, rare punctate MA   Vessels mild attenuation, mild tortuosity, mild copper wiring, no NV mild tortuosity, mild copper wiring, no NV; +IRMA peripheral ST arcades   Periphery Attached, scattered MA / DBH -- rare, greatest temporal quad Attached, scattered MA / DBH -- small focal cluster ST quad, +IRMA, good segmental PRP temporal periphery, room for posterior fill in           IMAGING AND PROCEDURES  Imaging and Procedures for 08/31/2022  OCT, Retina - OU - Both Eyes       Right Eye Quality was good. Central Foveal Thickness: 283. Progression has been stable. Findings include normal foveal contour, no IRF, no SRF, vitreomacular adhesion .   Left Eye Quality was good. Central Foveal Thickness: 292. Progression has been stable. Findings include normal foveal contour, no IRF, no SRF, vitreomacular adhesion .   Notes *Images  captured and stored  on drive  Diagnosis / Impression:  NFP, no IRF/SRF OU No DME OU  Clinical management:  See below  Abbreviations: NFP - Normal foveal profile. CME - cystoid macular edema. PED - pigment epithelial detachment. IRF - intraretinal fluid. SRF - subretinal fluid. EZ - ellipsoid zone. ERM - epiretinal membrane. ORA - outer retinal atrophy. ORT - outer retinal tubulation. SRHM - subretinal hyper-reflective material. IRHM - intraretinal hyper-reflective material      Panretinal Photocoagulation - OS - Left Eye       LASER PROCEDURE NOTE  Diagnosis:   Nonproliferative Diabetic Retinopathy w/ peripheral vascular nonperfusion, LEFT EYE  Procedure:  Segmental pan-retinal photocoagulation using slit lamp laser, LEFT EYE, fill in  Anesthesia:  Topical  Surgeon: Rennis Chris, MD, PhD   Informed consent obtained, operative eye marked, and time out performed prior to initiation of laser.   [place] [id number] slit lamp laser Pattern: 2x2 and 3x3 square Power: 240 mW Duration: 30 msec  Spot size: 200 microns  # spots: 590 spots temporal periphery  Complications: None.  RTC: 2-3 mos for DFE/OCT, possible repeat FA  Patient tolerated the procedure well and received written and verbal post-procedure care information/education.            ASSESSMENT/PLAN:    ICD-10-CM   1. 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  E10.3293 OCT, Retina - OU - Both Eyes    Panretinal Photocoagulation - OS - Left Eye     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, both eyes - BCVA 20/20 OU - OCT without diabetic macular edema, both eyes  - exam shows scattered MA OU (OS > OD); OS w/ focal cluster of DBH temporal periphery and +IRMA - FA (09.20.23) shows MA OU; OS with peripheral nonperfusion and corresponding leakage - s/p segmental PRP laser OS (10.09.23) - temporal periphery - repeat FA 03.22.24 shows +MA, no NV  OU; OS +IRMA -- peripheral ST arcades; no frank NVE; vascular perfusion defects and +perivascular leakage temporal periphery; late leaking MA, staining of segmental PRP temporal periphery -- room for fill-in of vascular nonperfusion  - discussed findings, prognosis and treatment options - recommend fill in segmental PRP OS to areas of vascular nonperfusion and tighter to IRMA to induce regression today, 04.17.24 - pt wishes to proceed with laser OS - RBA of procedure discussed, questions answered - informed consent obtained and signed - see procedure note - f/u 2-3 months, DFE, OCT, possible FA OS   Ophthalmic Meds Ordered this visit:  Meds ordered this encounter  Medications   prednisoLONE acetate (PRED FORTE) 1 % ophthalmic suspension    Sig: Place 1 drop into the left eye 4 (four) times daily for 7 days.    Dispense:  10 mL    Refill:  0     Return for f/u 2-3 months, NPDR OU, DFE, OCT.  There are no Patient Instructions on file for this visit.   This document serves as a record of services personally performed by Karie Chimera, MD, PhD. It was created on their behalf by De Blanch, an ophthalmic technician. The creation of this record is the provider's dictation and/or activities during the visit.    Electronically signed by: De Blanch, OA, 08/31/22  4:25 PM  This document serves as a record of services personally performed by Karie Chimera, MD, PhD. It was created on their behalf by Glee Arvin. Manson Passey, OA an  ophthalmic technician. The creation of this record is the provider's dictation and/or activities during the visit.    Electronically signed by: Glee Arvin. Manson Passey, [place] 04.17.2024 4:25 PM  Karie Chimera, M.D., Ph.D. Diseases & Surgery of the Retina and Vitreous [facility] 08/31/2022   I have reviewed the above documentation for accuracy and completeness, and I agree with the above. Karie Chimera, M.D., Ph.D. 08/31/22 4:27  PM   Abbreviations: M myopia (nearsighted); A astigmatism; H hyperopia (farsighted); P presbyopia; Mrx spectacle prescription;  CTL contact lenses; OD right eye; OS left eye; OU both eyes  XT exotropia; ET esotropia; PEK punctate epithelial keratitis; PEE punctate epithelial erosions; DES dry eye syndrome; MGD meibomian gland dysfunction; ATs artificial tears; PFAT's preservative free artificial tears; [place] nuclear sclerotic cataract; PSC posterior subcapsular cataract; ERM epi-retinal membrane; PVD posterior vitreous detachment; RD retinal detachment; DM diabetes mellitus; DR diabetic retinopathy; NPDR non-proliferative diabetic retinopathy; PDR proliferative diabetic retinopathy; CSME clinically significant macular edema; DME diabetic macular edema; dbh dot blot hemorrhages; CWS cotton wool spot; POAG primary open angle glaucoma; C/D cup-to-disc ratio; HVF humphrey visual field; GVF goldmann visual field; OCT optical coherence tomography; IOP intraocular pressure; BRVO Branch retinal vein occlusion; CRVO central retinal vein occlusion; CRAO central retinal artery occlusion; BRAO branch retinal artery occlusion; RT retinal tear; SB scleral buckle; PPV pars plana vitrectomy; VH Vitreous hemorrhage; PRP panretinal laser photocoagulation; IVK intravitreal kenalog; VMT vitreomacular traction; MH Macular hole;  NVD neovascularization of the disc; NVE neovascularization elsewhere; AREDS age related eye disease study; ARMD age related macular degeneration; POAG primary open angle glaucoma; EBMD epithelial/anterior basement membrane dystrophy; ACIOL anterior chamber intraocular lens; IOL intraocular lens; PCIOL posterior chamber intraocular lens; Phaco/IOL phacoemulsification with intraocular lens placement; PRK photorefractive keratectomy; LASIK laser assisted in situ keratomileusis; HTN hypertension; DM diabetes mellitus; COPD chronic obstructive pulmonary disease

## 2022-08-31 ENCOUNTER — Ambulatory Visit (INDEPENDENT_AMBULATORY_CARE_PROVIDER_SITE_OTHER): Payer: 59 | Admitting: Ophthalmology

## 2022-08-31 ENCOUNTER — Encounter (INDEPENDENT_AMBULATORY_CARE_PROVIDER_SITE_OTHER): Payer: Self-pay | Admitting: Ophthalmology

## 2022-08-31 DIAGNOSIS — E103293 Type 1 diabetes mellitus with mild nonproliferative diabetic retinopathy without macular edema, bilateral: Secondary | ICD-10-CM | POA: Diagnosis not present

## 2022-08-31 MED ORDER — PREDNISOLONE ACETATE 1 % OP SUSP: 1.0000 [drp] | Freq: Four times a day (QID) | OPHTHALMIC | 0 refills | Status: AC

## 2022-09-02 DIAGNOSIS — F411 Generalized anxiety disorder: Secondary | ICD-10-CM | POA: Diagnosis not present

## 2022-09-02 DIAGNOSIS — F5001 Anorexia nervosa, restricting type: Secondary | ICD-10-CM | POA: Diagnosis not present

## 2022-09-05 ENCOUNTER — Encounter: Payer: Self-pay | Admitting: Internal Medicine

## 2022-09-06 ENCOUNTER — Other Ambulatory Visit: Payer: Self-pay | Admitting: Podiatry

## 2022-09-06 ENCOUNTER — Other Ambulatory Visit (HOSPITAL_COMMUNITY): Payer: Self-pay

## 2022-09-06 MED ORDER — MELOXICAM 15 MG PO TABS
15.0000 mg | ORAL_TABLET | Freq: Every day | ORAL | 1 refills | Status: AC
  Filled 2022-09-06 – 2022-09-09 (×2): qty 60, 60d supply, fill #0
  Filled 2023-07-10 (×2): qty 60, 60d supply, fill #1

## 2022-09-09 ENCOUNTER — Other Ambulatory Visit (HOSPITAL_COMMUNITY): Payer: Self-pay

## 2022-09-09 ENCOUNTER — Other Ambulatory Visit: Payer: Self-pay

## 2022-09-12 ENCOUNTER — Other Ambulatory Visit: Payer: Self-pay

## 2022-09-23 DIAGNOSIS — F5001 Anorexia nervosa, restricting type: Secondary | ICD-10-CM | POA: Diagnosis not present

## 2022-09-23 DIAGNOSIS — F411 Generalized anxiety disorder: Secondary | ICD-10-CM | POA: Diagnosis not present

## 2022-11-01 ENCOUNTER — Encounter: Payer: Self-pay | Admitting: Internal Medicine

## 2022-11-04 ENCOUNTER — Ambulatory Visit: Payer: 59 | Admitting: Internal Medicine

## 2022-11-04 ENCOUNTER — Encounter: Payer: Self-pay | Admitting: Internal Medicine

## 2022-11-04 VITALS — BP 120/70 | HR 69 | Ht 69.0 in | Wt 168.0 lb

## 2022-11-04 DIAGNOSIS — E109 Type 1 diabetes mellitus without complications: Secondary | ICD-10-CM

## 2022-11-04 DIAGNOSIS — E063 Autoimmune thyroiditis: Secondary | ICD-10-CM

## 2022-11-04 LAB — POCT GLYCOSYLATED HEMOGLOBIN (HGB A1C): Hemoglobin A1C: 6.1 % — AB (ref 4.0–5.6)

## 2022-11-04 LAB — T4, FREE: Free T4: 0.91 ng/dL (ref 0.60–1.60)

## 2022-11-04 LAB — TSH: TSH: 2.38 u[IU]/mL (ref 0.35–5.50)

## 2022-11-04 LAB — T3, FREE: T3, Free: 2.4 pg/mL (ref 2.3–4.2)

## 2022-11-04 NOTE — Patient Instructions (Addendum)
Please use the following pump settings - Basal rates:   12 am: 0.350, ISF 1:85, ICR 1:20 3 am : 0.700, ISF 1:75, ICR 1:14 6 am: 0.800, ISF 1:45, ICR 1:9 10 am: 0.750, ISF 1:45, ICR 1:8 4 pm: 0.800, ISF 1:45, ICR 1:9 9 pm: 0.650, ISF 1:60, ICR 1:12 - target CBG: 110 - Active insulin time: 5 h  Please stop at the lab.  Please return in 6 months.

## 2022-11-04 NOTE — Progress Notes (Signed)
Patient ID: MAXIMILLION GILL, male   DOB: 05-09-1986, 37 y.o.   MRN: [id number]  HPI: TANVIR HIPPLE is a 37 y.o.-year-old male, returning for f/u for DM1, dx 7 (at 37 y/o), controlled, w/o complications, on insulin pump since 2000. Last visit 6 months ago.  Interim history: No increased urination, blurry vision, nausea, chest pain.  He continues to exercise consistently, mostly in the morning, but also more recently, close to dinnertime. He does mention fatigue and longstanding cold intolerance.  Insulin pump: - Medtronic Revel 530 G loaner - Omnipod since12/09/2013 >> loved it, however he developed a blistering a rash and the attaching site (started to use Flonase before attaching the pump, which helped) - Medtronic 723  - Medtronic 670G since 10/2015 - T:slim X2 since 03/2017.  He is using the control IQ technology. - Omnipod 5 alternating with tandem t:slim pump  -currently on the tandem pump - tried the iLet pump - did not like it  - now on Tandem Mobi loaner, but would like to switch to this pump definitively  CGM: -Dexcom G6 since 01/2017 -supplies from [employer]  Insulin: -He initially tried NovoLog and Fiasp in the pump, but did not see a difference between the 2 in the 670 G pump.   -After he switched to the T slim pump, he felt Candie Mile was working better for him -However, he is now using Humalog per insurance preference -We tried Lyumjev 04/2020 but this caused burning at the infusion site so he stopped  Reviewed HbA1c levels: Lab Results  Component Value Date   HGBA1C 6.0 (A) 10/15/2021   HGBA1C 5.7 (A) 10/16/2020   HGBA1C 5.7 08/05/2019   HGBA1C 6.4 (A) 11/23/2018   HGBA1C 6.3 04/16/2018   HGBA1C 6.6 (H) 02/26/2016   HGBA1C 6.5 10/01/2015   HGBA1C 7.1 (H) 06/25/2015   HGBA1C 6.8 02/19/2015   HGBA1C 6.5 08/25/2014   HGBA1C 7.2 (H) 04/28/2014   HGBA1C 6.9 (H) 12/24/2013   HGBA1C 6.1 09/16/2013   HGBA1C 6.6 (H) 05/13/2013   HGBA1C 6.9 (H) 02/11/2013   04/16/2021: HbA1c 6.3% 04/10/2018: HbA1c 6.3% 09/15/2017: HbA1c 6.4%  Pump settings:   - Basal rates:   12 am: 0.400, ISF 1:70, ICR 1:14 3 am : 0.700, ISF 1:65, ICR 1:10 7 am: 0.800, ISF 1:45, ICR 1:9 10 am: 0.700, ISF 1:50, ICR 1:8 4:30 pm: 0.870, ISF 1:40, ICR 1:9 9 pm: 0.650, ISF 1:55, ICR 1:12 - target CBG: 110 - Active insulin time: 5 h  CGM downloads:  Previously:  Previously:   Total daily dose from basal: 219 units (45%) >> same >> 37% (15 units) Total daily dose from bolus: 23 units (54%) >> same >> 62% (26 units) Uses 41-60 units a day. - highest CBG:  upper 300s >> 356 (site pb) >> 300 >> 250s - pump site failure. - lowest CBG: 40 >> 40 >> 45 >> 40s (increased activity).  Pt's meals are: - Breakfast:  2 eggs + bagel + coffee, sometimes cereals or occasionally morning PB + jelly - Lunch: sandwich, fruit (apple), veggies (carrots) and, goldfish >> Pb + jelly + apple - Dinner: chicken, rice, and veggies - Snacks: 2 snacks: almonds, fruit, or cheese crackers  He is exercising around 5 pm-disconnect the pump when exercising.  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   -No CKD: 09/05/2022: need records Lab Results  Component Value Date   BUN 8 12/29/2021   CREATININE 1.04 12/29/2021   -Normal ACR:  Lab Results  Component Value Date   MICRALBCREAT 1.2 06/18/2021   MICRALBCREAT 2.4 04/08/2020   MICRALBCREAT NOTE 11/23/2018   MICRALBCREAT 0.9 02/26/2016   MICRALBCREAT 2.4 02/23/2015   MICRALBCREAT 0.5 12/24/2013   MICRALBCREAT 0.3 02/11/2013   -No HL; last set of lipids:  09/05/2022: 159/60/65/82 Lab Results  Component Value Date   CHOL 177 06/18/2021   HDL 64.10 06/18/2021   LDLCALC 103 (H) 06/18/2021   TRIG 51.0 06/18/2021   CHOLHDL 3 06/18/2021   - last eye exam was on 08/31/2022: + mild PDR OU.  He is getting laser treatments OS.  - no numbness and tingling in his feet.  Foot exam 04/15/2022 here in clinic and on 07/25/2022 by  Dr. Logan Bores.  Hashimoto thyroiditis: -He did not require levothyroxine  Reviewed TFTs: 09/05/2022: TSH 5.45 (<4.5) Lab Results  Component Value Date   TSH 4.30 11/12/2021   TSH 3.70 06/18/2021   TSH 2.67 04/08/2020   TSH 2.58 01/16/2019   TSH 4.84 (H) 11/23/2018   TSH 3.85 09/25/2017   TSH 1.61 09/27/2016   TSH 2.24 02/26/2016   TSH 2.27 06/25/2015   TSH 4.50 02/23/2015   TSH 1.34 12/24/2013   TSH 2.35 02/11/2013   TSH 1.98 10/10/2012   Lab Results  Component Value Date   FREET4 0.76 11/12/2021   FREET4 0.71 06/18/2021   FREET4 0.74 04/08/2020   FREET4 1.0 01/16/2019   FREET4 0.9 11/23/2018   FREET4 0.9 09/25/2017   FREET4 0.78 09/27/2016   FREET4 0.75 02/26/2016   FREET4 0.86 06/25/2015   FREET4 0.79 02/11/2013   Lab Results  Component Value Date   T3FREE 2.8 06/18/2021   T3FREE 2.4 04/08/2020   T3FREE 2.8 01/16/2019   T3FREE 3.0 11/23/2018   T3FREE 2.8 09/25/2017   T3FREE 3.3 02/26/2016   T3FREE 3.5 06/25/2015   T3FREE 2.7 02/11/2013   His antithyroid antibodies were elevated: Component     Latest Ref Rng & Units 06/25/2015  Thyroperoxidase Ab SerPl-aCnc     <9 IU/mL 75 (H)  Thyroglobulin Ab     <2 IU/mL 4 (H)   He had hip surgery 04/2019.  He has 2 daughters.  ROS: + see HPI  I reviewed pt's medications, allergies, PMH, social hx, family hx, and changes were documented in the history of present illness. Otherwise, unchanged from my initial visit note.  Past Medical History:  Diagnosis Date   Anxiety    Chicken pox    Depression    Diabetes mellitus without complication (HCC)    Diabetic retinopathy (HCC)    Eating disorder    Past Surgical History:  Procedure Laterality Date   APPENDECTOMY     LASIK     2013 or 2014   WISDOM TOOTH EXTRACTION     Social History   Socioeconomic History   Marital status: Married    Spouse name: Not on file   Number of children: Not on file   Years of education: Not on file   Highest education level:  Not on file  Occupational History   Occupation: Nurse Practitioner    Employer: [employer]  Tobacco Use   Smoking status: Never   Smokeless tobacco: Never  Vaping Use   Vaping Use: Never used  Substance and Sexual Activity   Alcohol use: No   Drug use: No   Sexual activity: Yes  Other Topics Concern   Not on file  Social History Narrative   Married. Daughter 46 years old Montserrat and  Kenard Gower 34 almost 37 year old in 2022. One child passed early -after birth.       FNP endocrinology   App undergrad- graduated [employer]. Masters at [employer].       Regular exercise: yes, biking, run, weight lifting- back to crossfit, mountain biking- slow on this compared to previous with neck      Caffeine use: daily   Social Determinants of Health   Financial Resource Strain: Not on file  Food Insecurity: Not on file  Transportation Needs: Not on file  Physical Activity: Not on file  Stress: Not on file  Social Connections: Not on file  Intimate Partner Violence: Not on file   Current Outpatient Medications on File Prior to Visit  Medication Sig Dispense Refill   Glucagon (GVOKE HYPOPEN 1-PACK) 1 MG/0.2ML SOAJ Use as directed for low blood sugar. 0.4 mL PRN   influenza vac split quadrivalent PF (FLUARIX QUADRIVALENT) 0.5 ML injection Inject 0.5 mLs into the muscle. 0.5 mL 0   insulin degludec (TRESIBA FLEXTOUCH) 100 UNIT/ML SOPN FlexTouch Pen Inject 0.2-0.26 mLs (20-26 Units total) into the skin daily. 5 pen 5   Insulin Disposable Pump (OMNIPOD 5 G6 POD, GEN 5,) MISC Use every other day. 15 each 11   insulin lispro (HUMALOG) 100 UNIT/ML injection Inject up to 75 units into the pump daily as advised 30 mL 3   Insulin Pen Needle (BD PEN NEEDLE NANO U/F) 32G X 4 MM MISC Use 4x a day 200 each 11   meloxicam (MOBIC) 15 MG tablet Take 1 tablet (15 mg total) by mouth daily. 60 tablet 1   ondansetron (ZOFRAN-ODT) 4 MG disintegrating tablet Take 1 tablet (4 mg total) by mouth every 8 (eight) hours as needed for nausea  or vomiting. 20 tablet 1   No current facility-administered medications on file prior to visit.   Allergies  Allergen Reactions   Penicillins Rash   Penicillin G Nausea And Vomiting   Family History  Problem Relation Age of Onset   Thyroid disease Mother    Hyperlipidemia Mother    Depression Mother    GER disease Mother    Hypertension Mother    Other Mother        prediabetes   Hyperlipidemia Father    Alcohol abuse Maternal Grandmother    Diabetes Maternal Grandmother    Cancer Maternal Grandfather        colon and prostate. age 58 in 2018   Stroke Maternal Grandfather        in 83s   Dementia Maternal Grandfather        vascular   Healthy Sister    PE: BP 120/70   Pulse 69   Ht 5\' 9"  (1.753 m)   Wt 168 lb (76.2 kg)   SpO2 99%   BMI 24.81 kg/m   Wt Readings from Last 3 Encounters:  12/29/21 173 lb 12.8 oz (78.8 kg)  12/27/21 177 lb (80.3 kg)  04/16/21 173 lb (78.5 kg)   Constitutional: normal weight, fit, in NAD Eyes:  EOMI, no exophthalmos ENT: no neck masses, no cervical lymphadenopathy Cardiovascular: RRR, No MRG Respiratory: CTA B Musculoskeletal: no deformities Skin:no rashes Neurological: + Mild tremor with outstretched hands  ASSESSMENT: 1. DM1, controlled, with complications:  - Mild NPDR OU  2. Hashimoto thyroiditis - Euthyroid  PLAN:  1. Patient with longstanding, well-controlled, type 1 diabetes, with a history of hypoglycemic episodes, now improved.  He is trying different pumps, alternating between OmniPod and t:slim insulin  pumps in the past, but then the BetaBionic iLet pump and most recently, the Tandem Mobi pump, which he likes the most.  He did not like the iLet pump as he could not bolus for snacks or control the pump in any way.  He also has a Dexcom G6 CGM, integrated with the pump. -At last visit, reviewing the CGM trends, sugars were mostly fluctuating within the target range with only an occasional higher blood sugar around the  holidays, with an URI, or when he had the pump site failure.  We did not change his regimen.  He does a good job adjusting the settings based on his blood sugars. -He has a G-Voke pen at home.  Baqsimi was not covered by his insurance. CGM interpretation: -At today's visit, we reviewed his CGM downloads: It appears that 73% of values are in target range (goal >70%), while 24% are higher than 180 (goal <25%), and 3% are lower than 70 (goal <4%).  The calculated average blood sugar is 145.  The projected HbA1c for the next 3 months (GMI) is 6.8%. -Reviewing the CGM trends, sugars appear to be fluctuating within the target range but he does have more hyper glycemic spikes occasionally after lunch and more so after dinner.  He attributes the hyperglycemia after dinner to exercising before dinner with occasional lower blood sugars around dinnertime and rebound hyperglycemia afterwards.  He is planning to move his exercise sessions only in the morning.  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 -He did not make changes in his pump settings since last visit, more recently he increased his basal rate at 9 PM due to the higher blood sugars around that time.  At today's visit, I did not suggest any changes the pump settings.  He is very knowledgeable and continues to adjust the settings as needed based on his blood sugar patterns. -I advised him to: Patient Instructions  Please use the following pump settings - Basal rates:   12 am: 0.350, ISF 1:85, ICR 1:20 3 am : 0.700, ISF 1:75, ICR 1:14 6 am: 0.800, ISF 1:45, ICR 1:9 10 am: 0.750, ISF 1:45, ICR 1:8 4 pm: 0.800, ISF 1:45, ICR 1:9 9 pm: 0.650, ISF 1:60, ICR 1:12 - target CBG: 110 - Active insulin time: 5 h  Please stop at the lab.  Please return in 6 months.  - we checked his HbA1c: 6.1% (at goal, only slightly higher than before) - advised to check sugars at  different times of the day - 4x a day, rotating check times - advised for yearly eye exams >> he is UTD - return to clinic in 6 months  2. Hashimoto's thyroiditis -No signs or symptoms of hypothyroidism -Most recent TFTs reviewed and the TSH was normal, but he had another TSH obtained on Dr's day and this was slightly above the upper limit of the target range. -Does have some fatigue and longstanding cold intolerance.  No weight gain. -will recheck his TFTs today   Component     Latest Ref Rng 11/04/2022  TSH     0.35 - 5.50 uIU/mL 2.38   T4,Free(Direct)     0.60 - 1.60 ng/dL 1.61   Triiodothyronine,Free,Serum     2.3 - 4.2 pg/mL 2.4   Thyroid tests are all normal.  Carlus Pavlov, MD PhD [facility] Endocrinology

## 2022-11-11 DIAGNOSIS — F411 Generalized anxiety disorder: Secondary | ICD-10-CM | POA: Diagnosis not present

## 2022-11-11 DIAGNOSIS — F5001 Anorexia nervosa, restricting type: Secondary | ICD-10-CM | POA: Diagnosis not present

## 2022-11-20 LAB — HEMOGLOBIN A1C: Hemoglobin A1C: 6.1

## 2022-12-01 ENCOUNTER — Encounter: Payer: Self-pay | Admitting: Family Medicine

## 2022-12-10 ENCOUNTER — Encounter (INDEPENDENT_AMBULATORY_CARE_PROVIDER_SITE_OTHER): Payer: Self-pay | Admitting: Ophthalmology

## 2022-12-16 DIAGNOSIS — F411 Generalized anxiety disorder: Secondary | ICD-10-CM | POA: Diagnosis not present

## 2022-12-16 DIAGNOSIS — F5001 Anorexia nervosa, restricting type: Secondary | ICD-10-CM | POA: Diagnosis not present

## 2022-12-22 ENCOUNTER — Encounter: Payer: Self-pay | Admitting: Family Medicine

## 2022-12-22 ENCOUNTER — Other Ambulatory Visit: Payer: Self-pay

## 2022-12-22 ENCOUNTER — Ambulatory Visit (INDEPENDENT_AMBULATORY_CARE_PROVIDER_SITE_OTHER): Payer: 59 | Admitting: Family Medicine

## 2022-12-22 VITALS — BP 130/80 | HR 59 | Temp 97.8°F | Ht 69.0 in | Wt 167.8 lb

## 2022-12-22 DIAGNOSIS — E109 Type 1 diabetes mellitus without complications: Secondary | ICD-10-CM | POA: Diagnosis not present

## 2022-12-22 DIAGNOSIS — Z0000 Encounter for general adult medical examination without abnormal findings: Secondary | ICD-10-CM | POA: Diagnosis not present

## 2022-12-22 DIAGNOSIS — E063 Autoimmune thyroiditis: Secondary | ICD-10-CM

## 2022-12-22 NOTE — Addendum Note (Signed)
Addended by: Nash Shearer D on: 12/22/2022 04:38 PM   Modules accepted: Orders

## 2022-12-22 NOTE — Progress Notes (Signed)
Phone: [phone]    Subjective:  Patient presents today for their annual physical. Chief complaint-noted.   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    Priority: Low   GERD (gastroesophageal reflux disease) 01/07/2013    Priority: Low   History of anorexia nervosa 01/07/2013    Priority: Low   Chronic insomnia 09/01/2020   Other specified joint disorders, left hip 03/22/2019   Past Surgical History:  Procedure Laterality Date   APPENDECTOMY     LASIK     2013 or 2014   WISDOM TOOTH EXTRACTION      Family History  Problem Relation Age of Onset   Thyroid disease Mother    Hyperlipidemia Mother    Depression Mother    GER disease Mother    Hypertension Mother    Other Mother        prediabetes   Hyperlipidemia Father    Alcohol abuse Maternal Grandmother    Diabetes Maternal Grandmother    Cancer Maternal Grandfather        colon and prostate. age 55 in 2018   Stroke Maternal Grandfather        in 56s   Dementia Maternal Grandfather        vascular   Healthy Sister     Medications- reviewed and updated Current Outpatient Medications  Medication Sig Dispense Refill   Glucagon (GVOKE HYPOPEN 1-PACK) 1 MG/0.2ML SOAJ Use as directed for low blood sugar. 0.4 mL PRN   insulin degludec (TRESIBA FLEXTOUCH) 100 UNIT/ML SOPN FlexTouch Pen Inject  0.2-0.26 mLs (20-26 Units total) into the skin daily. 5 pen 5   insulin lispro (HUMALOG) 100 UNIT/ML injection Inject up to 75 units into the pump daily as advised 30 mL 3   Insulin Pen Needle (BD PEN NEEDLE NANO U/F) 32G X 4 MM MISC Use 4x a day 200 each 11   meloxicam (MOBIC) 15 MG tablet Take 1 tablet (15 mg total) by mouth daily. 60 tablet 1   ondansetron (ZOFRAN-ODT) 4 MG disintegrating tablet Take 1 tablet (4 mg total) by mouth every 8 (eight) hours as needed for nausea or vomiting. (Patient not taking: Reported on 12/22/2022) 20 tablet 1   No current facility-administered medications for this visit.    Allergies-reviewed and updated Allergies  Allergen Reactions   Penicillins Rash   Penicillin G Nausea And Vomiting    Social History   Social History Narrative   Married. Daughter 22 years old Valentina Gu and Kenard Gower 20 almost 37 year old in 2022. One child passed early -after birth.       Finished doctorate may 2024- out of [facility]   FNP endocrinology   App undergrad- graduated [employer]. Masters at [employer].       Regular exercise: yes, biking, run, weight lifting- back to crossfit, mountain biking-  slow on this compared to previous with neck      Caffeine use: daily     Objective:  BP 130/80   Pulse (!) 59   Temp 97.8 F (36.6 C)   Ht 5\' 9"  (1.753 m)   Wt 167 lb 12.8 oz (76.1 kg)   SpO2 99%   BMI 24.78 kg/m  Gen: NAD, resting comfortably HEENT: Mucous membranes are moist. Oropharynx normal Neck: no thyromegaly CV: RRR no murmurs rubs or gallops Lungs: CTAB no crackles, wheeze, rhonchi Abdomen: soft/nontender/nondistended/normal bowel sounds. No rebound or guarding.  Ext: no edema Skin: warm, dry Neuro: grossly normal, moves all extremities, PERRLA     Assessment and Plan:  37 y.o. male presenting for annual physical.  Health Maintenance counseling: 1. Anticipatory guidance: Patient counseled regarding regular dental exams -q6 months, eye exams - regularly with Dr. Vanessa Barbara,  avoiding  smoking and second hand smoke , limiting alcohol to 2 beverages per day- doesn't drink, no illicit drugs.   2. Risk factor reduction:  Advised patient of need for regular exercise and diet rich and fruits and vegetables to reduce risk of heart attack and stroke.  Exercise- crossfit  with his own set up at home and running lately.  Diet/weight management- healthy weight- reasonably healthy diet 3. Immunizations/screenings/ancillary studies- opts out prevnar 20  Immunization History  Administered Date(s) Administered   Influenza,inj,Quad PF,6+ Mos 02/11/2022   Influenza-Unspecified 02/14/2012, 02/12/2013, 02/13/2017   PFIZER(Purple Top)SARS-COV-2 Vaccination 03/25/2020   Pneumococcal Polysaccharide-23 05/24/2012   Tdap 03/26/2008, 11/29/2018  4. Prostate cancer screening-  no early family history, start at age 52-55 - grandfather in 57's. Did get PSA with doctors day labs and <0.1   5. Colon cancer screening - no family history, start at age 22. Grandfather with 60's with small bowel issues- possible polyps- had surgery.  6. Skin cancer screening/prevention- sees dermatology as needed - no recent issues. advised regular sunscreen use. Denies worrisome, changing, or new skin lesions.  7. Testicular cancer screening- advised monthly self exams  8. STD screening- patient opts  out- only active with wife 9. Smoking associated screening- never smoker  Status of chronic or acute concerns   # Depression S: Medication:none at present -[employer] counseling q 2-4 weeks. Going since loss of child prior to 2018 From last note "-Started around college after wife's mom committed suicide- also dealt with anorexia issues which worsened wafter that. Later lost Clark early in life.  -first tried wellbutrin and effexor in college which worsened symptoms - has tried zoloft  up to 150 mg, prozac, lexapro 15 mg- but on each ends up getting GI upset. Most recently retried prozac just 10 mg but with GI  upset" -refer to psychiatrist last year for their opinion      12/22/2022    4:20 PM 11/12/2021    1:15 PM 11/05/2020   12:57 PM  Depression screen PHQ 2/9  Decreased Interest 0 1 0  Down, Depressed, Hopeless 0 2 0  PHQ - 2 Score 0 3 0  Altered sleeping 3 3 0  Tired, decreased energy 0 0 0  Change in appetite 0 0 0  Feeling bad or failure about yourself  0 1 0  Trouble concentrating 0 2 1  Moving slowly or fidgety/restless 0 0 0  Suicidal thoughts 0 0 0  PHQ-9 Score 3 9 1   Difficult doing work/chores Somewhat difficult Somewhat difficult Not difficult at all  A/P: feels much more level lately- ok remaining off medications-  did get short term psychiatry virtual opinion but medications were really far too aggressive in recommendation -July still rougher month  # diabetes type I on insulin pump and follow up with Dr. Elvera Lennox . No significant lows- working closely with Dr. Elvera Lennox Lab Results  Component Value Date   HGBA1C 6.1 11/20/2022   HGBA1C 6.0 (A) 10/15/2021   HGBA1C 5.7 (A) 10/16/2020   # Hashimoto's thyroiditis-still actively monitoring but has not needed therapy - TSH was high but repeated later by Dr. Elvera Lennox and normal  #on doctors day labs reports Low Density Lipoprotein (LDL cholesterol) down to 60's again most recently- he is sending [place] a copy Recommended follow up: Return in about 1 year (around 12/22/2023) for physical or sooner if needed.Schedule b4 you leave. Future Appointments  Date Time Provider Department Center  12/30/2022 12:45 PM Rennis Chris, MD [facility] None  05/05/2023 11:20 AM Carlus Pavlov, MD [facility] None   Lab/Order associations: has had doctors day labs and will send [place] a copy   ICD-10-CM   1. Preventative health care  Z00.00     2. Controlled diabetes mellitus type 1 without complications (HCC)  E10.9 Microalbumin / creatinine urine ratio      No orders of the defined types were placed in this encounter.   Return precautions advised.    Tana Conch, MD

## 2022-12-22 NOTE — Patient Instructions (Addendum)
Please stop by lab before you go If you have mychart- we will send your results within 3 business days of [place] receiving them.  If you do not have mychart- we will call you about results within 5 business days of [place] receiving them.  *please also note that you will see labs on mychart as soon as they post. I will later go in and write notes on them- will say "notes from Dr. Durene Cal"   Recommended follow up: Return in about 1 year (around 12/22/2023) for physical or sooner if needed.Schedule b4 you leave.

## 2022-12-28 NOTE — Addendum Note (Signed)
Addended by: Pollie Meyer on: 12/28/2022 04:37 PM   Modules accepted: Orders

## 2022-12-28 NOTE — Progress Notes (Signed)
[facility] - Clinic Note  12/30/2022     CHIEF COMPLAINT Patient presents for Retina Follow Up   HISTORY OF PRESENT ILLNESS: Mark Barr is a 37 y.o. male who presents to the clinic today for:   HPI     Retina Follow Up   Patient presents with  Diabetic Retinopathy.  Since onset it is stable.  I, the attending physician,  performed the HPI with the patient and updated documentation appropriately.        Comments   Patient states that he feels the vision in the left eye is blurry since last visit after having a PEP laser. He is using AT's OU PRN. His blood sugar was 129.      Last edited by Rennis Chris, MD on 01/01/2023 10:47 PM.     Pt states he has a blurry spot in his left eye, he states his right eye is "uber clear", but the left eye has a fuzzy spot, he thinks it may be related to his astigmatism, he notices it mostly when he is looking at screens   Referring physician: Shelva Majestic, MD [address] [place],  [place] [address]  HISTORICAL INFORMATION:   Selected notes from the MEDICAL RECORD NUMBER Referred by Dr. Charlotte Sanes for concern of CRVO OS LEE:  Ocular Hx- PMH-    CURRENT MEDICATIONS: No current outpatient medications on file. (Ophthalmic Drugs)   No current facility-administered medications for this visit. (Ophthalmic Drugs)   Current Outpatient Medications (Other)  Medication Sig   Glucagon (GVOKE HYPOPEN 1-PACK) 1 MG/0.2ML SOAJ Use as directed for low blood sugar.   insulin degludec (TRESIBA FLEXTOUCH) 100 UNIT/ML SOPN FlexTouch Pen Inject 0.2-0.26 mLs (20-26 Units total) into the skin daily.   insulin lispro (HUMALOG) 100 UNIT/ML injection Inject up to 75 units into the pump daily as advised   Insulin Pen Needle (BD PEN NEEDLE NANO U/F) 32G X 4 MM MISC Use 4x a day   meloxicam (MOBIC) 15 MG tablet Take 1 tablet (15 mg total) by mouth daily.   ondansetron (ZOFRAN-ODT) 4 MG disintegrating tablet Take 1 tablet (4 mg  total) by mouth every 8 (eight) hours as needed for nausea or vomiting.   No current facility-administered medications for this visit. (Other)   REVIEW OF SYSTEMS: ROS   Positive for: Endocrine, Eyes Negative for: Constitutional, Gastrointestinal, Neurological, Skin, Genitourinary, Musculoskeletal, HENT, Cardiovascular, Respiratory, Psychiatric, Allergic/Imm, Heme/Lymph Last edited by Charlette Caffey, COT on 12/30/2022 12:42 PM.     ALLERGIES Allergies  Allergen Reactions   Penicillins Rash   Penicillin G Nausea And Vomiting   PAST MEDICAL HISTORY Past Medical History:  Diagnosis Date   Anxiety    Chicken pox    Depression    Diabetes mellitus without complication (HCC)    Diabetic retinopathy (HCC)    Eating disorder    Past Surgical History:  Procedure Laterality Date   APPENDECTOMY     LASIK     2013 or 2014   WISDOM TOOTH EXTRACTION     FAMILY HISTORY Family History  Problem Relation Age of Onset   Thyroid disease Mother    Hyperlipidemia Mother    Depression Mother    GER disease Mother    Hypertension Mother    Other Mother        prediabetes   Hyperlipidemia Father    Alcohol abuse Maternal Grandmother    Diabetes Maternal Grandmother    Cancer Maternal Grandfather  colon and prostate. age 61 in 2018   Stroke Maternal Grandfather        in 64s   Dementia Maternal Grandfather        vascular   Healthy Sister    SOCIAL HISTORY Social History   Tobacco Use   Smoking status: Never   Smokeless tobacco: Never  Vaping Use   Vaping status: Never Used  Substance Use Topics   Alcohol use: No   Drug use: No       OPHTHALMIC EXAM:  Base Eye Exam     Visual Acuity (Snellen - Linear)       Right Left   Dist [place] 20/20 20/20 +2         Tonometry (Tonopen, 12:45 PM)       Right Left   Pressure 15 12         Pupils       Dark Light Shape React APD   Right 4 3 Round Brisk None   Left 4 3 Round Brisk None         Visual  Fields       Left Right    Full Full         Extraocular Movement       Right Left    Full, Ortho Full, Ortho         Neuro/Psych     Oriented x3: Yes   Mood/Affect: Normal         Dilation     Both eyes: 1.0% Mydriacyl, 2.5% Phenylephrine @ 12:43 PM           Slit Lamp and Fundus Exam     Slit Lamp Exam       Right Left   Lids/Lashes Normal Normal   Conjunctiva/Sclera White and quiet White and quiet   Cornea well healed barely visible lasik flap well healed barely visible lasik flap   Anterior Chamber deep and clear deep and clear   Iris Round and dilated, No NVI Round and dilated, No NVI   Lens Clear Clear   Anterior Vitreous mild syneresis mild syneresis         Fundus Exam       Right Left   Disc Pink and Sharp, +PPP, no NVD Pink and Sharp, +PPP, no NVD   C/D Ratio 0.3 0.2   Macula Flat, Good foveal reflex, rare punctate MA -- improved Flat, Good foveal reflex, rare punctate MA   Vessels mild attenuation, mild tortuosity, mild copper wiring, no NV attenuated, Tortuous, mild copper wiring, +IRMA peripheral ST arcades, no NV   Periphery Attached, scattered MA -- rare, greatest temporal quad Attached, scattered MA / DBH -- small focal cluster ST quad improved; +IRMA; good segmental PRP and fill in temporal periphery           IMAGING AND PROCEDURES  Imaging and Procedures for 12/30/2022  OCT, Retina - OU - Both Eyes       Right Eye Quality was good. Central Foveal Thickness: 285. Progression has been stable. Findings include normal foveal contour, no IRF, no SRF, vitreomacular adhesion .   Left Eye Quality was good. Central Foveal Thickness: 299. Progression has been stable. Findings include normal foveal contour, no IRF, no SRF, vitreomacular adhesion .   Notes *Images captured and stored on drive  Diagnosis / Impression:  NFP, no IRF/SRF OU No DME OU  Clinical management:  See below  Abbreviations: NFP - Normal foveal profile. CME  -  cystoid macular edema. PED - pigment epithelial detachment. IRF - intraretinal fluid. SRF - subretinal fluid. EZ - ellipsoid zone. ERM - epiretinal membrane. ORA - outer retinal atrophy. ORT - outer retinal tubulation. SRHM - subretinal hyper-reflective material. IRHM - intraretinal hyper-reflective material      Fluorescein Angiography Optos (Transit OS)       Right Eye Progression has been stable. Early phase findings include microaneurysm. Mid/Late phase findings include microaneurysm (No significant leakage; no NV).   Left Eye Progression has improved. Early phase findings include staining, microaneurysm, vascular perfusion defect. Mid/Late phase findings include leakage, staining, microaneurysm, vascular perfusion defect (+IRMA -- peripheral ST arcades; no frank NVE; vascular perfusion defects and +perivascular leakage temporal periphery; late leaking MA, staining of segmental PRP temporal periphery -- good fill in).   Notes **Images stored on drive**  Impression: Mild-Moderate NPDR OU OD: scattered MA; no significant leakage; no NV OS: +IRMA -- peripheral ST arcades; no frank NVE; vascular perfusion defects and +perivascular leakage temporal periphery; late leaking MA, staining of segmental PRP temporal periphery -- good fill in            ASSESSMENT/PLAN:    ICD-10-CM   1. 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 (HCC)  E10.3293 OCT, Retina - OU - Both Eyes    Fluorescein Angiography Optos (Transit OS)    2. Encounter for long-term (current) use of insulin (HCC)  Z79.4      1,2.  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, both eyes - BCVA 20/20 OU - OCT without diabetic macular edema, both eyes  - exam shows scattered MA OU (OS > OD); OS w/ focal cluster of DBH temporal periphery improving and +IRMA - FA (09.20.23) shows MA OU; OS with peripheral nonperfusion and corresponding  leakage - s/p segmental PRP laser OS (10.09.23) - temporal periphery, fill in segmental PRP (04.17.24) - repeat FA (03.22.24) shows +MA, no NV OU; OS: +IRMA -- peripheral ST arcades; no frank NVE; vascular perfusion defects and +perivascular leakage temporal periphery; late leaking MA, staining of segmental PRP temporal periphery -- room for fill-in of vascular nonperfusion  - repeat FA (08.16.24) shows good fill in laser for vascular nonperfusion - discussed findings, prognosis and treatment options - no retinal or ophthalmic interventions indicated or recommended  - f/u 9 months, DFE, OCT,    Ophthalmic Meds Ordered this visit:  No orders of the defined types were placed in this encounter.    Return in about 9 months (around 09/29/2023) for f/u NPDR OU, DFE, OCT.  There are no Patient Instructions on file for this visit.   This document serves as a record of services personally performed by Karie Chimera, MD, PhD. It was created on their behalf by Glee Arvin. Manson Passey, OA an ophthalmic technician. The creation of this record is the provider's dictation and/or activities during the visit.    Electronically signed by: Glee Arvin. Manson Passey, OA 01/01/23 10:50 PM  Karie Chimera, M.D., Ph.D. Diseases & Surgery of the Retina and Vitreous [facility] 12/30/2022   I have reviewed the above documentation for accuracy and completeness, and I agree with the above. Karie Chimera, M.D., Ph.D. 01/01/23 10:54 PM    Abbreviations: M myopia (nearsighted); A astigmatism; H hyperopia (farsighted); P presbyopia; Mrx spectacle prescription;  CTL contact lenses; OD right eye; OS left eye; OU both eyes  XT exotropia; ET esotropia; PEK punctate epithelial keratitis;  PEE punctate epithelial erosions; DES dry eye syndrome; MGD meibomian gland dysfunction; ATs artificial tears; PFAT's preservative free artificial tears; NSC nuclear sclerotic cataract; PSC posterior subcapsular cataract; ERM  epi-retinal membrane; PVD posterior vitreous detachment; RD retinal detachment; DM diabetes mellitus; DR diabetic retinopathy; NPDR non-proliferative diabetic retinopathy; PDR proliferative diabetic retinopathy; CSME clinically significant macular edema; DME diabetic macular edema; dbh dot blot hemorrhages; CWS cotton wool spot; POAG primary open angle glaucoma; C/D cup-to-disc ratio; HVF humphrey visual field; GVF goldmann visual field; OCT optical coherence tomography; IOP intraocular pressure; BRVO Branch retinal vein occlusion; CRVO central retinal vein occlusion; CRAO central retinal artery occlusion; BRAO branch retinal artery occlusion; RT retinal tear; SB scleral buckle; PPV pars plana vitrectomy; VH Vitreous hemorrhage; PRP panretinal laser photocoagulation; IVK intravitreal kenalog; VMT vitreomacular traction; MH Macular hole;  NVD neovascularization of the disc; NVE neovascularization elsewhere; AREDS age related eye disease study; ARMD age related macular degeneration; POAG primary open angle glaucoma; EBMD epithelial/anterior basement membrane dystrophy; ACIOL anterior chamber intraocular lens; IOL intraocular lens; PCIOL posterior chamber intraocular lens; Phaco/IOL phacoemulsification with intraocular lens placement; PRK photorefractive keratectomy; LASIK laser assisted in situ keratomileusis; HTN hypertension; DM diabetes mellitus; COPD chronic obstructive pulmonary disease

## 2022-12-30 ENCOUNTER — Ambulatory Visit (INDEPENDENT_AMBULATORY_CARE_PROVIDER_SITE_OTHER): Payer: 59 | Admitting: Ophthalmology

## 2022-12-30 ENCOUNTER — Encounter (INDEPENDENT_AMBULATORY_CARE_PROVIDER_SITE_OTHER): Payer: Self-pay | Admitting: Ophthalmology

## 2022-12-30 DIAGNOSIS — Z794 Long term (current) use of insulin: Secondary | ICD-10-CM

## 2022-12-30 DIAGNOSIS — E103293 Type 1 diabetes mellitus with mild nonproliferative diabetic retinopathy without macular edema, bilateral: Secondary | ICD-10-CM

## 2023-01-01 ENCOUNTER — Encounter (INDEPENDENT_AMBULATORY_CARE_PROVIDER_SITE_OTHER): Payer: Self-pay | Admitting: Ophthalmology

## 2023-01-13 DIAGNOSIS — F411 Generalized anxiety disorder: Secondary | ICD-10-CM | POA: Diagnosis not present

## 2023-01-13 DIAGNOSIS — F5001 Anorexia nervosa, restricting type: Secondary | ICD-10-CM | POA: Diagnosis not present

## 2023-01-18 ENCOUNTER — Other Ambulatory Visit: Payer: Self-pay | Admitting: Internal Medicine

## 2023-01-18 ENCOUNTER — Other Ambulatory Visit (HOSPITAL_COMMUNITY): Payer: Self-pay

## 2023-01-18 MED ORDER — INSULIN LISPRO 100 UNIT/ML IJ SOLN
75.0000 [IU] | Freq: Every day | INTRAMUSCULAR | 3 refills | Status: DC
  Filled 2023-01-18: qty 30, 40d supply, fill #0
  Filled 2023-02-28: qty 30, 40d supply, fill #1
  Filled 2023-05-01: qty 30, 40d supply, fill #2
  Filled 2023-06-20: qty 30, 40d supply, fill #3

## 2023-01-29 ENCOUNTER — Encounter: Payer: Self-pay | Admitting: Family Medicine

## 2023-01-30 ENCOUNTER — Other Ambulatory Visit (HOSPITAL_COMMUNITY): Payer: Self-pay

## 2023-01-30 MED ORDER — DEXLANSOPRAZOLE 30 MG PO CPDR
30.0000 mg | DELAYED_RELEASE_CAPSULE | Freq: Every day | ORAL | 11 refills | Status: DC
  Filled 2023-01-30 (×2): qty 30, 30d supply, fill #0

## 2023-02-06 DIAGNOSIS — E109 Type 1 diabetes mellitus without complications: Secondary | ICD-10-CM | POA: Diagnosis not present

## 2023-02-17 DIAGNOSIS — F411 Generalized anxiety disorder: Secondary | ICD-10-CM | POA: Diagnosis not present

## 2023-03-01 ENCOUNTER — Other Ambulatory Visit: Payer: Self-pay

## 2023-03-01 ENCOUNTER — Other Ambulatory Visit (HOSPITAL_COMMUNITY): Payer: Self-pay

## 2023-03-01 MED ORDER — PANTOPRAZOLE SODIUM 40 MG PO TBEC
40.0000 mg | DELAYED_RELEASE_TABLET | Freq: Every day | ORAL | 3 refills | Status: DC
  Filled 2023-03-01 – 2023-03-08 (×2): qty 90, 90d supply, fill #0
  Filled 2023-07-10 (×2): qty 90, 90d supply, fill #1
  Filled 2023-10-12: qty 90, 90d supply, fill #2

## 2023-03-06 ENCOUNTER — Other Ambulatory Visit (HOSPITAL_BASED_OUTPATIENT_CLINIC_OR_DEPARTMENT_OTHER): Payer: Self-pay

## 2023-03-08 ENCOUNTER — Other Ambulatory Visit: Payer: Self-pay

## 2023-03-08 ENCOUNTER — Other Ambulatory Visit (HOSPITAL_COMMUNITY): Payer: Self-pay

## 2023-03-09 ENCOUNTER — Other Ambulatory Visit: Payer: Self-pay

## 2023-03-28 ENCOUNTER — Other Ambulatory Visit: Payer: Self-pay

## 2023-03-28 ENCOUNTER — Encounter: Payer: Self-pay | Admitting: Internal Medicine

## 2023-03-28 MED ORDER — DEXCOM G7 SENSOR MISC
1.0000 | 3 refills | Status: DC
  Filled 2023-03-28: qty 3, 30d supply, fill #0
  Filled 2023-05-01: qty 3, 30d supply, fill #1
  Filled 2023-06-20: qty 3, 30d supply, fill #2
  Filled 2023-08-03: qty 3, 30d supply, fill #3
  Filled 2023-09-03: qty 3, 30d supply, fill #4
  Filled 2023-10-05: qty 3, 30d supply, fill #5
  Filled 2023-11-27: qty 1, 10d supply, fill #6
  Filled 2023-12-01 – 2023-12-02 (×3): qty 3, 30d supply, fill #6
  Filled 2024-01-05: qty 3, 30d supply, fill #7

## 2023-03-31 ENCOUNTER — Other Ambulatory Visit: Payer: Self-pay

## 2023-05-01 ENCOUNTER — Other Ambulatory Visit (HOSPITAL_BASED_OUTPATIENT_CLINIC_OR_DEPARTMENT_OTHER): Payer: Self-pay

## 2023-05-05 ENCOUNTER — Encounter: Payer: Self-pay | Admitting: Internal Medicine

## 2023-05-05 ENCOUNTER — Ambulatory Visit: Payer: 59 | Admitting: Internal Medicine

## 2023-05-05 VITALS — BP 126/70 | HR 74 | Ht 69.0 in | Wt 164.0 lb

## 2023-05-05 DIAGNOSIS — E109 Type 1 diabetes mellitus without complications: Secondary | ICD-10-CM | POA: Diagnosis not present

## 2023-05-05 DIAGNOSIS — E063 Autoimmune thyroiditis: Secondary | ICD-10-CM

## 2023-05-05 NOTE — Patient Instructions (Addendum)
Please use the following pump settings  12 am: 0.350, ISF 1:90, ICR 1:25 3 am : 0.700, ISF 1:75, ICR 1:14 6 am: 0.800, ISF 1:45, ICR 1:9 10 am: 0.750, ISF 1:45, ICR 1:8 45 pm: 0.900, ISF 1:40, ICR 1:9 9 pm: 0.700, ISF 1:65, ICR 1:14 - target CBG: 110 - Active insulin time: 5 h  Try to bolus for dinner 15 min before the meal.  Please return in 6 months.

## 2023-05-05 NOTE — Progress Notes (Signed)
Patient ID: Mark Barr, male   DOB: May 19, 1985, 37 y.o.   MRN: [id number]  HPI: Mark Barr is a 37 y.o.-year-old male, returning for f/u for DM1, dx 48 (at 37 y/o), controlled, w/o complications, on insulin pump since 2000. Last visit 6 months ago.  Interim history: No increased urination, blurry vision, nausea, chest pain.  He continues to exercise consistently -CrossFit and previously running, but he hurt his hip recently and is now only walking.  Insulin pump: - Medtronic Revel 530 G loaner - Omnipod since12/09/2013 >> loved it, however he developed a blistering a rash and the attaching site (started to use Flonase before attaching the pump, which helped) - Medtronic 723  - Medtronic 670G since 10/2015 - T:slim X2 since 03/2017.  He is using the control IQ technology. - Omnipod 5 alternating with tandem t:slim pump  -currently on the tandem pump - tried the iLet pump - did not like it  - now on Tandem Mobi loaner, but would like to switch to this pump definitively - now  Mobi through [place] - had to replace the pump recently due to malfunctioning  CGM: -Dexcom G6 since 01/2017 >> G7 -supplies from [employer]  Insulin: -He initially tried NovoLog and Fiasp in the pump, but did not see a difference between the 2 in the 670 G pump.   -After he switched to the T slim pump, he felt [place] was working better for him -However, he is now using Humalog per insurance preference -We tried Lyumjev 04/2020 but this caused burning at the infusion site so he stopped  Reviewed HbA1c levels: Lab Results  Component Value Date   HGBA1C 6.1 11/20/2022   HGBA1C 6.1 (A) 11/04/2022   HGBA1C 6.0 (A) 10/15/2021   HGBA1C 5.7 (A) 10/16/2020   HGBA1C 5.7 08/05/2019   HGBA1C 6.4 (A) 11/23/2018   HGBA1C 6.3 04/16/2018   HGBA1C 6.6 (H) 02/26/2016   HGBA1C 6.5 10/01/2015   HGBA1C 7.1 (H) 06/25/2015   HGBA1C 6.8 02/19/2015   HGBA1C 6.5 08/25/2014   HGBA1C 7.2 (H) 04/28/2014   HGBA1C 6.9 (H)  12/24/2013   HGBA1C 6.1 09/16/2013  04/16/2021: HbA1c 6.3% 04/10/2018: HbA1c 6.3% 09/15/2017: HbA1c 6.4%  Pump settings:    12 am: 0.350, ISF 1:90, ICR 1:25 3 am : 0.700, ISF 1:75, ICR 1:14 6 am: 0.800, ISF 1:45, ICR 1:9 10 am: 0.750, ISF 1:45, ICR 1:8 45 pm: 0.900, ISF 1:40, ICR 1:9 9 pm: 0.700, ISF 1:65, ICR 1:14 - target CBG: 110 - Active insulin time: 5 h  CGM downloads:   Previously:  Previously:   Total daily dose from basal: 219 units (45%) >> same >> 37% (15 units) >> 37% Total daily dose from bolus: 23 units (54%) >> same >> 62% (26 units) >> 63% Uses 39-60 units a day. - highest CBG:  300 >> 250s- pump site failure >> 200s in the last 2 weeks - lowest CBG: 45 >> 40s (increased activity) >> 47 in the last 2 weeks.  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   -No CKD: 09/05/2022: need records Lab Results  Component Value Date   BUN 8 12/29/2021   CREATININE 1.04  12/29/2021   -Normal ACR: Lab Results  Component Value Date   MICRALBCREAT 3 12/22/2022   MICRALBCREAT 1.2 06/18/2021   MICRALBCREAT 2.4 04/08/2020   MICRALBCREAT NOTE 11/23/2018   MICRALBCREAT 0.9 02/26/2016   MICRALBCREAT 2.4 02/23/2015   MICRALBCREAT 0.5 12/24/2013   MICRALBCREAT 0.3 02/11/2013   -No HL; last set of lipids:  09/05/2022: 159/60/65/82 Lab Results  Component Value Date   CHOL 177 06/18/2021   HDL 64.10 06/18/2021   LDLCALC 103 (H) 06/18/2021   TRIG 51.0 06/18/2021   CHOLHDL 3 06/18/2021   - last eye exam was on 12/30/2022 (Dr. Vanessa Barbara): + mild PDR OU.  He is getting laser treatments OS.  - no numbness and tingling in his feet.  Foot exam 04/15/2022 here in clinic and on  07/25/2022 by Dr. Logan Bores.  Hashimoto thyroiditis: -He did not require levothyroxine  Reviewed TFTs:  Lab Results  Component Value Date   TSH 2.38 11/04/2022   TSH 4.30 11/12/2021   TSH 3.70 06/18/2021   TSH 2.67 04/08/2020   TSH 2.58 01/16/2019   TSH 4.84 (H) 11/23/2018   TSH 3.85 09/25/2017   TSH 1.61 09/27/2016   TSH 2.24 02/26/2016   TSH 2.27 06/25/2015   TSH 4.50 02/23/2015   TSH 1.34 12/24/2013   TSH 2.35 02/11/2013   TSH 1.98 10/10/2012  09/05/2022: TSH 5.45 (<4.5)  Lab Results  Component Value Date   FREET4 0.91 11/04/2022   FREET4 0.76 11/12/2021   FREET4 0.71 06/18/2021   FREET4 0.74 04/08/2020   FREET4 1.0 01/16/2019   FREET4 0.9 11/23/2018   FREET4 0.9 09/25/2017   FREET4 0.78 09/27/2016   FREET4 0.75 02/26/2016   FREET4 0.86 06/25/2015   Lab Results  Component Value Date   T3FREE 2.4 11/04/2022   T3FREE 2.8 06/18/2021   T3FREE 2.4 04/08/2020   T3FREE 2.8 01/16/2019   T3FREE 3.0 11/23/2018   T3FREE 2.8 09/25/2017   T3FREE 3.3 02/26/2016   T3FREE 3.5 06/25/2015   T3FREE 2.7 02/11/2013   His antithyroid antibodies were elevated: Component     Latest Ref Rng & Units 06/25/2015  Thyroperoxidase Ab SerPl-aCnc     <9 IU/mL 75 (H)  Thyroglobulin Ab     <2 IU/mL 4 (H)   He had hip surgery 04/2019.  He has 2 daughters.  ROS: + see HPI  I reviewed pt's medications, allergies, PMH, social hx, family hx, and changes were documented in the history of present illness. Otherwise, unchanged from my initial visit note.  Past Medical History:  Diagnosis Date   Anxiety    Chicken pox    Depression    Diabetes mellitus without complication (HCC)    Diabetic retinopathy (HCC)    Eating disorder    Past Surgical History:  Procedure Laterality Date   APPENDECTOMY     LASIK     2013 or 2014   WISDOM TOOTH EXTRACTION     Social History   Socioeconomic History   Marital status: Married    Spouse name: Not on file   Number of children: Not on file    Years of education: Not on file   Highest education level: Not on file  Occupational History   Occupation: Nurse Practitioner    Employer: [employer]  Tobacco Use   Smoking status: Never   Smokeless tobacco: Never  Vaping Use   Vaping status: Never Used  Substance and Sexual Activity   Alcohol use: No   Drug use: No   Sexual activity:  Yes  Other Topics Concern   Not on file  Social History Narrative   Married. Daughter 43 years old Valentina Gu and Kenard Gower 69 almost 37 year old in 2022. One child passed early -after birth.       Finished doctorate may 2024- out of [facility]   FNP endocrinology   App undergrad- graduated [employer]. Masters at [employer].       Regular exercise: yes, biking, run, weight lifting- back to crossfit, mountain biking- slow on this compared to previous with neck      Caffeine use: daily   Social Drivers of Corporate investment banker Strain: Not on file  Food Insecurity: Not on file  Transportation Needs: Not on file  Physical Activity: Not on file  Stress: Not on file  Social Connections: Not on file  Intimate Partner Violence: Not on file   Current Outpatient Medications on File Prior to Visit  Medication Sig Dispense Refill   Continuous Glucose Sensor (DEXCOM G7 SENSOR) MISC Change sensor every 10 days 9 each 3   Dexlansoprazole (DEXILANT) 30 MG capsule DR Take 1 capsule (30 mg total) by mouth daily. 30 capsule 11   Glucagon (GVOKE HYPOPEN 1-PACK) 1 MG/0.2ML SOAJ Use as directed for low blood sugar. 0.4 mL PRN   insulin degludec (TRESIBA FLEXTOUCH) 100 UNIT/ML SOPN FlexTouch Pen Inject 0.2-0.26 mLs (20-26 Units total) into the skin daily. 5 pen 5   insulin lispro (HUMALOG) 100 UNIT/ML injection Inject up to 75 units into the pump daily as advised 30 mL 3   Insulin Pen Needle (BD PEN NEEDLE NANO U/F) 32G X 4 MM MISC Use 4x a day 200 each 11   ondansetron (ZOFRAN-ODT) 4 MG disintegrating tablet Take 1 tablet (4 mg total) by mouth every 8 (eight) hours as needed for nausea or  vomiting. 20 tablet 1   pantoprazole (PROTONIX) 40 MG tablet Take 1 tablet (40 mg total) by mouth daily. 90 tablet 3   No current facility-administered medications on file prior to visit.   Allergies  Allergen Reactions   Penicillins Rash   Penicillin G Nausea And Vomiting   Family History  Problem Relation Age of Onset   Thyroid disease Mother    Hyperlipidemia Mother    Depression Mother    GER disease Mother    Hypertension Mother    Other Mother        prediabetes   Hyperlipidemia Father    Alcohol abuse Maternal Grandmother    Diabetes Maternal Grandmother    Cancer Maternal Grandfather        colon and prostate. age 66 in 2018   Stroke Maternal Grandfather        in 69s   Dementia Maternal Grandfather        vascular   Healthy Sister    PE: BP 126/70   Pulse 74   Ht 5\' 9"  (1.753 m)   Wt 164 lb (74.4 kg)   SpO2 98%   BMI 24.22 kg/m   Wt Readings from Last 3 Encounters:  05/05/23 164 lb (74.4 kg)  12/22/22 167 lb 12.8 oz (76.1 kg)  11/04/22 168 lb (76.2 kg)   Constitutional: normal weight, fit, in NAD Eyes:  EOMI, no exophthalmos ENT: no neck masses, no cervical lymphadenopathy Cardiovascular: RRR, No MRG Respiratory: CTA B Musculoskeletal: no deformities Skin:no rashes Neurological: + Mild tremor with outstretched hands  ASSESSMENT: 1. DM1, controlled, with complications:  - Mild NPDR OU  2. Hashimoto thyroiditis - Euthyroid  PLAN:  1. Patient with no well-controlled type 1 diabetes, with a history of hypoglycemic episodes, now improved.  He is usually alternating between different insulin pump: OmniPod, t:slim, mobi.  He tried the Beta[employer]ionics iLet pump but did not feel that this was controlling his blood sugars.  He liked the t:slim mobi pump the best and he is trying to obtain this. -At last visit, sugars are fluctuating within the target range with only occasional hyperglycemic spikes occasionally after lunch and more so after dinner.  He was  exercising before dinner with occasional low blood sugars around dinnertime and rebound hyperglycemia afterwards.  At last visit he was planning to move his exercise sessions to only in the morning.  We did not change his regimen at that time.  He is usually adjusting the pump settings and is very proficient in doing this. -He has a G-Voke pen at home.  Baqsimi was not covered by his insurance. CGM interpretation: -At today's visit, we reviewed his CGM downloads: It appears that 87% of values are in target range (goal >70%), while 12% are higher than 180 (goal <25%), and 1% are lower than 70 (goal <4%).  The calculated average blood sugar is 137.  The projected HbA1c for the next 3 months (GMI) is 6.6%. -Reviewing the CGM trends, sugars are well-controlled throughout the day and night but they are dropping occasionally after dinner and upon reviewing individual pump records, this could be due to bolusing his Humalog too late, with the sugars already high after meals.  We discussed about trying to bolus 15 minutes before the meal.  Sugars are increasing gradually overnight, possibly due to the previous level.  He also had a strictly basal rate at night, which he recently relaxed.   -Otherwise, sugars appear to be the best he had lately, on the new tandem mobi insulin pump.  Therefore, we will continue the rest of the regimen. -I advised him to: Patient Instructions  Please use the following pump settings  12 am: 0.350, ISF 1:90, ICR 1:25 3 am : 0.700, ISF 1:75, ICR 1:14 6 am: 0.800, ISF 1:45, ICR 1:9 10 am: 0.750, ISF 1:45, ICR 1:8 45 pm: 0.900, ISF 1:40, ICR 1:9 9 pm: 0.700, ISF 1:65, ICR 1:14 - target CBG: 110 - Active insulin time: 5 h  Try to bolus for dinner 15 min before the meal.  Please return in 6 months.  - we checked his HbA1c: 6.3% (excellent) - advised to check sugars at different times of the day - 4x a day, rotating check times - advised for yearly eye exams >> he is UTD -  return to clinic in 6 months  2. Hashimoto's thyroiditis -No signs or symptoms of hypothyroidism except for cold intolerance, which is longstanding. -A TSH obtained per Dr.'s day early 2024 was a slightly above the upper limit of the target range, at 5.45.  We repeated his TFTs at last visit and they were all normal. -Will recheck his TFTs at next visit  Carlus Pavlov, MD PhD [facility]

## 2023-05-26 DIAGNOSIS — F411 Generalized anxiety disorder: Secondary | ICD-10-CM | POA: Diagnosis not present

## 2023-05-26 DIAGNOSIS — F5001 Anorexia nervosa, restricting type, mild: Secondary | ICD-10-CM | POA: Diagnosis not present

## 2023-05-29 ENCOUNTER — Other Ambulatory Visit (HOSPITAL_COMMUNITY): Payer: Self-pay

## 2023-06-09 DIAGNOSIS — F5001 Anorexia nervosa, restricting type, mild: Secondary | ICD-10-CM | POA: Diagnosis not present

## 2023-06-09 DIAGNOSIS — F411 Generalized anxiety disorder: Secondary | ICD-10-CM | POA: Diagnosis not present

## 2023-06-16 DIAGNOSIS — F411 Generalized anxiety disorder: Secondary | ICD-10-CM | POA: Diagnosis not present

## 2023-06-16 DIAGNOSIS — F5001 Anorexia nervosa, restricting type, mild: Secondary | ICD-10-CM | POA: Diagnosis not present

## 2023-06-21 ENCOUNTER — Other Ambulatory Visit: Payer: Self-pay

## 2023-06-22 ENCOUNTER — Other Ambulatory Visit: Payer: Self-pay

## 2023-06-28 ENCOUNTER — Encounter: Payer: Self-pay | Admitting: Family Medicine

## 2023-06-30 DIAGNOSIS — F5001 Anorexia nervosa, restricting type, mild: Secondary | ICD-10-CM | POA: Diagnosis not present

## 2023-06-30 DIAGNOSIS — F411 Generalized anxiety disorder: Secondary | ICD-10-CM | POA: Diagnosis not present

## 2023-07-10 ENCOUNTER — Other Ambulatory Visit (HOSPITAL_COMMUNITY): Payer: Self-pay

## 2023-07-10 ENCOUNTER — Other Ambulatory Visit (HOSPITAL_BASED_OUTPATIENT_CLINIC_OR_DEPARTMENT_OTHER): Payer: Self-pay

## 2023-07-10 ENCOUNTER — Other Ambulatory Visit: Payer: Self-pay | Admitting: Internal Medicine

## 2023-07-11 ENCOUNTER — Other Ambulatory Visit (HOSPITAL_BASED_OUTPATIENT_CLINIC_OR_DEPARTMENT_OTHER): Payer: Self-pay

## 2023-07-11 MED ORDER — INSULIN LISPRO 100 UNIT/ML IJ SOLN
75.0000 [IU] | Freq: Every day | INTRAMUSCULAR | 3 refills | Status: DC
  Filled 2023-07-11 – 2023-08-03 (×2): qty 30, 40d supply, fill #0
  Filled 2023-09-07: qty 30, 40d supply, fill #1
  Filled 2023-10-20: qty 30, 40d supply, fill #2
  Filled 2023-12-01 (×2): qty 30, 40d supply, fill #3

## 2023-07-14 DIAGNOSIS — F411 Generalized anxiety disorder: Secondary | ICD-10-CM | POA: Diagnosis not present

## 2023-07-14 DIAGNOSIS — F5001 Anorexia nervosa, restricting type, mild: Secondary | ICD-10-CM | POA: Diagnosis not present

## 2023-07-21 DIAGNOSIS — H5212 Myopia, left eye: Secondary | ICD-10-CM | POA: Diagnosis not present

## 2023-07-21 LAB — HM DIABETES EYE EXAM

## 2023-08-03 ENCOUNTER — Other Ambulatory Visit (HOSPITAL_BASED_OUTPATIENT_CLINIC_OR_DEPARTMENT_OTHER): Payer: Self-pay

## 2023-08-04 ENCOUNTER — Other Ambulatory Visit (HOSPITAL_BASED_OUTPATIENT_CLINIC_OR_DEPARTMENT_OTHER): Payer: Self-pay

## 2023-08-04 DIAGNOSIS — F411 Generalized anxiety disorder: Secondary | ICD-10-CM | POA: Diagnosis not present

## 2023-08-04 DIAGNOSIS — F5001 Anorexia nervosa, restricting type, mild: Secondary | ICD-10-CM | POA: Diagnosis not present

## 2023-08-11 ENCOUNTER — Encounter: Payer: Self-pay | Admitting: Family Medicine

## 2023-08-11 ENCOUNTER — Other Ambulatory Visit (HOSPITAL_COMMUNITY): Payer: Self-pay

## 2023-08-11 MED ORDER — HYDROXYZINE HCL 10 MG PO TABS
10.0000 mg | ORAL_TABLET | Freq: Three times a day (TID) | ORAL | 0 refills | Status: DC | PRN
  Filled 2023-08-11: qty 60, 10d supply, fill #0

## 2023-08-23 LAB — HEMOGLOBIN A1C: Hemoglobin A1C: 6.3

## 2023-08-25 DIAGNOSIS — F5001 Anorexia nervosa, restricting type, mild: Secondary | ICD-10-CM | POA: Diagnosis not present

## 2023-08-25 DIAGNOSIS — F411 Generalized anxiety disorder: Secondary | ICD-10-CM | POA: Diagnosis not present

## 2023-08-25 LAB — LAB REPORT - SCANNED
A1c: 6.3
Calcium: 9.5
EGFR: 89
TSH: 3.63 (ref 0.41–5.90)

## 2023-08-28 ENCOUNTER — Other Ambulatory Visit (HOSPITAL_COMMUNITY): Payer: Self-pay

## 2023-08-28 ENCOUNTER — Other Ambulatory Visit: Payer: Self-pay

## 2023-08-28 DIAGNOSIS — F32A Depression, unspecified: Secondary | ICD-10-CM

## 2023-08-28 MED ORDER — ESCITALOPRAM OXALATE 10 MG PO TABS
10.0000 mg | ORAL_TABLET | Freq: Every day | ORAL | 3 refills | Status: DC
  Filled 2023-08-28 (×2): qty 90, 90d supply, fill #0

## 2023-09-04 ENCOUNTER — Other Ambulatory Visit: Payer: Self-pay

## 2023-09-07 ENCOUNTER — Other Ambulatory Visit: Payer: Self-pay

## 2023-09-08 DIAGNOSIS — F411 Generalized anxiety disorder: Secondary | ICD-10-CM | POA: Diagnosis not present

## 2023-09-08 DIAGNOSIS — F5001 Anorexia nervosa, restricting type, mild: Secondary | ICD-10-CM | POA: Diagnosis not present

## 2023-09-25 ENCOUNTER — Encounter (INDEPENDENT_AMBULATORY_CARE_PROVIDER_SITE_OTHER): Payer: Self-pay | Admitting: Ophthalmology

## 2023-09-28 NOTE — Progress Notes (Signed)
 [facility] - Clinic Note  09/29/2023     CHIEF COMPLAINT Patient presents for Retina Follow Up   HISTORY OF PRESENT ILLNESS: Mark Barr is a 38 y.o. male who presents to the clinic today for:   HPI     Retina Follow Up   Patient presents with  Diabetic Retinopathy.  In both eyes.  This started 1.5.  Duration of 9 months.  Since onset it is stable.  I, the attending physician,  performed the HPI with the patient and updated documentation appropriately.        Comments   Pt presents for 9 month retina follow up, NPDR w/o DEM OU and PRP laser OS 02/21/22. Patient states vision has been the same, OS is a weaker than OD. Pt denies FOL/floaters/discomfort. Pt is not using ats.       Last edited by Ronelle Coffee, MD on 10/03/2023 11:40 PM.    Pt states he is not having any problems with his eyes   Referring physician: Dema Filler, MD [address] [place],  [place] [address]  HISTORICAL INFORMATION:   Selected notes from the MEDICAL RECORD NUMBER Referred by Dr. McCuen for concern of CRVO OS LEE:  Ocular Hx- PMH-    CURRENT MEDICATIONS: No current outpatient medications on file. (Ophthalmic Drugs)   No current facility-administered medications for this visit. (Ophthalmic Drugs)   Current Outpatient Medications (Other)  Medication Sig   Continuous Glucose Sensor (DEXCOM G7 SENSOR) MISC Change sensor every 10 days   escitalopram  (LEXAPRO ) 10 MG tablet Take 1 tablet (10 mg total) by mouth daily.   Glucagon  (GVOKE HYPOPEN  1-PACK) 1 MG/0.2ML SOAJ Use as directed for low blood sugar.   hydrOXYzine  (ATARAX ) 10 MG tablet Take 1-2 tablets (10-20 mg total) by mouth 3 (three) times daily as needed.   insulin  degludec (TRESIBA  FLEXTOUCH) 100 UNIT/ML SOPN FlexTouch Pen Inject 0.2-0.26 mLs (20-26 Units total) into the skin daily.   insulin  lispro (HUMALOG ) 100 UNIT/ML injection Inject up to 75 units into the pump daily as advised   Insulin  Pen Needle  (BD PEN NEEDLE NANO U/F) 32G X 4 MM MISC Use 4x a day   ondansetron  (ZOFRAN -ODT) 4 MG disintegrating tablet Take 1 tablet (4 mg total) by mouth every 8 (eight) hours as needed for nausea or vomiting.   pantoprazole  (PROTONIX ) 40 MG tablet Take 1 tablet (40 mg total) by mouth daily.   Dexlansoprazole  (DEXILANT ) 30 MG capsule DR Take 1 capsule (30 mg total) by mouth daily. (Patient not taking: Reported on 09/29/2023)   No current facility-administered medications for this visit. (Other)   REVIEW OF SYSTEMS: ROS   Positive for: Endocrine, Eyes Negative for: Constitutional, Gastrointestinal, Neurological, Skin, Genitourinary, Musculoskeletal, HENT, Cardiovascular, Respiratory, Psychiatric, Allergic/Imm, Heme/Lymph Last edited by Carrington Clack, COT on 09/29/2023 12:35 PM.     ALLERGIES Allergies  Allergen Reactions   Penicillins Rash   Penicillin G Nausea And Vomiting   PAST MEDICAL HISTORY Past Medical History:  Diagnosis Date   Anxiety    Chicken pox    Depression    Diabetes mellitus without complication (HCC)    Diabetic retinopathy (HCC)    Eating disorder    Past Surgical History:  Procedure Laterality Date   APPENDECTOMY     LASIK     2013 or 2014   WISDOM TOOTH EXTRACTION     FAMILY HISTORY Family History  Problem Relation Age of Onset   Thyroid  disease Mother  Hyperlipidemia Mother    Depression Mother    GER disease Mother    Hypertension Mother    Other Mother        prediabetes   Hyperlipidemia Father    Alcohol abuse Maternal Grandmother    Diabetes Maternal Grandmother    Cancer Maternal Grandfather        colon and prostate. age 56 in 2018   Stroke Maternal Grandfather        in 83s   Dementia Maternal Grandfather        vascular   Healthy Sister    SOCIAL HISTORY Social History   Tobacco Use   Smoking status: Never   Smokeless tobacco: Never  Vaping Use   Vaping status: Never Used  Substance Use Topics   Alcohol use: No   Drug use: No        OPHTHALMIC EXAM:  Base Eye Exam     Visual Acuity (Snellen - Linear)       Right Left   Dist [place] 20/20 20/20 -2   Dist ph [place]  20/20         Tonometry (Tonopen, 12:41 PM)       Right Left   Pressure 13 15         Pupils       Pupils Dark Light Shape React APD   Right PERRL 4 2 Round Brisk None   Left PERRL 4 2 Round Brisk None         Visual Fields       Left Right    Full Full         Extraocular Movement       Right Left    Full, Ortho Full, Ortho         Neuro/Psych     Oriented x3: Yes   Mood/Affect: Normal         Dilation     Both eyes: 1.0% Mydriacyl, 2.5% Phenylephrine @ 12:42 PM           Slit Lamp and Fundus Exam     Slit Lamp Exam       Right Left   Lids/Lashes Normal Normal   Conjunctiva/Sclera White and quiet White and quiet   Cornea well healed barely visible lasik flap well healed barely visible lasik flap   Anterior Chamber deep and clear deep and clear   Iris Round and dilated, No NVI Round and dilated, No NVI   Lens Clear Clear   Anterior Vitreous mild syneresis mild syneresis         Fundus Exam       Right Left   Disc Pink and Sharp, +PPP, no NVD Pink and Sharp, +PPP, no NVD   C/D Ratio 0.3 0.2   Macula Flat, Good foveal reflex, rare punctate MA -- improved Flat, Good foveal reflex, rare punctate MA   Vessels mild attenuation, mild tortuosity, no NV attenuated, Tortuous, +IRMA peripheral ST arcades, no NV   Periphery Attached, scattered MA -- rare, greatest temporal quad Attached, scattered MA / DBH -- small focal cluster ST quad improved; +IRMA; good segmental PRP and fill in temporal periphery           IMAGING AND PROCEDURES  Imaging and Procedures for 09/29/2023  OCT, Retina - OU - Both Eyes        Right Eye Quality was good. Central Foveal Thickness: 286. Progression has been stable. Findings include normal foveal contour, no IRF, no SRF, vitreomacular  adhesion .   Left Eye Quality  was good. Central Foveal Thickness: 297. Progression has been stable. Findings include normal foveal contour, no IRF, no SRF, vitreomacular adhesion .   Notes  *Images captured and stored on drive  Diagnosis / Impression:  NFP, no IRF/SRF OU No DME OU  Clinical management:  See below  Abbreviations: NFP - Normal foveal profile. CME - cystoid macular edema. PED - pigment epithelial detachment. IRF - intraretinal fluid. SRF - subretinal fluid. EZ - ellipsoid zone. ERM - epiretinal membrane. ORA - outer retinal atrophy. ORT - outer retinal tubulation. SRHM - subretinal hyper-reflective material. IRHM - intraretinal hyper-reflective material            ASSESSMENT/PLAN:    ICD-10-CM   1. 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 Encounter for long-term (current) use of insulin  (HCC)  Z79.4      1,2.  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, both eyes - BCVA 20/20 OU - OCT without diabetic macular edema, both eyes  - exam shows scattered MA OU (OS > OD); OS w/ focal cluster of DBH temporal periphery improving and +IRMA - FA (09.20.23) shows MA OU; OS with peripheral nonperfusion and corresponding leakage - s/p segmental PRP laser OS (10.09.23) - temporal periphery, fill in segmental PRP (04.17.24) - repeat FA (03.22.24) shows +MA, no NV OU; OS: +IRMA -- peripheral ST arcades; no frank NVE; vascular perfusion defects and +perivascular leakage temporal periphery; late leaking MA, staining of segmental PRP temporal periphery -- room for fill-in of vascular nonperfusion  - repeat FA (08.16.24) shows good fill in laser for vascular nonperfusion - discussed findings, prognosis and treatment options - no retinal or ophthalmic interventions indicated or recommended at this time - f/u in 6-9 months, DFE, OCT, FA (transit OS)   Ophthalmic Meds Ordered  this visit:  No orders of the defined types were placed in this encounter.    Return for f/u 6-9 months, NPDR OU, DFE, OCT, FA (transit OS).  There are no Patient Instructions on file for this visit.   This document serves as a record of services personally performed by Jeanice Millard, MD, PhD. It was created on their behalf by Eller Gut COT, an ophthalmic technician. The creation of this record is the provider's dictation and/or activities during the visit.    Electronically signed by: Eller Gut COT 05.15.2025  11:43 PM  This document serves as a record of services personally performed by Jeanice Millard, MD, PhD. It was created on their behalf by Morley Arabia. Bevin Bucks, OA an ophthalmic technician. The creation of this record is the provider's dictation and/or activities during the visit.    Electronically signed by: Morley Arabia. Bevin Bucks, OA 10/03/23 11:43 PM  Jeanice Millard, M.D., Ph.D. Diseases & Surgery of the Retina and Vitreous [facility] 09/29/2023   I have reviewed the above documentation for accuracy and completeness, and I agree with the above. Jeanice Millard, M.D., Ph.D. 10/03/23 11:50 PM   Abbreviations: M myopia (nearsighted); A astigmatism; H hyperopia (farsighted); P presbyopia; Mrx spectacle prescription;  CTL contact lenses; OD right eye; OS left eye; OU both eyes  XT exotropia; ET esotropia; PEK punctate epithelial keratitis; PEE punctate epithelial erosions; DES dry eye syndrome; MGD meibomian gland dysfunction; ATs artificial tears; PFAT's preservative free artificial tears; NSC nuclear sclerotic  cataract; PSC posterior subcapsular cataract; ERM epi-retinal membrane; PVD posterior vitreous detachment; RD retinal detachment; DM diabetes mellitus; DR diabetic retinopathy; NPDR non-proliferative diabetic retinopathy; PDR proliferative diabetic retinopathy; CSME clinically significant macular edema; DME diabetic macular edema; dbh dot blot  hemorrhages; CWS cotton wool spot; POAG primary open angle glaucoma; C/D cup-to-disc ratio; HVF humphrey visual field; GVF goldmann visual field; OCT optical coherence tomography; IOP intraocular pressure; BRVO Branch retinal vein occlusion; CRVO central retinal vein occlusion; CRAO central retinal artery occlusion; BRAO branch retinal artery occlusion; RT retinal tear; SB scleral buckle; PPV pars plana vitrectomy; VH Vitreous hemorrhage; PRP panretinal laser photocoagulation; IVK intravitreal kenalog ; VMT vitreomacular traction; MH Macular hole;  NVD neovascularization of the disc; NVE neovascularization elsewhere; AREDS age related eye disease study; ARMD age related macular degeneration; POAG primary open angle glaucoma; EBMD epithelial/anterior basement membrane dystrophy; ACIOL anterior chamber intraocular lens; IOL intraocular lens; PCIOL posterior chamber intraocular lens; Phaco/IOL phacoemulsification with intraocular lens placement; PRK photorefractive keratectomy; LASIK laser assisted in situ keratomileusis; HTN hypertension; DM diabetes mellitus; COPD chronic obstructive pulmonary disease

## 2023-09-29 ENCOUNTER — Ambulatory Visit (INDEPENDENT_AMBULATORY_CARE_PROVIDER_SITE_OTHER): Payer: 59 | Admitting: Ophthalmology

## 2023-09-29 ENCOUNTER — Encounter (INDEPENDENT_AMBULATORY_CARE_PROVIDER_SITE_OTHER): Payer: Self-pay | Admitting: Ophthalmology

## 2023-09-29 DIAGNOSIS — E103293 Type 1 diabetes mellitus with mild nonproliferative diabetic retinopathy without macular edema, bilateral: Secondary | ICD-10-CM

## 2023-09-29 DIAGNOSIS — Z794 Long term (current) use of insulin: Secondary | ICD-10-CM | POA: Diagnosis not present

## 2023-10-03 ENCOUNTER — Encounter (INDEPENDENT_AMBULATORY_CARE_PROVIDER_SITE_OTHER): Payer: Self-pay | Admitting: Ophthalmology

## 2023-10-03 LAB — HM DIABETES EYE EXAM

## 2023-10-06 ENCOUNTER — Other Ambulatory Visit (HOSPITAL_BASED_OUTPATIENT_CLINIC_OR_DEPARTMENT_OTHER): Payer: Self-pay

## 2023-10-06 DIAGNOSIS — F411 Generalized anxiety disorder: Secondary | ICD-10-CM | POA: Diagnosis not present

## 2023-10-06 DIAGNOSIS — F5001 Anorexia nervosa, restricting type, mild: Secondary | ICD-10-CM | POA: Diagnosis not present

## 2023-10-10 ENCOUNTER — Other Ambulatory Visit (HOSPITAL_BASED_OUTPATIENT_CLINIC_OR_DEPARTMENT_OTHER): Payer: Self-pay

## 2023-10-16 ENCOUNTER — Encounter: Payer: Self-pay | Admitting: Family Medicine

## 2023-10-16 DIAGNOSIS — F5001 Anorexia nervosa, restricting type, mild: Secondary | ICD-10-CM | POA: Diagnosis not present

## 2023-10-16 DIAGNOSIS — F411 Generalized anxiety disorder: Secondary | ICD-10-CM | POA: Diagnosis not present

## 2023-10-19 DIAGNOSIS — E109 Type 1 diabetes mellitus without complications: Secondary | ICD-10-CM | POA: Diagnosis not present

## 2023-10-20 ENCOUNTER — Other Ambulatory Visit: Payer: Self-pay | Admitting: Family Medicine

## 2023-10-23 ENCOUNTER — Other Ambulatory Visit (HOSPITAL_BASED_OUTPATIENT_CLINIC_OR_DEPARTMENT_OTHER): Payer: Self-pay

## 2023-10-23 MED ORDER — HYDROXYZINE HCL 10 MG PO TABS
10.0000 mg | ORAL_TABLET | Freq: Three times a day (TID) | ORAL | 0 refills | Status: AC | PRN
  Filled 2023-10-23: qty 60, 10d supply, fill #0

## 2023-10-27 ENCOUNTER — Encounter: Payer: Self-pay | Admitting: Internal Medicine

## 2023-10-27 ENCOUNTER — Ambulatory Visit: Payer: 59 | Admitting: Internal Medicine

## 2023-10-27 VITALS — BP 122/70 | HR 80

## 2023-10-27 DIAGNOSIS — E109 Type 1 diabetes mellitus without complications: Secondary | ICD-10-CM | POA: Diagnosis not present

## 2023-10-27 DIAGNOSIS — E063 Autoimmune thyroiditis: Secondary | ICD-10-CM

## 2023-10-27 NOTE — Patient Instructions (Addendum)
 Please continue: 12 am: 0.450, ISF 1:85, ICR 1:25 3 am : 0.700, ISF 1:75, ICR 1:14 6 am: 0.750, ISF 1:45, ICR 1:9 10 am: 0.700, ISF 1:45, ICR 1:9 5 pm: 0.850, ISF 1:40, ICR 1:9 9 pm: 0.700, ISF 1:75, ICR 1:16 - target CBG: 110 - Active insulin  time: 5h  Please return in 6 months.

## 2023-10-27 NOTE — Progress Notes (Signed)
 Patient ID: Mark Barr, male   DOB: 1985/11/07, 38 y.o.   MRN: [id number]  HPI: MAT STUARD is a 38 y.o.-year-old male, returning for f/u for DM1, dx 66 (at 38 y/o), controlled, w/o complications, on insulin  pump since 2000. Last visit 6 months ago.  Interim history: No increased urination, blurry vision, nausea, chest pain.  He continues to exercise consistently -CrossFit and previously running, but he hurt his hip recently and is now only walking. In the last month, he had more stress before leaving his position at work at the beginning of this week.  He will have 2 months off and then start working in the [employer] system.  Sugars have been higher but they improved significantly after stopping work.  He had to adjust his pump settings.  Insulin  pump: - Medtronic Revel 530 G loaner - Omnipod since12/09/2013 >> loved it, however he developed a blistering a rash and the attaching site (started to use Flonase before attaching the pump, which helped) - Medtronic 723  - Medtronic 670G since 10/2015 - T:slim X2 since 03/2017.  He is using the control IQ technology. - Omnipod 5 alternating with tandem t:slim pump  -currently on the tandem pump - tried the iLet pump - did not like it  - now Tandem Mobi through [place] - in 2024, he had to replace the pump recently due to malfunctioning  CGM: -Dexcom G6 since 01/2017 >> G7 -supplies from [facility]  Insulin : -He initially tried NovoLog  and Fiasp  in the pump, but did not see a difference between the 2 in the 670 G pump.   -After he switched to the T slim pump, he felt Fiasp  was working better for him -However, he is now using Humalog  per insurance preference -We tried Lyumjev  04/2020 but this caused burning at the infusion site so he stopped  Reviewed HbA1c levels: 10/23/2023: HbA1c 6.2% 08/23/2023: HbA1c 6.3% Lab Results  Component Value Date   HGBA1C 6.1 11/20/2022   HGBA1C 6.1 (A) 11/04/2022   HGBA1C 6.0 (A) 10/15/2021   HGBA1C  5.7 (A) 10/16/2020   HGBA1C 5.7 08/05/2019   HGBA1C 6.4 (A) 11/23/2018   HGBA1C 6.3 04/16/2018   HGBA1C 6.6 (H) 02/26/2016   HGBA1C 6.5 10/01/2015   HGBA1C 7.1 (H) 06/25/2015   HGBA1C 6.8 02/19/2015   HGBA1C 6.5 08/25/2014   HGBA1C 7.2 (H) 04/28/2014   HGBA1C 6.9 (H) 12/24/2013   HGBA1C 6.1 09/16/2013  04/16/2021: HbA1c 6.3% 04/10/2018: HbA1c 6.3% 09/15/2017: HbA1c 6.4%  Pump settings:   12 am: 0.450, ISF 1:85, ICR 1:25 3 am : 0.700, ISF 1:75, ICR 1:14 6 am: 0.750, ISF 1:45, ICR 1:9 10 am: 0.700, ISF 1:45, ICR 1:9 5 pm: 0.850, ISF 1:40, ICR 1:9 9 pm: 0.700, ISF 1:75, ICR 1:16 - target CBG: 110 - Active insulin  time: 5h  CGM downloads:  Previously:   Previously:   Total daily dose from basal: 219 units (45%) >> same >> 37% (15 units) >> 37% Total daily dose from bolus: 23 units (54%) >> same >> 62% (26 units) >> 63% Uses 39-60 units a day. - highest CBG:  300 >> 250s- pump site failure >> 200s in the last 2 weeks - lowest CBG: 45 >> 40s (increased activity) >> 47 in the last 2 weeks. 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   -No CKD -BUN/creatinine: 08/23/2023: 8/1.10, GFR 89, glucose 119 Lab Results  Component Value Date   BUN 8 12/29/2021   CREATININE 1.04 12/29/2021   -Normal ACR: Lab Results  Component Value Date   MICRALBCREAT 3 12/22/2022   MICRALBCREAT 1.2 06/18/2021   MICRALBCREAT 2.4 04/08/2020   MICRALBCREAT NOTE 11/23/2018   MICRALBCREAT 0.9 02/26/2016   MICRALBCREAT 2.4 02/23/2015   MICRALBCREAT 0.5 12/24/2013   MICRALBCREAT 0.3 02/11/2013   -No HL; last set of lipids:  08/23/2023: 169/57/63/95 09/05/2022:  159/60/65/82 Lab Results  Component Value Date   CHOL 177 06/18/2021   HDL 64.10 06/18/2021   LDLCALC 103 (H) 06/18/2021   TRIG 51.0 06/18/2021   CHOLHDL 3 06/18/2021   - last eye exam was on 09/29/2023 (Dr. Karyl Paget): + mild PDR OU.  He is getting laser treatments OS.  - no numbness and tingling in his feet.  Foot exam 04/15/2022 here in clinic and on 07/25/2022 by Dr. Luster Salters. Not seeing him anymore.   Hashimoto thyroiditis: -He did not require levothyroxine  Reviewed TFTs: 08/23/2023: TSH 3.63 Lab Results  Component Value Date   TSH 2.38 11/04/2022   TSH 4.30 11/12/2021   TSH 3.70 06/18/2021   TSH 2.67 04/08/2020   TSH 2.58 01/16/2019   TSH 4.84 (H) 11/23/2018   TSH 3.85 09/25/2017   TSH 1.61 09/27/2016   TSH 2.24 02/26/2016   TSH 2.27 06/25/2015   TSH 4.50 02/23/2015   TSH 1.34 12/24/2013   TSH 2.35 02/11/2013   TSH 1.98 10/10/2012  09/05/2022: TSH 5.45 (<4.5)  Lab Results  Component Value Date   FREET4 0.91 11/04/2022   FREET4 0.76 11/12/2021   FREET4 0.71 06/18/2021   FREET4 0.74 04/08/2020   FREET4 1.0 01/16/2019   FREET4 0.9 11/23/2018   FREET4 0.9 09/25/2017   FREET4 0.78 09/27/2016   FREET4 0.75 02/26/2016   FREET4 0.86 06/25/2015   Lab Results  Component Value Date   T3FREE 2.4 11/04/2022   T3FREE 2.8 06/18/2021   T3FREE 2.4 04/08/2020   T3FREE 2.8 01/16/2019   T3FREE 3.0 11/23/2018   T3FREE 2.8 09/25/2017   T3FREE 3.3 02/26/2016   T3FREE 3.5 06/25/2015   T3FREE 2.7 02/11/2013   His antithyroid antibodies were elevated: Component     Latest Ref Rng & Units 06/25/2015  Thyroperoxidase Ab SerPl-aCnc     <9 IU/mL 75 (H)  Thyroglobulin Ab     <2 IU/mL 4 (H)   He had hip surgery 04/2019.  He has 2 daughters.  ROS: + see HPI  I reviewed pt's medications, allergies, PMH, social hx, family hx, and changes were documented in the history of present illness. Otherwise, unchanged from my initial visit note.  Past Medical History:  Diagnosis Date    Anxiety    Chicken pox    Depression    Diabetes mellitus without complication (HCC)    Diabetic retinopathy (HCC)    Eating disorder    Past Surgical History:  Procedure Laterality Date   APPENDECTOMY     LASIK     2013 or 2014   WISDOM TOOTH EXTRACTION     Social History   Socioeconomic History   Marital status: Married    Spouse name: Not on file   Number of children: Not on file   Years of education: Not on file  Highest education level: Not on file  Occupational History   Occupation: Publishing rights manager    Employer: [employer]  Tobacco Use   Smoking status: Never   Smokeless tobacco: Never  Vaping Use   Vaping status: Never Used  Substance and Sexual Activity   Alcohol use: No   Drug use: No   Sexual activity: Yes  Other Topics Concern   Not on file  Social History Narrative   Married. Daughter 81 years old Macky Sayres and Carlus Chihuahua 6 almost 38 year old in 2022. One child passed early -after birth.       Finished doctorate may 2024- out of [employer] [employer] [facility]   FNP endocrinology   App undergrad- graduated [employer]. Masters at [employer].       Regular exercise: yes, biking, run, weight lifting- back to crossfit, mountain biking- slow on this compared to previous with neck      Caffeine use: daily   Social Drivers of Corporate investment banker Strain: Not on file  Food Insecurity: Not on file  Transportation Needs: Not on file  Physical Activity: Not on file  Stress: Not on file  Social Connections: Not on file  Intimate Partner Violence: Not on file   Current Outpatient Medications on File Prior to Visit  Medication Sig Dispense Refill   Continuous Glucose Sensor (DEXCOM G7 SENSOR) MISC Change sensor every 10 days 9 each 3   Dexlansoprazole  (DEXILANT ) 30 MG capsule DR Take 1 capsule (30 mg total) by mouth daily. (Patient not taking: Reported on 09/29/2023) 30 capsule 11   escitalopram  (LEXAPRO ) 10 MG tablet Take 1 tablet (10 mg total) by mouth daily. 90 tablet 3   Glucagon  (GVOKE HYPOPEN   1-PACK) 1 MG/0.2ML SOAJ Use as directed for low blood sugar. 0.4 mL PRN   hydrOXYzine  (ATARAX ) 10 MG tablet Take 1-2 tablets (10-20 mg total) by mouth 3 (three) times daily as needed. 60 tablet 0   insulin  degludec (TRESIBA  FLEXTOUCH) 100 UNIT/ML SOPN FlexTouch Pen Inject 0.2-0.26 mLs (20-26 Units total) into the skin daily. 5 pen 5   insulin  lispro (HUMALOG ) 100 UNIT/ML injection Inject up to 75 units into the pump daily as advised 30 mL 3   Insulin  Pen Needle (BD PEN NEEDLE NANO U/F) 32G X 4 MM MISC Use 4x a day 200 each 11   ondansetron  (ZOFRAN -ODT) 4 MG disintegrating tablet Take 1 tablet (4 mg total) by mouth every 8 (eight) hours as needed for nausea or vomiting. 20 tablet 1   pantoprazole  (PROTONIX ) 40 MG tablet Take 1 tablet (40 mg total) by mouth daily. 90 tablet 3   No current facility-administered medications on file prior to visit.   Allergies  Allergen Reactions   Penicillins Rash   Penicillin G Nausea And Vomiting   Family History  Problem Relation Age of Onset   Thyroid  disease Mother    Hyperlipidemia Mother    Depression Mother    GER disease Mother    Hypertension Mother    Other Mother        prediabetes   Hyperlipidemia Father    Alcohol abuse Maternal Grandmother    Diabetes Maternal Grandmother    Cancer Maternal Grandfather        colon and prostate. age 32 in 2018   Stroke Maternal Grandfather        in 64s   Dementia Maternal Grandfather        vascular   Healthy Sister    PE: BP 122/70   Pulse 80  SpO2 97% he declined being weighed today. Wt Readings from Last 3 Encounters:  05/05/23 164 lb (74.4 kg)  12/22/22 167 lb 12.8 oz (76.1 kg)  11/04/22 168 lb (76.2 kg)   Constitutional: normal weight, fit, in NAD Eyes:  EOMI, no exophthalmos ENT: no neck masses, no cervical lymphadenopathy Cardiovascular: RRR, No MRG Respiratory: CTA B Musculoskeletal: no deformities Skin:no rashes Neurological: + Mild tremor with outstretched hands Diabetic Foot  Exam - Simple   Simple Foot Form Diabetic Foot exam was performed with the following findings: Yes 10/27/2023  8:25 AM  Visual Inspection No deformities, no ulcerations, no other skin breakdown bilaterally: Yes Sensation Testing Intact to touch and monofilament testing bilaterally: Yes Pulse Check Posterior Tibialis and Dorsalis pulse intact bilaterally: Yes Comments    ASSESSMENT: 1. DM1, controlled, with complications:  - Mild NPDR OU  2. Hashimoto thyroiditis - Euthyroid  PLAN:  1. Patient with well-controlled type 1 diabetes, with a history of hypoglycemic episodes, now improved.  He is usually alternating between different insulin  pumps: OmniPod, t:slim, Mobi.  He also tried the iLet pump, but he did not feel this was a good fit for him due to the persistently high blood sugars.  He likes the mobi insulin  pump best. -At last visit, sugars were well-controlled throughout the day and night but they were dropping occasionally after dinner and, upon reviewing individual pump records, this could have been due to to bolusing Humalog  too late, the sugars were already high after the meal.  We discussed about trying his best to bolus 15 minutes before the meal but did not make any other changes in his pump settings.  He previously had a higher basal rate at night when she relaxed before last visit.  Otherwise, sugars appeared improved on the mobi insulin  pump.  HbA1c was 6.3%, excellent.  Latest HbA1c is from 5 days ago and this was slightly lower, at 6.2%. - Of note, Baqsimi  was not covered for him in the past. CGM interpretation: -At today's visit, we reviewed his CGM downloads: It appears that 79% of values are in target range (goal >70%), while 19% are higher than 180 (goal <25%), and 2% are lower than 70 (goal <4%).  The calculated average blood sugar is 143.  The projected HbA1c for the next 3 months (GMI) is 6.7%. -Reviewing the CGM trends, sugars appear to be fluctuating slightly higher  than before, with an increase in blood sugars after breakfast due to significant stress at work right before he stopped working at the current job, 5 days ago.  Afterwards, sugars improved.  In fact, they started to drop so he had to adjust his pump settings (basal rates) in the last few days.  As of now, with improvement in stress, I would expect the blood sugars to remain controlled on the current regimen so I did not suggest changes in his pump settings. -He will let me know if we need to send the supplies to a different pharmacy after starting his new job -I advised him to: Patient Instructions  Please continue: 12 am: 0.450, ISF 1:85, ICR 1:25 3 am : 0.700, ISF 1:75, ICR 1:14 6 am: 0.750, ISF 1:45, ICR 1:9 10 am: 0.700, ISF 1:45, ICR 1:9 5 pm: 0.850, ISF 1:40, ICR 1:9 9 pm: 0.700, ISF 1:75, ICR 1:16 - target CBG: 110 - Active insulin  time: 5h  Please return in 6 months.  - advised to check sugars at different times of the day - 4x a day,  rotating check times - advised for yearly eye exams >> he is UTD - he is due for urine ACR-will get this checked in 12/2023 at next visit with PCP, per his request - return to clinic in 6 months  2. Hashimoto's thyroiditis No signs or symptoms of hypothyroidism except for cold intolerance, which is longstanding for him -TSH obtained in early 2024 was slightly above the upper limit of the target range, at 5.45.  We repeated his TFTs afterwards and they were all normal - He had another TSH obtained through labs at work, which was normal, 2 months ago  Emilie Harden, MD PhD [facility]

## 2023-10-30 ENCOUNTER — Other Ambulatory Visit (HOSPITAL_BASED_OUTPATIENT_CLINIC_OR_DEPARTMENT_OTHER): Payer: Self-pay

## 2023-10-30 DIAGNOSIS — F5001 Anorexia nervosa, restricting type, mild: Secondary | ICD-10-CM | POA: Diagnosis not present

## 2023-10-30 DIAGNOSIS — F411 Generalized anxiety disorder: Secondary | ICD-10-CM | POA: Diagnosis not present

## 2023-11-03 ENCOUNTER — Encounter: Payer: Self-pay | Admitting: Internal Medicine

## 2023-11-06 ENCOUNTER — Other Ambulatory Visit (HOSPITAL_COMMUNITY): Payer: Self-pay

## 2023-11-06 MED ORDER — OMNIPOD 5 G7 PODS (GEN 5) MISC
1.0000 | 3 refills | Status: DC
  Filled 2023-11-06 – 2023-11-10 (×2): qty 10, 30d supply, fill #0

## 2023-11-08 ENCOUNTER — Other Ambulatory Visit (HOSPITAL_BASED_OUTPATIENT_CLINIC_OR_DEPARTMENT_OTHER): Payer: Self-pay

## 2023-11-10 ENCOUNTER — Other Ambulatory Visit (HOSPITAL_COMMUNITY): Payer: Self-pay

## 2023-11-13 ENCOUNTER — Other Ambulatory Visit (HOSPITAL_COMMUNITY): Payer: Self-pay

## 2023-11-13 ENCOUNTER — Other Ambulatory Visit: Payer: Self-pay

## 2023-11-13 DIAGNOSIS — F419 Anxiety disorder, unspecified: Secondary | ICD-10-CM

## 2023-11-13 MED ORDER — ESCITALOPRAM OXALATE 10 MG PO TABS
15.0000 mg | ORAL_TABLET | Freq: Every day | ORAL | 3 refills | Status: AC
  Filled 2023-11-13 – 2023-11-16 (×2): qty 90, 60d supply, fill #0
  Filled 2024-01-05: qty 90, 60d supply, fill #1

## 2023-11-16 ENCOUNTER — Other Ambulatory Visit (HOSPITAL_BASED_OUTPATIENT_CLINIC_OR_DEPARTMENT_OTHER): Payer: Self-pay

## 2023-11-16 ENCOUNTER — Other Ambulatory Visit (HOSPITAL_COMMUNITY): Payer: Self-pay

## 2023-11-24 ENCOUNTER — Other Ambulatory Visit: Payer: Self-pay

## 2023-11-27 ENCOUNTER — Other Ambulatory Visit (HOSPITAL_BASED_OUTPATIENT_CLINIC_OR_DEPARTMENT_OTHER): Payer: Self-pay

## 2023-12-01 ENCOUNTER — Encounter: Payer: Self-pay | Admitting: Internal Medicine

## 2023-12-01 ENCOUNTER — Other Ambulatory Visit (HOSPITAL_BASED_OUTPATIENT_CLINIC_OR_DEPARTMENT_OTHER): Payer: Self-pay

## 2023-12-01 ENCOUNTER — Telehealth: Payer: Self-pay

## 2023-12-01 NOTE — Telephone Encounter (Signed)
 Pharmacy Patient Advocate Encounter   Received notification from Pt Calls Messages that prior authorization for Dexcom G7 sensor is required/requested.   Insurance verification completed.   The patient is insured through [facility] .   Per test claim: PA required; PA submitted to above mentioned insurance via CoverMyMeds Key/confirmation #/EOC [facility] Status is pending

## 2023-12-01 NOTE — Telephone Encounter (Signed)
 Pt needs PA for Dexcom G7

## 2023-12-02 ENCOUNTER — Other Ambulatory Visit (HOSPITAL_BASED_OUTPATIENT_CLINIC_OR_DEPARTMENT_OTHER): Payer: Self-pay

## 2023-12-05 NOTE — Telephone Encounter (Signed)
 Pharmacy Patient Advocate Encounter  Received notification from [facility] that Prior Authorization for Dexcom G7 sensor has been APPROVED from 12/01/23 to 11/30/24   PA #/Case ID/Reference #: [id number]

## 2023-12-19 DIAGNOSIS — F411 Generalized anxiety disorder: Secondary | ICD-10-CM | POA: Diagnosis not present

## 2023-12-19 DIAGNOSIS — F5001 Anorexia nervosa, restricting type, mild: Secondary | ICD-10-CM | POA: Diagnosis not present

## 2023-12-29 ENCOUNTER — Encounter: Payer: 59 | Admitting: Family Medicine

## 2024-01-05 ENCOUNTER — Other Ambulatory Visit: Payer: Self-pay

## 2024-01-05 ENCOUNTER — Other Ambulatory Visit (HOSPITAL_BASED_OUTPATIENT_CLINIC_OR_DEPARTMENT_OTHER): Payer: Self-pay

## 2024-01-05 ENCOUNTER — Other Ambulatory Visit: Payer: Self-pay | Admitting: Internal Medicine

## 2024-01-05 MED ORDER — INSULIN LISPRO 100 UNIT/ML IJ SOLN
75.0000 [IU] | Freq: Every day | INTRAMUSCULAR | 3 refills | Status: DC
  Filled 2024-01-05: qty 30, 40d supply, fill #0

## 2024-01-09 ENCOUNTER — Other Ambulatory Visit (HOSPITAL_COMMUNITY): Payer: Self-pay

## 2024-01-09 ENCOUNTER — Encounter: Payer: Self-pay | Admitting: Internal Medicine

## 2024-01-09 DIAGNOSIS — E109 Type 1 diabetes mellitus without complications: Secondary | ICD-10-CM

## 2024-01-09 MED ORDER — OMNIPOD 5 G7 PODS (GEN 5) MISC
1.0000 | 3 refills | Status: DC
  Filled 2024-01-09: qty 15, 30d supply, fill #0

## 2024-01-18 ENCOUNTER — Encounter: Payer: Self-pay | Admitting: Family Medicine

## 2024-01-18 ENCOUNTER — Ambulatory Visit (INDEPENDENT_AMBULATORY_CARE_PROVIDER_SITE_OTHER): Payer: PRIVATE HEALTH INSURANCE | Admitting: Family Medicine

## 2024-01-18 ENCOUNTER — Other Ambulatory Visit (HOSPITAL_COMMUNITY): Payer: Self-pay

## 2024-01-18 VITALS — BP 92/68 | HR 78 | Temp 98.2°F | Ht 69.0 in

## 2024-01-18 DIAGNOSIS — E063 Autoimmune thyroiditis: Secondary | ICD-10-CM

## 2024-01-18 DIAGNOSIS — E109 Type 1 diabetes mellitus without complications: Secondary | ICD-10-CM | POA: Diagnosis not present

## 2024-01-18 DIAGNOSIS — F32A Depression, unspecified: Secondary | ICD-10-CM

## 2024-01-18 DIAGNOSIS — F419 Anxiety disorder, unspecified: Secondary | ICD-10-CM

## 2024-01-18 DIAGNOSIS — K219 Gastro-esophageal reflux disease without esophagitis: Secondary | ICD-10-CM

## 2024-01-18 DIAGNOSIS — Z0000 Encounter for general adult medical examination without abnormal findings: Secondary | ICD-10-CM

## 2024-01-18 MED ORDER — ONDANSETRON 4 MG PO TBDP
4.0000 mg | ORAL_TABLET | Freq: Three times a day (TID) | ORAL | 1 refills | Status: AC | PRN
Start: 2024-01-18 — End: ?
  Filled 2024-01-18: qty 20, 7d supply, fill #0

## 2024-01-18 MED ORDER — PANTOPRAZOLE SODIUM 40 MG PO TBEC
40.0000 mg | DELAYED_RELEASE_TABLET | Freq: Every day | ORAL | 3 refills | Status: AC
  Filled 2024-01-18: qty 90, 90d supply, fill #0

## 2024-01-18 NOTE — Progress Notes (Signed)
 Phone: [phone]    Subjective:  Patient presents today for their annual physical. Chief complaint-noted.   See problem oriented charting- ROS- full  review of systems was completed and negative  except for topics noted under acute/chronic concerns  The following were reviewed and entered/updated in epic: Past Medical History:  Diagnosis Date   Anxiety    Chicken pox    Depression    Diabetes mellitus without complication (HCC)    Diabetic retinopathy (HCC)    Eating disorder    Patient Active Problem List   Diagnosis Date Noted   Diabetes type 1, controlled (HCC) 01/07/2013    Priority: High   Hashimoto's thyroiditis 06/26/2015    Priority: Medium    Anxiety and depression 01/07/2013    Priority: Medium    GERD (gastroesophageal reflux disease) 01/07/2013    Priority: Low   History of anorexia nervosa 01/07/2013    Priority: Low   Other specified joint disorders, left hip 03/22/2019    Priority: 1.   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   Chronic insomnia 09/01/2020   Past Surgical History:  Procedure Laterality Date   APPENDECTOMY     LASIK     2013 or 2014   WISDOM TOOTH EXTRACTION      Family History  Problem Relation Age of Onset   Thyroid  disease Mother    Hyperlipidemia Mother    Depression Mother    GER disease Mother    Hypertension Mother    Other Mother        prediabetes   Hyperlipidemia Father    Alcohol abuse Maternal Grandmother    Diabetes Maternal Grandmother    Cancer Maternal Grandfather        colon and prostate. age 21 in 2018   Stroke Maternal Grandfather        in 38s   Dementia Maternal Grandfather        vascular   Healthy Sister     Medications- reviewed and updated Current Outpatient Medications  Medication Sig Dispense Refill   Continuous Glucose Sensor (DEXCOM G7 SENSOR) MISC Change sensor every 10 days 9 each 3   escitalopram  (LEXAPRO ) 10 MG tablet Take 1.5 tablets (15 mg  total) by mouth daily. 90 tablet 3   Glucagon  (GVOKE HYPOPEN  1-PACK) 1 MG/0.2ML SOAJ Use as directed for low blood sugar. 0.4 mL PRN   hydrOXYzine  (ATARAX ) 10 MG tablet Take 1-2 tablets (10-20 mg total) by mouth 3 (three) times daily as needed. 60 tablet 0   insulin  degludec (TRESIBA  FLEXTOUCH) 100 UNIT/ML SOPN FlexTouch Pen Inject 0.2-0.26 mLs (20-26 Units total) into the skin daily. 5 pen 5   Insulin  Disposable Pump (OMNIPOD 5 G7 PODS, GEN 5,) MISC Apply 1 pod every other day. 45 each 3   insulin  lispro (HUMALOG ) 100 UNIT/ML injection Inject up to 75 units into the pump daily as advised 30 mL 3   Insulin  Pen Needle (BD PEN NEEDLE NANO U/F) 32G X 4 MM MISC Use 4x a day 200 each 11   ondansetron  (ZOFRAN -ODT) 4 MG disintegrating tablet Take 1 tablet (4 mg total) by mouth every 8 (eight) hours as needed for nausea or vomiting. 20 tablet 1   pantoprazole  (PROTONIX ) 40 MG tablet Take 1 tablet (40 mg total) by mouth daily. 90 tablet 3   No current facility-administered medications for this visit.    Allergies-reviewed and updated Allergies  Allergen Reactions   Penicillins Rash   Penicillin G  Nausea And Vomiting    Social History   Social History Narrative   Married. Daughter 34 years old Dorian and Bard 22 almost 38 year old in 2022. One child passed early -after birth.       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 Masters at [employer].       Regular exercise: yes, biking, run, weight lifting- back to crossfit, mountain biking- slow on this compared to previous with neck      Caffeine use: daily      Objective:  BP 92/68 (BP Location: Left Arm, Patient Position: Sitting, Cuff Size: Normal)   Pulse 78   Temp 98.2 F (36.8 C) (Temporal)   Ht 5' 9 (1.753 m)   SpO2 97%   BMI 24.22 kg/m  Gen: NAD, resting comfortably HEENT: Mucous membranes  are moist. Oropharynx normal Neck: no thyromegaly CV: RRR no murmurs rubs or gallops Lungs: CTAB no crackles, wheeze, rhonchi Abdomen: soft/nontender/nondistended/normal bowel sounds. No rebound or guarding.  Ext: no edema Skin: warm, dry Neuro: grossly normal, moves all extremities, PERRLA     Assessment and Plan:  38 y.o. male presenting for annual physical.  Health Maintenance counseling: 1. Anticipatory guidance: Patient counseled regarding regular dental exams -q6 months, eye exams - we have copy of this,  avoiding smoking and second hand smoke , limiting alcohol to 2 beverages per day- minimal social intake, no illicit drugs.   2. Risk factor reduction:  Advised patient of need for regular exercise and diet rich and fruits and vegetables to reduce risk of heart attack and stroke.  Exercise- crossfit first thing each morning with home set up- still running. Hard to find time for the long rides he enjoys.  Diet/weight management- opts out of weight today but reports lost some weight with stress of job transition  Wt Readings from Last 3 Encounters:  05/05/23 164 lb (74.4 kg)  12/22/22 167 lb 12.8 oz (76.1 kg)  11/04/22 168 lb (76.2 kg)   3. Immunizations/screenings/ancillary studies- Prevnar 20 opts out, flu shot with work. HBV with childhood vaccines Immunization History  Administered Date(s) Administered   Influenza,inj,Quad PF,6+ Mos 02/11/2022   Influenza-Unspecified 02/14/2012, 02/12/2013, 02/13/2017   PFIZER(Purple Top)SARS-COV-2 Vaccination 03/25/2020   Pneumococcal Polysaccharide-23 05/24/2012   Tdap 03/26/2008, 11/29/2018  4. Prostate cancer screening- had doctors day labs and less than 0.1 last 2 years reports. Grandfather in 28's with prostate cancer  5. Colon cancer screening - no family history, start at age 83. Grandfather in 56's with small bowel issues- required surgery 6. Skin cancer screening/prevention- no recent dermatology visits- will go as needed- but plans  to set up when insurance stabilizes. advised regular sunscreen use. Denies worrisome, changing, or new skin lesions.  7. Testicular cancer screening- advised monthly self exams  8. STD screening- patient opts out- only active with wife 9. 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 Trixie as well -Attempt a get urine microalbumin today if lab still open  # Anxiety/stress-very stressful situation earlier in the year with job transition but is settling in nicely at [employer].  He is taking 10 to 15 mg of Lexapro  and finds this  helpful.  15 mg when stressors are higher and these dosages are so close I think it is reasonable - Has not had to use hydroxyzine  since mid June -Continues to work with counselor Geofm regularly  # GERD-pantoprazole  has upset stomach in the past but he is tolerating currently thankfully since Dexilant  was not approved  # In case of GI illness-has Zofran  on hand  # Hashimoto's thyroiditis-thankfully TSH has remained controlled Lab Results  Component Value Date   TSH 3.63 08/25/2023   Recommended follow up: Return in about 1 year (around 01/17/2025) for physical or sooner if needed.Schedule b4 you leave. Future Appointments  Date Time Provider Department Center  04/05/2024 12:45 PM Valdemar Rogue, MD [facility] None  04/29/2024  8:00 AM Trixie File, MD [facility] None  01/23/2025  2:00 PM Katrinka Garnette KIDD, MD [facility]    Lab/Order associations: The doctors did labs earlier this year-labs not needed   ICD-10-CM   1. Preventative health care  Z00.00     2. Controlled diabetes mellitus type 1 without complications (HCC)  E10.9 Urine Albumin/Creatinine with ratio (send out) [LAB689]    CANCELED: Urine Albumin/Creatinine with ratio (send out) [LAB689]    3. Hashimoto's thyroiditis  E06.3      4. Anxiety and depression  F41.9    F32.A     5. Gastroesophageal reflux disease, unspecified whether esophagitis present  K21.9       Meds ordered this encounter  Medications   ondansetron  (ZOFRAN -ODT) 4 MG disintegrating tablet    Sig: Take 1 tablet (4 mg total) by mouth every 8 (eight) hours as needed for nausea or vomiting.    Dispense:  20 tablet    Refill:  1   pantoprazole  (PROTONIX ) 40 MG tablet    Sig: Take 1 tablet (40 mg total) by mouth daily.    Dispense:  90 tablet    Refill:  3    Return precautions advised.   Garnette Katrinka, MD

## 2024-01-18 NOTE — Patient Instructions (Addendum)
 Please stop by lab before you go If you have mychart- we will send your results within 3 business days of us  receiving them.  If you do not have mychart- we will call you about results within 5 business days of us  receiving them.  *please also note that you will see labs on mychart as soon as they post. I will later go in and write notes on them- will say notes from Dr. Katrinka   Recommended follow up: Return in about 1 year (around 01/17/2025) for physical or sooner if needed.Schedule b4 you leave.

## 2024-01-19 ENCOUNTER — Other Ambulatory Visit (HOSPITAL_COMMUNITY): Payer: Self-pay

## 2024-01-29 ENCOUNTER — Other Ambulatory Visit (HOSPITAL_COMMUNITY): Payer: Self-pay

## 2024-02-08 ENCOUNTER — Other Ambulatory Visit: Payer: Self-pay

## 2024-02-08 MED ORDER — DEXCOM G7 SENSOR MISC: 1.0000 | 3 refills | Status: AC

## 2024-02-08 MED ORDER — INSULIN LISPRO 100 UNIT/ML IJ SOLN: 75.0000 [IU] | Freq: Every day | INTRAMUSCULAR | 0 refills | Status: DC

## 2024-02-08 MED ORDER — OMNIPOD 5 G7 PODS (GEN 5) MISC: 1.0000 | 3 refills | Status: AC

## 2024-02-08 NOTE — Addendum Note (Signed)
 Addended by: ARLOA JEOFFREY SAILOR on: 02/08/2024 08:26 AM   Modules accepted: Orders

## 2024-02-12 ENCOUNTER — Telehealth: Payer: Self-pay

## 2024-02-12 ENCOUNTER — Other Ambulatory Visit (HOSPITAL_COMMUNITY): Payer: Self-pay

## 2024-02-12 NOTE — Telephone Encounter (Signed)
 Pharmacy Patient Advocate Encounter   Received notification from CoverMyMeds that prior authorization for Dexcom G7 sensor is required/requested.   Insurance verification completed.   The patient is insured through [place] .   Per test claim: PA required and submitted KEY/EOC/Request #: [id number] due to Refill too soon. Next refill is available on or after 04/16/24. PA not required.

## 2024-03-22 ENCOUNTER — Other Ambulatory Visit (HOSPITAL_COMMUNITY): Payer: Self-pay

## 2024-04-02 NOTE — Progress Notes (Shared)
 [facility] - Clinic Note  04/05/2024     CHIEF COMPLAINT Patient presents for No chief complaint on file.   HISTORY OF PRESENT ILLNESS: Mark Barr is a 38 y.o. male who presents to the clinic today for:    Pt states he is not having any problems with his eyes   Referring physician: Leslee Reusing, MD [address] [place],  [place] [address]  HISTORICAL INFORMATION:   Selected notes from the MEDICAL RECORD NUMBER Referred by Dr. McCuen for concern of CRVO OS LEE:  Ocular Hx- PMH-    CURRENT MEDICATIONS: No current outpatient medications on file. (Ophthalmic Drugs)   No current facility-administered medications for this visit. (Ophthalmic Drugs)   Current Outpatient Medications (Other)  Medication Sig   Continuous Glucose Sensor (DEXCOM G7 SENSOR) MISC Change sensor every 10 days   escitalopram  (LEXAPRO ) 10 MG tablet Take 1.5 tablets (15 mg total) by mouth daily.   Glucagon  (GVOKE HYPOPEN  1-PACK) 1 MG/0.2ML SOAJ Use as directed for low blood sugar.   hydrOXYzine  (ATARAX ) 10 MG tablet Take 1-2 tablets (10-20 mg total) by mouth 3 (three) times daily as needed.   insulin  degludec (TRESIBA  FLEXTOUCH) 100 UNIT/ML SOPN FlexTouch Pen Inject 0.2-0.26 mLs (20-26 Units total) into the skin daily.   Insulin  Disposable Pump (OMNIPOD 5 G7 PODS, GEN 5,) MISC Apply 1 pod every other day.   insulin  lispro (HUMALOG ) 100 UNIT/ML injection Inject up to 75 units into the pump daily as advised   Insulin  Pen Needle (BD PEN NEEDLE NANO U/F) 32G X 4 MM MISC Use 4x a day   ondansetron  (ZOFRAN -ODT) 4 MG disintegrating tablet Take 1 tablet (4 mg total) by mouth every 8 (eight) hours as needed for nausea or vomiting.   pantoprazole  (PROTONIX ) 40 MG tablet Take 1 tablet (40 mg total) by mouth daily.   No current facility-administered medications for this visit. (Other)   REVIEW OF SYSTEMS:   ALLERGIES Allergies  Allergen Reactions   Penicillins Rash    Penicillin G Nausea And Vomiting   PAST MEDICAL HISTORY Past Medical History:  Diagnosis Date   Anxiety    Chicken pox    Depression    Diabetes mellitus without complication (HCC)    Diabetic retinopathy (HCC)    Eating disorder    Past Surgical History:  Procedure Laterality Date   APPENDECTOMY     LASIK     2013 or 2014   WISDOM TOOTH EXTRACTION     FAMILY HISTORY Family History  Problem Relation Age of Onset   Thyroid  disease Mother    Hyperlipidemia Mother    Depression Mother    GER disease Mother    Hypertension Mother    Other Mother        prediabetes   Hyperlipidemia Father    Alcohol abuse Maternal Grandmother    Diabetes Maternal Grandmother    Cancer Maternal Grandfather        colon and prostate. age 47 in 2018   Stroke Maternal Grandfather        in 49s   Dementia Maternal Grandfather        vascular   Healthy Sister    SOCIAL HISTORY Social History   Tobacco Use   Smoking status: Never   Smokeless tobacco: Never  Vaping Use   Vaping status: Never Used  Substance Use Topics   Alcohol use: No   Drug use: No       OPHTHALMIC EXAM:  Not recorded    IMAGING AND PROCEDURES  Imaging and Procedures for 04/05/2024          ASSESSMENT/PLAN:  No diagnosis found.  1,2.  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, both eyes - BCVA 20/20 OU - OCT without diabetic macular edema, both eyes  - exam shows scattered MA OU (OS > OD); OS w/ focal cluster of DBH temporal periphery improving and +IRMA - FA (09.20.23) shows MA OU; OS with peripheral nonperfusion and corresponding leakage - s/p segmental PRP laser OS (10.09.23) - temporal periphery, fill in segmental PRP (04.17.24) - repeat FA (03.22.24) shows +MA, no NV OU; OS: +IRMA -- peripheral ST arcades; no frank NVE; vascular perfusion defects and +perivascular leakage temporal periphery; late leaking MA, staining of segmental PRP temporal periphery -- room for fill-in of vascular  nonperfusion  - repeat FA (08.16.24) shows good fill in laser for vascular nonperfusion - discussed findings, prognosis and treatment options - no retinal or ophthalmic interventions indicated or recommended at this time - f/u in 6-9 months, DFE, OCT, FA (transit OS)   Ophthalmic Meds Ordered this visit:  No orders of the defined types were placed in this encounter.    No follow-ups on file.  There are no Patient Instructions on file for this visit.   This document serves as a record of services personally performed by Redell JUDITHANN Hans, MD, PhD. It was created on their behalf by Delon Newness COT, an ophthalmic technician. The creation of this record is the provider's dictation and/or activities during the visit.    Electronically signed by: Delon Newness COT 11.18.25 10:26 AM    Abbreviations: M myopia (nearsighted); A astigmatism; H hyperopia (farsighted); P presbyopia; Mrx spectacle prescription;  CTL contact lenses; OD right eye; OS left eye; OU both eyes  XT exotropia; ET esotropia; PEK punctate epithelial keratitis; PEE punctate epithelial erosions; DES dry eye syndrome; MGD meibomian gland dysfunction; ATs artificial tears; PFAT's preservative free artificial tears; NSC nuclear sclerotic cataract; PSC posterior subcapsular cataract; ERM epi-retinal membrane; PVD posterior vitreous detachment; RD retinal detachment; DM diabetes mellitus; DR diabetic retinopathy; NPDR non-proliferative diabetic retinopathy; PDR proliferative diabetic retinopathy; CSME clinically significant macular edema; DME diabetic macular edema; dbh dot blot hemorrhages; CWS cotton wool spot; POAG primary open angle glaucoma; C/D cup-to-disc ratio; HVF humphrey visual field; GVF goldmann visual field; OCT optical coherence tomography; IOP intraocular pressure; BRVO Branch retinal vein occlusion; CRVO central retinal vein occlusion; CRAO central retinal artery occlusion; BRAO branch retinal artery occlusion;  RT retinal tear; SB scleral buckle; PPV pars plana vitrectomy; VH Vitreous hemorrhage; PRP panretinal laser photocoagulation; IVK intravitreal kenalog ; VMT vitreomacular traction; MH Macular hole;  NVD neovascularization of the disc; NVE neovascularization elsewhere; AREDS age related eye disease study; ARMD age related macular degeneration; POAG primary open angle glaucoma; EBMD epithelial/anterior basement membrane dystrophy; ACIOL anterior chamber intraocular lens; IOL intraocular lens; PCIOL posterior chamber intraocular lens; Phaco/IOL phacoemulsification with intraocular lens placement; PRK photorefractive keratectomy; LASIK laser assisted in situ keratomileusis; HTN hypertension; DM diabetes mellitus; COPD chronic obstructive pulmonary disease

## 2024-04-05 ENCOUNTER — Encounter (INDEPENDENT_AMBULATORY_CARE_PROVIDER_SITE_OTHER): Admitting: Ophthalmology

## 2024-04-23 ENCOUNTER — Other Ambulatory Visit: Payer: Self-pay | Admitting: Internal Medicine

## 2024-04-23 DIAGNOSIS — E109 Type 1 diabetes mellitus without complications: Secondary | ICD-10-CM

## 2024-04-25 ENCOUNTER — Encounter (INDEPENDENT_AMBULATORY_CARE_PROVIDER_SITE_OTHER): Admitting: Ophthalmology

## 2024-04-29 ENCOUNTER — Ambulatory Visit: Admitting: Internal Medicine

## 2025-01-23 ENCOUNTER — Encounter: Admitting: Family Medicine
